# Patient Record
Sex: Female | Born: 1985 | Race: Black or African American | Hispanic: No | Marital: Single | Smoking: Former smoker
Health system: Southern US, Community
[De-identification: ages and names within clinical notes are randomized; demographics above are authoritative.]

## PROBLEM LIST (undated history)

## (undated) ENCOUNTER — Inpatient Hospital Stay (HOSPITAL_COMMUNITY): Payer: Self-pay

## (undated) DIAGNOSIS — F419 Anxiety disorder, unspecified: Secondary | ICD-10-CM

## (undated) DIAGNOSIS — A749 Chlamydial infection, unspecified: Secondary | ICD-10-CM

## (undated) DIAGNOSIS — A549 Gonococcal infection, unspecified: Secondary | ICD-10-CM

## (undated) DIAGNOSIS — F319 Bipolar disorder, unspecified: Secondary | ICD-10-CM

## (undated) DIAGNOSIS — F209 Schizophrenia, unspecified: Secondary | ICD-10-CM

## (undated) DIAGNOSIS — R87629 Unspecified abnormal cytological findings in specimens from vagina: Secondary | ICD-10-CM

## (undated) HISTORY — PX: DILATION AND CURETTAGE OF UTERUS: SHX78

## (undated) HISTORY — PX: COLPOSCOPY: SHX161

---
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
## 2007-05-31 ENCOUNTER — Inpatient Hospital Stay (HOSPITAL_COMMUNITY): Admission: AD | Admit: 2007-05-31 | Discharge: 2007-05-31 | Payer: Self-pay | Admitting: Obstetrics & Gynecology

## 2009-06-02 ENCOUNTER — Emergency Department (HOSPITAL_COMMUNITY): Admission: EM | Admit: 2009-06-02 | Discharge: 2009-06-02 | Payer: Self-pay | Admitting: Family Medicine

## 2009-11-24 ENCOUNTER — Emergency Department (HOSPITAL_COMMUNITY): Admission: EM | Admit: 2009-11-24 | Discharge: 2009-11-24 | Payer: Self-pay | Admitting: Family Medicine

## 2010-04-09 ENCOUNTER — Other Ambulatory Visit: Admission: RE | Admit: 2010-04-09 | Discharge: 2010-04-09 | Payer: Self-pay | Admitting: Family Medicine
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
## 2011-04-09 NOTE — Op Note (Signed)
NAMEANALIESE, KRUPKA            ACCOUNT NO.:  [id number]   MEDICAL RECORD NO.:  [id number]          PATIENT TYPE:  AMB   LOCATION:  SDC                           FACILITY:  [facility]   PHYSICIAN:  Genia Del, M.D.DATE OF BIRTH:  1986/09/10   DATE OF PROCEDURE:  05/19/2005  DATE OF DISCHARGE:                                 OPERATIVE REPORT   PREOPERATIVE DIAGNOSIS:  9+ weeks gestation, not desired.   POSTOPERATIVE DIAGNOSIS:  9+ weeks gestation, not desired.   INTERVENTION:  Dilatation and evacuation.   SURGEON:  Genia Del, M.D.   ANESTHESIA:  Belva Agee, M.D.   DESCRIPTION OF PROCEDURE:  Under MAC analgesia, the patient is in the  lithotomy position. She is prepped with Betadine on the suprapubic, vulvar,  and vaginal areas, and draped as usual.  The vaginal examination reveals an  anteverted uterus about 9 cm, mobile, no adnexal mass.  The cervix is long  and closed with no bleeding.  The speculum is inserted into the vagina.  The  anterior lip of the cervix is grasped with a tenaculum.  The paracervical  block is done with lidocaine 1% 20 mL total at 4 and 8 o'clock.  We then  dilated the cervix with Hegar dilators up to #31 without difficulty.  A #9  curved curet is used for suction.  Products of conception corresponding to  about 9 weeks are suctioned and sent to pathology.  We then used a sharp  curet to systematically curettage the intrauterine cavity and follow with  the suction once more to remove any products of conception or blood clots.  The uterus contracts well.  Hemostasis is adequate.  All instruments are  removed.  The estimated blood loss was 50 mL.  No complications occurred.  The patient was transferred in stable status to the recovery room.       ML/MEDQ  D:  05/19/2005  T:  05/19/2005  Job:  [id number]
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
## 2011-07-05 ENCOUNTER — Inpatient Hospital Stay (HOSPITAL_COMMUNITY): Payer: 59

## 2011-07-05 ENCOUNTER — Encounter (HOSPITAL_COMMUNITY): Payer: Self-pay | Admitting: *Deleted

## 2011-07-05 ENCOUNTER — Inpatient Hospital Stay (HOSPITAL_COMMUNITY)
Admission: AD | Admit: 2011-07-05 | Discharge: 2011-07-05 | Disposition: A | Discharge status: Home / Self Care | Payer: 59 | Source: Ambulatory Visit | Attending: Obstetrics & Gynecology | Admitting: Obstetrics & Gynecology

## 2011-07-05 DIAGNOSIS — Z3490 Encounter for supervision of normal pregnancy, unspecified, unspecified trimester: Secondary | ICD-10-CM

## 2011-07-05 DIAGNOSIS — Z1389 Encounter for screening for other disorder: Secondary | ICD-10-CM

## 2011-07-05 DIAGNOSIS — O99891 Other specified diseases and conditions complicating pregnancy: Secondary | ICD-10-CM | POA: Insufficient documentation

## 2011-07-05 LAB — URINALYSIS, ROUTINE W REFLEX MICROSCOPIC
Bilirubin Urine: NEGATIVE
Hgb urine dipstick: NEGATIVE
Ketones, ur: NEGATIVE mg/dL
Nitrite: NEGATIVE
Urobilinogen, UA: 1 mg/dL (ref 0.0–1.0)

## 2011-07-05 LAB — WET PREP, GENITAL
Clue Cells Wet Prep HPF POC: NONE SEEN
Trich, Wet Prep: NONE SEEN

## 2011-07-05 LAB — CBC
HCT: 36.5 % (ref 36.0–46.0)
MCH: 30.4 pg (ref 26.0–34.0)
MCV: 87.3 fL (ref 78.0–100.0)
Platelets: 135 10*3/uL — ABNORMAL LOW (ref 150–400)
RBC: 4.18 MIL/uL (ref 3.87–5.11)
WBC: 5.4 10*3/uL (ref 4.0–10.5)

## 2011-07-05 NOTE — ED Provider Notes (Signed)
Results for orders placed during the hospital encounter of 07/05/11 (from the past 24 hour(s))  URINALYSIS, ROUTINE W REFLEX MICROSCOPIC     Status: Normal   Collection Time   07/05/11 11:30 AM      Component Value Range   Color, Urine YELLOW  YELLOW    Appearance CLEAR  CLEAR    Specific Gravity, Urine 1.020  1.005 - 1.030    pH 7.5  5.0 - 8.0    Glucose, UA NEGATIVE  NEGATIVE (mg/dL)   Hgb urine dipstick NEGATIVE  NEGATIVE    Bilirubin Urine NEGATIVE  NEGATIVE    Ketones, ur NEGATIVE  NEGATIVE (mg/dL)   Protein, ur NEGATIVE  NEGATIVE (mg/dL)   Urobilinogen, UA 1.0  0.0 - 1.0 (mg/dL)   Nitrite NEGATIVE  NEGATIVE    Leukocytes, UA NEGATIVE  NEGATIVE   POCT PREGNANCY, URINE     Status: Normal   Collection Time   07/05/11 11:34 AM      Component Value Range   Preg Test, Ur POSITIVE    CBC     Status: Abnormal   Collection Time   07/05/11 11:45 AM      Component Value Range   WBC 5.4  4.0 - 10.5 (K/uL)   RBC 4.18  3.87 - 5.11 (MIL/uL)   Hemoglobin 12.7  12.0 - 15.0 (g/dL)   HCT 95.6  21.3 - 08.6 (%)   MCV 87.3  78.0 - 100.0 (fL)   MCH 30.4  26.0 - 34.0 (pg)   MCHC 34.8  30.0 - 36.0 (g/dL)   RDW 57.8  46.9 - 62.9 (%)   Platelets 135 (*) 150 - 400 (K/uL)  ABO/RH     Status: Normal   Collection Time   07/05/11 11:45 AM      Component Value Range   ABO/RH(D) O POS    HCG, QUANTITATIVE, PREGNANCY     Status: Abnormal   Collection Time   07/05/11 11:45 AM      Component Value Range   hCG, Beta Chain, Quant, S 52841 (*) <5 (mIU/mL)  WET PREP, GENITAL     Status: Abnormal   Collection Time   07/05/11  1:00 PM      Component Value Range   Yeast, Wet Prep NONE SEEN  NONE SEEN    Trich, Wet Prep NONE SEEN  NONE SEEN    Clue Cells, Wet Prep NONE SEEN  NONE SEEN    WBC, Wet Prep HPF POC TOO NUMEROUS TO COUNT (*) NONE SEEN     Pregnancy verification letter given. Discussed pregnancy options. Gave phone number for [facility] and [facility].   I supervised this patient's care and  agree with student's assessment and plan

## 2011-07-05 NOTE — Progress Notes (Signed)
Pt states she used the Plan B on 7-15. Has had abdominal pain, spotting on and off and nausea and vomiting for about one week. Did 4 HPT's yesterday that were POS.

## 2011-07-05 NOTE — ED Provider Notes (Signed)
History   Makayla Armstrong04-07-1986 07/05/11 12:07  Chief Complaint  Patient presents with   Abdominal Pain   HPI Comments: Ms. Makayla Medina is a 25 yo G3P1011 with a cc of abdominal pain.  The pain is located in her lower abdomen.  She states it started a week ago and describes it as sharp, immediate, and constant.  She rates it as 8-9/10 when it happens.  She has not done anything or taking any medications to alleviate it.  She is currently not in pain.  Her LMP was 05/26/11.  She also states she had light vaginal bleeding only for one day over a week ago.  Denies vaginal dc.  She states she has had HA and NV this week. She denies light headedness, dizziness, or RUQ.    Abdominal Pain Associated symptoms include headaches.     Past Medical History  Diagnosis Date   No pertinent past medical history     Past Surgical History  Procedure Date   Dilation and curettage of uterus     No family history on file.  History  Substance Use Topics   Smoking status: Never Smoker    Smokeless tobacco: Not on file   Alcohol Use: 0.6 oz/week    1 Glasses of wine per week    Allergies: Allergies not on file  No prescriptions prior to admission    Review of Systems  Eyes: Negative for blurred vision.  Gastrointestinal: Positive for abdominal pain.  Neurological: Positive for headaches. Negative for dizziness.  All other systems reviewed and are negative.   Physical Exam   Blood pressure 104/67, pulse 77, temperature 98.3 F (36.8 C), temperature source Oral, resp. rate 16, height 5\' 6"  (1.676 m), weight 65.681 kg (144 lb 12.8 oz), last menstrual period 05/26/2011, SpO2 100.00%.  Physical Exam  Constitutional: She is oriented to person, place, and time. She appears well-developed and well-nourished.  HENT:  Head: Normocephalic.  Neck: No thyromegaly present.  Cardiovascular: Normal rate, regular rhythm and normal heart sounds.   Respiratory: Effort normal and breath  sounds normal.  GI: Soft. She exhibits no distension. There is no rebound.  Genitourinary: Vaginal discharge found.       Large amount of white, mucous vaginal discharge  Musculoskeletal: She exhibits no edema and no tenderness.  Neurological: She is alert and oriented to person, place, and time.  Skin: Skin is warm and dry.    MAU Course  Procedures    Assessment and Plan  Positive pregnancy - U/S imaged IUP with yolk sac, FHR of 125, small subchorionic bleed.  EGA by U/S [redacted]w[redacted]d.  Will discharge with information on early pregnancy precautions and pregnancy verification letter. Pt also requests information on abortion options.    Payton Doughty PA-S 07/05/2011, 12:07 PM

## 2011-07-05 NOTE — ED Notes (Deleted)
Pt  Tearful, loss acknowledged.  CNM calling MD for plan.  FOB silent.

## 2011-07-06 LAB — GC/CHLAMYDIA PROBE AMP, GENITAL
Chlamydia, DNA Probe: NEGATIVE
GC Probe Amp, Genital: NEGATIVE

## 2011-09-07 LAB — URINALYSIS, ROUTINE W REFLEX MICROSCOPIC
Bilirubin Urine: NEGATIVE
Hgb urine dipstick: NEGATIVE
Ketones, ur: NEGATIVE
Nitrite: NEGATIVE
Protein, ur: NEGATIVE
Urobilinogen, UA: 2 — ABNORMAL HIGH

## 2011-10-01 ENCOUNTER — Encounter (HOSPITAL_COMMUNITY): Payer: Self-pay

## 2011-10-01 ENCOUNTER — Emergency Department (HOSPITAL_COMMUNITY)
Admission: EM | Admit: 2011-10-01 | Discharge: 2011-10-02 | Disposition: A | Discharge status: Other Acute Inpatient Hospital | Payer: 59 | Source: Home / Self Care | Attending: Emergency Medicine | Admitting: Emergency Medicine

## 2011-10-01 DIAGNOSIS — F29 Unspecified psychosis not due to a substance or known physiological condition: Secondary | ICD-10-CM

## 2011-10-01 LAB — URINALYSIS, ROUTINE W REFLEX MICROSCOPIC
Hgb urine dipstick: NEGATIVE
Nitrite: NEGATIVE
Specific Gravity, Urine: 1.021 (ref 1.005–1.030)
Urobilinogen, UA: 1 mg/dL (ref 0.0–1.0)
pH: 6.5 (ref 5.0–8.0)

## 2011-10-01 LAB — CBC
MCH: 28.4 pg (ref 26.0–34.0)
MCHC: 33.3 g/dL (ref 30.0–36.0)
MCV: 85.3 fL (ref 78.0–100.0)
Platelets: 208 10*3/uL (ref 150–400)
RDW: 13.2 % (ref 11.5–15.5)

## 2011-10-01 LAB — URINE MICROSCOPIC-ADD ON

## 2011-10-01 LAB — COMPREHENSIVE METABOLIC PANEL
ALT: 10 U/L (ref 0–35)
Albumin: 4.4 g/dL (ref 3.5–5.2)
Calcium: 10 mg/dL (ref 8.4–10.5)
GFR calc Af Amer: >90 mL/min (ref >90)
Glucose, Bld: 99 mg/dL (ref 70–99)
Potassium: 3.2 mEq/L — ABNORMAL LOW (ref 3.5–5.1)
Sodium: 138 mEq/L (ref 135–145)
Total Protein: 8.1 g/dL (ref 6.0–8.3)

## 2011-10-01 LAB — RAPID URINE DRUG SCREEN, HOSP PERFORMED
Cocaine: NOT DETECTED
Opiates: NOT DETECTED

## 2011-10-01 LAB — DIFFERENTIAL
Basophils Relative: 0 % (ref 0–1)
Eosinophils Absolute: 0 10*3/uL (ref 0.0–0.7)
Eosinophils Relative: 0 % (ref 0–5)
Lymphs Abs: 1 10*3/uL (ref 0.7–4.0)
Neutrophils Relative %: 78 % — ABNORMAL HIGH (ref 43–77)

## 2011-10-01 LAB — PREGNANCY, URINE: Preg Test, Ur: NEGATIVE

## 2011-10-01 LAB — ETHANOL: Alcohol, Ethyl (B): <11 mg/dL (ref 0–11)

## 2011-10-01 MED ORDER — LORAZEPAM 1 MG PO TABS
1.0000 mg | ORAL_TABLET | Freq: Three times a day (TID) | ORAL | Status: DC | PRN
  Administered 2011-10-02: 1 mg via ORAL
  Filled 2011-10-01 (×2): qty 1

## 2011-10-01 MED ORDER — ZOLPIDEM TARTRATE 5 MG PO TABS: 10.0000 mg | ORAL_TABLET | Freq: Every evening | ORAL | Status: DC | PRN

## 2011-10-01 MED ORDER — ACETAMINOPHEN 325 MG PO TABS: 650.0000 mg | ORAL_TABLET | ORAL | Status: DC | PRN

## 2011-10-01 MED ORDER — NICOTINE 21 MG/24HR TD PT24: 21.0000 mg | MEDICATED_PATCH | Freq: Every day | TRANSDERMAL | Status: DC

## 2011-10-01 MED ORDER — ONDANSETRON HCL 4 MG PO TABS: 4.0000 mg | ORAL_TABLET | Freq: Three times a day (TID) | ORAL | Status: DC | PRN

## 2011-10-01 MED ORDER — IBUPROFEN 200 MG PO TABS: 600.0000 mg | ORAL_TABLET | Freq: Three times a day (TID) | ORAL | Status: DC | PRN

## 2011-10-01 MED ORDER — ALUM & MAG HYDROXIDE-SIMETH 200-200-20 MG/5ML PO SUSP: 30.0000 mL | ORAL | Status: DC | PRN

## 2011-10-01 NOTE — ED Notes (Signed)
Sissy Hoff (Mother) [phone]
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
## 2011-10-01 NOTE — ED Notes (Signed)
Pt requesting to smoke, offered nicotine patch, pt declined. Awaiting BH assessment.

## 2011-10-01 NOTE — ED Notes (Signed)
ACT at bedside, pt continues to wander

## 2011-10-01 NOTE — ED Notes (Signed)
ems wwas called by [employer], pt was climbing on cars and screaming things out
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
## 2011-10-01 NOTE — ED Notes (Signed)
Family at bedside. 

## 2011-10-01 NOTE — ED Notes (Signed)
Pt. Escorted to restroom. Unable to void. Gave oral fluids. Will revisit in 10 mins.

## 2011-10-01 NOTE — ED Notes (Signed)
Pt. Currently on phone with mother. RN Autumn notified.

## 2011-10-01 NOTE — ED Notes (Signed)
Pt admits to taking three ecstasy pills and drinking heavily today, family states that she started hearing voices about three days ago. EMS said that she was talking about cutting her throat and sacrificing her daughter, ems gave her versed, pt is now calm

## 2011-10-01 NOTE — ED Notes (Signed)
Patient is resting comfortably. 

## 2011-10-01 NOTE — ED Notes (Signed)
Pt now awake, tearful, talking to NT, pt up to BR with steady gait.

## 2011-10-01 NOTE — ED Notes (Signed)
Pt requesting food, pt given sandwich and drink, female at bedside.

## 2011-10-01 NOTE — ED Notes (Signed)
UJW:[id number]<WG> Expected date:10/01/11<BR> Expected time: 3:58 PM<BR> Means of arrival:Ambulance<BR> Comments:<BR> 25 Psychosis/admits to ecstasy use

## 2011-10-01 NOTE — ED Notes (Signed)
Pt on telephone with mother at this time

## 2011-10-01 NOTE — ED Notes (Signed)
Pt again questioning wait, explained need for evaluation by ACT.

## 2011-10-02 ENCOUNTER — Inpatient Hospital Stay (HOSPITAL_COMMUNITY)
Admission: EM | Admit: 2011-10-02 | Discharge: 2011-10-08 | DRG: 885 | Disposition: A | Discharge status: Home / Self Care | Payer: 59 | Source: Ambulatory Visit | Attending: Psychiatry | Admitting: Psychiatry

## 2011-10-02 DIAGNOSIS — F1994 Other psychoactive substance use, unspecified with psychoactive substance-induced mood disorder: Secondary | ICD-10-CM

## 2011-10-02 DIAGNOSIS — F1210 Cannabis abuse, uncomplicated: Secondary | ICD-10-CM

## 2011-10-02 DIAGNOSIS — F29 Unspecified psychosis not due to a substance or known physiological condition: Principal | ICD-10-CM | POA: Diagnosis present

## 2011-10-02 MED ORDER — RISPERIDONE 1 MG PO TBDP
1.0000 mg | ORAL_TABLET | Freq: Two times a day (BID) | ORAL | Status: DC | PRN
  Administered 2011-10-02 – 2011-10-06 (×7): 1 mg via ORAL
  Filled 2011-10-02 (×8): qty 1

## 2011-10-02 NOTE — ED Notes (Signed)
Report given to Saundra, RN.

## 2011-10-02 NOTE — ED Notes (Signed)
Pt changed in to scrubs, pt and clothing wanded, belongings at nurses station, pt continues to wander around room, mumbling about God.

## 2011-10-02 NOTE — Progress Notes (Signed)
[facility] Group Notes:  (Counselor/Nursing/MHT/Case Management/Adjunct)  10/02/2011 11 AM  Type of Therapy:  Counseling Group / Dance/Movement Therapy  Participation Level:  Did Not Attend  Kym Groom 10/02/2011, 11:58 AM

## 2011-10-02 NOTE — ED Notes (Signed)
Per admission in [facility], Mr. Makayla Medina said that they will contact us back once [name] approves transport

## 2011-10-02 NOTE — Progress Notes (Signed)
Pt.  attended and participated in aftercare planning group. Suicide prevention information, warning signs to look for with suicide and crisis line numbers to use.  Pt. was crying and very emotional during the group. Unable to understand some of her conversations.
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
## 2011-10-02 NOTE — ED Notes (Signed)
Pt continues to occasionally come to desk. Awaiting BH admission

## 2011-10-02 NOTE — ED Notes (Signed)
Changed status from discharge to transfer d/t pt was transferred to [facility].

## 2011-10-02 NOTE — ED Notes (Signed)
BH contacted, report given to the RN, pt getting ready for the transport

## 2011-10-02 NOTE — Progress Notes (Signed)
Suicide Risk Assessment  Admission Assessment     Demographic factors:  Assessment Details Time of Assessment: Admission Information Obtained From: Patient Current Mental Status:    Loss Factors:  Loss Factors: Loss of significant relationship Historical Factors:    Risk Reduction Factors:  Risk Reduction Factors: Sense of responsibility to family;Employed;Positive social support  CLINICAL FACTORS:   Depression:   Comorbid alcohol abuse/dependence Alcohol/Substance Abuse/Dependencies Previous Psychiatric Diagnoses and Treatments  COGNITIVE FEATURES THAT CONTRIBUTE TO RISK:  Polarized thinking    SUICIDE RISK:   Minimal: No identifiable suicidal ideation.  Patients presenting with no risk factors but with morbid ruminations; may be classified as minimal risk based on the severity of the depressive symptoms  PLAN OF CARE: Patient admitted to [facility] with the urine drug screen positive for the benzodiazepines and marijuana and also reportedly using ecstasy. Patient was unable to provide a linear history during this evaluation. Patient was treated in the past. Patient will be receiving the antipsychotic medication Risperdal 1 mg 2 times a day as needed for agitation and aggressive behavior. Patient will be closely monitored for the withdrawal  symptoms of benzodiazepines.    Makayla Medina,JANARDHAHA R. 10/02/2011, 2:42 PM

## 2011-10-02 NOTE — Progress Notes (Addendum)
10/02/2011 @2145 : Pt resting at start of shift. She is now up and moving about the milieu with a suspicious, worried affect. She will write something in her journal, move quickly across the dayroom and then will write again. She denies any hallucinations but appears to respond to internal stimuli. She is childlike in her interactions which are minimal though pt is cooperative on approach. Supported, given reassurance of safety on unit. Pt does not report any needs at this time. Will continue to monitor closely. Edsel Petrin RN 10/03/2011 @0600 : Pt awoke at 0100 coughing and remains confused. Observed in hall at times studying the map on wall, wandering. Pt's cough is dry in quality and she denies any associated pain. Pt given fluids and encouraged to push. She asks for and is given Risperdal 1mg  M-tab to help her settle and rest. She was able to return to sleep within the hour however has had a few coughing spells since. Will ask MD to assess. Edsel Petrin RN

## 2011-10-02 NOTE — ED Notes (Signed)
Pt transported from acute side to the TCU, pt assessed, pt crying at this time, pt state she is hearing voices, when asked to explain, pt further states " They telling me to hurt my self and others" Pt asking for the phone, states needs to know about her nephew. At this time pt appears to be emotionally upset. NT in the room obtaining VS.

## 2011-10-02 NOTE — BH Assessment (Signed)
Assessment Note   Makayla Medina is an 25 y.o. female.   Axis I: Psychotic Disorder NOS  Past Medical History:  Past Medical History  Diagnosis Date  . No pertinent past medical history     Past Surgical History  Procedure Date  . Dilation and curettage of uterus     Family History: History reviewed. No pertinent family history.  Social History:  reports that she has never smoked. She does not have any smokeless tobacco history on file. She reports that she drinks about .6 ounces of alcohol per week. She reports that she uses illicit drugs (Marijuana).  Allergies: No Known Allergies  Home Medications:  Medications Prior to Admission  Medication Dose Route Frequency Provider Last Rate Last Dose  . acetaminophen (TYLENOL) tablet 650 mg  650 mg Oral Q4H PRN Makayla Givens, MD      . alum & mag hydroxide-simeth (MAALOX/MYLANTA) 200-200-20 MG/5ML suspension 30 mL  30 mL Oral PRN Makayla Givens, MD      . ibuprofen (ADVIL,MOTRIN) tablet 600 mg  600 mg Oral Q8H PRN Makayla Givens, MD      . LORazepam (ATIVAN) tablet 1 mg  1 mg Oral Q8H PRN Makayla Givens, MD   1 mg at 10/02/11 0158  . nicotine (NICODERM CQ - dosed in mg/24 hours) patch 21 mg  21 mg Transdermal Daily Makayla Givens, MD      . ondansetron (ZOFRAN) tablet 4 mg  4 mg Oral Q8H PRN Makayla Givens, MD      . zolpidem (AMBIEN) tablet 10 mg  10 mg Oral QHS PRN Makayla Givens, MD       No current outpatient prescriptions on file as of 10/01/2011.    OB/GYN Status:  Patient's last menstrual period was 05/26/2011.  General Assessment Data Living Arrangements: Alone;Children Can pt return to current living arrangement?: Yes Admission Status: Voluntary Is patient capable of signing voluntary admission?: Yes Transfer from: Acute Hospital Referral Source: Self/Family/Friend  Risk to self Suicidal Ideation: Yes-Currently Present Suicidal Intent: Yes-Currently Present Is patient at risk for suicide?: Yes Suicidal Plan?: Yes-Currently  Present Specify Current Suicidal Plan:  (Pt. stated she "was going to take 13 pills") Access to Means: Yes Specify Access to Suicidal Means:  (Pt. can obtain medications from the community.) What has been your use of drugs/alcohol within the last 12 months?: Alcohol and THC Other Self Harm Risks:  (None reported) Triggers for Past Attempts: Unknown Intentional Self Injurious Behavior: None Factors that decrease suicide risk: Positive social support (Sense of responsibiltiy to faimly) Family Suicide History: Unknown Recent stressful life event(s): Other (Comment) (Current drug  use "Ecstasy" ) Persecutory voices/beliefs?: Yes (Hurt herself) Depression: Yes Depression Symptoms: Tearfulness;Feeling angry/irritable;Loss of interest in usual pleasures Suicide prevention information given to non-admitted patients: Yes  Risk to Others Homicidal Ideation: No Thoughts of Harm to Others: No Current Homicidal Intent: No Current Homicidal Plan: No Access to Homicidal Means: No Identified Victim:  (NA) History of harm to others?: No Assessment of Violence: On admission (Pt. was brought to the ED in restraints by [employer]) Does patient have access to weapons?: No Criminal Charges Pending?: No Does patient have a court date: No  Mental Status Report Eye Contact: Poor Motor Activity: Restlessness Speech: Rapid;Slurred;Incoherent;Aphasic Level of Consciousness: Irritable;Alert Mood: Suspicious;Anxious;Preoccupied;Depressed Affect: Apathetic;Depressed;Fearful;Inconsistent with thought content;Preoccupied Anxiety Level: Moderate Thought Processes: Irrelevant;Flight of Ideas Judgement: Impaired Orientation: Person;Place;Time;Situation Obsessive Compulsive Thoughts/Behaviors: Moderate  Cognitive Functioning Concentration: Decreased Memory: Recent Intact;Remote Intact  IQ: Average Insight: Poor Impulse Control: Fair Appetite: Good Weight Loss:  (na) Weight Gain:  (na) Sleep: Decreased Total  Hours of Sleep:  (Pt. didn't answer) Vegetative Symptoms: None  Prior Inpatient/Outpatient Therapy Prior Therapy: Inpatient Prior Therapy Dates: 2006 Prior Therapy Facilty/Provider(s): [facility] Reason for Treatment: Suicide attempt          Abuse/Neglect Assessment (Assessment to be complete while patient is alone) Physical Abuse: Denies Verbal Abuse: Denies Sexual Abuse: Denies Exploitation of patient/patient's resources: Denies Self-Neglect: Denies Values / Beliefs Cultural Requests During Hospitalization: None Spiritual Requests During Hospitalization: Religious literature (Pt. requested a Bible earlier in the day) Consults Spiritual Care Consult Needed: No Social Work Consult Needed: No      Additional Information 1:1 In Past 12 Months?: No CIRT Risk: No Elopement Risk: No Does patient have medical clearance?: Yes  Child/Adolescent Assessment Running Away Risk: Denies Bed-Wetting: Denies Destruction of Property: Denies Cruelty to Animals: Denies Stealing: Denies Rebellious/Defies Authority: Denies Satanic Involvement: Denies Archivist: Denies Problems at [employer]: Denies Gang Involvement: Denies  Disposition: Pt. Was accepted to [facility] by Makayla Medina to Makayla Medina and her bed number is 400-2 Disposition Disposition of Patient: Inpatient treatment program Type of inpatient treatment program: Adult  On Site Evaluation by:   Reviewed with Physician:     Makayla Medina 10/02/2011 3:44 AM
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
## 2011-10-02 NOTE — H&P (Signed)
Makayla Medina is an 25 y.o. female.   Chief Complaint: altered mental status  HPI: Came to the ED and can't answer questions at this time. Was reported to have taken ecstasy and appears to be having a "bad trip".  Past Medical History  Diagnosis Date  . No pertinent past medical history     Past Surgical History  Procedure Date  . Dilation and curettage of uterus     No family history on file. Social History:  reports that she has never smoked. She does not have any smokeless tobacco history on file. She reports that she drinks about .6 ounces of alcohol per week. She reports that she uses illicit drugs (Marijuana).  Allergies: No Known Allergies  Medications Prior to Admission  Medication Dose Route Frequency Provider Last Rate Last Dose  . DISCONTD: acetaminophen (TYLENOL) tablet 650 mg  650 mg Oral Q4H PRN Ward Givens, MD      . DISCONTD: alum & mag hydroxide-simeth (MAALOX/MYLANTA) 200-200-20 MG/5ML suspension 30 mL  30 mL Oral PRN Ward Givens, MD      . DISCONTD: ibuprofen (ADVIL,MOTRIN) tablet 600 mg  600 mg Oral Q8H PRN Ward Givens, MD      . DISCONTD: LORazepam (ATIVAN) tablet 1 mg  1 mg Oral Q8H PRN Ward Givens, MD   1 mg at 10/02/11 0158  . DISCONTD: nicotine (NICODERM CQ - dosed in mg/24 hours) patch 21 mg  21 mg Transdermal Daily Ward Givens, MD      . DISCONTD: ondansetron (ZOFRAN) tablet 4 mg  4 mg Oral Q8H PRN Ward Givens, MD      . DISCONTD: zolpidem (AMBIEN) tablet 10 mg  10 mg Oral QHS PRN Ward Givens, MD       Medications Prior to Admission  Medication Sig Dispense Refill  . midazolam (VERSED) 2 MG/ML syrup Take 5 mg by mouth once.         Results for orders placed during the hospital encounter of 10/01/11 (from the past 48 hour(s))  CBC     Status: Normal   Collection Time   10/01/11  5:35 PM      Component Value Range Comment   WBC 8.5  4.0 - 10.5 (K/uL)    RBC 4.50  3.87 - 5.11 (MIL/uL)    Hemoglobin 12.8  12.0 - 15.0 (g/dL)    HCT 16.1  09.6 - 04.5  (%)    MCV 85.3  78.0 - 100.0 (fL)    MCH 28.4  26.0 - 34.0 (pg)    MCHC 33.3  30.0 - 36.0 (g/dL)    RDW 40.9  81.1 - 91.4 (%)    Platelets 208  150 - 400 (K/uL)   DIFFERENTIAL     Status: Abnormal   Collection Time   10/01/11  5:35 PM      Component Value Range Comment   Neutrophils Relative 78 (*) 43 - 77 (%)    Neutro Abs 6.6  1.7 - 7.7 (K/uL)    Lymphocytes Relative 12  12 - 46 (%)    Lymphs Abs 1.0  0.7 - 4.0 (K/uL)    Monocytes Relative 10  3 - 12 (%)    Monocytes Absolute 0.8  0.1 - 1.0 (K/uL)    Eosinophils Relative 0  0 - 5 (%)    Eosinophils Absolute 0.0  0.0 - 0.7 (K/uL)    Basophils Relative 0  0 - 1 (%)    Basophils  Absolute 0.0  0.0 - 0.1 (K/uL)   COMPREHENSIVE METABOLIC PANEL     Status: Abnormal   Collection Time   10/01/11  5:35 PM      Component Value Range Comment   Sodium 138  135 - 145 (mEq/L)    Potassium 3.2 (*) 3.5 - 5.1 (mEq/L)    Chloride 102  96 - 112 (mEq/L)    CO2 25  19 - 32 (mEq/L)    Glucose, Bld 99  70 - 99 (mg/dL)    BUN 10  6 - 23 (mg/dL)    Creatinine, Ser 7.82  0.50 - 1.10 (mg/dL)    Calcium 95.6  8.4 - 10.5 (mg/dL)    Total Protein 8.1  6.0 - 8.3 (g/dL)    Albumin 4.4  3.5 - 5.2 (g/dL)    AST 19  0 - 37 (U/L)    ALT 10  0 - 35 (U/L)    Alkaline Phosphatase 67  39 - 117 (U/L)    Total Bilirubin 0.5  0.3 - 1.2 (mg/dL)    GFR calc non Af Amer 86 (*) >90 (mL/min)    GFR calc Af Amer >90  >90 (mL/min)   ETHANOL     Status: Normal   Collection Time   10/01/11  5:35 PM      Component Value Range Comment   Alcohol, Ethyl (B) <11  0 - 11 (mg/dL)   URINALYSIS, ROUTINE W REFLEX MICROSCOPIC     Status: Abnormal   Collection Time   10/01/11  6:14 PM      Component Value Range Comment   Color, Urine YELLOW  YELLOW     Appearance CLOUDY (*) CLEAR     Specific Gravity, Urine 1.021  1.005 - 1.030     pH 6.5  5.0 - 8.0     Glucose, UA NEGATIVE  NEGATIVE (mg/dL)    Hgb urine dipstick NEGATIVE  NEGATIVE     Bilirubin Urine NEGATIVE  NEGATIVE      Ketones, ur 40 (*) NEGATIVE (mg/dL)    Protein, ur 213 (*) NEGATIVE (mg/dL)    Urobilinogen, UA 1.0  0.0 - 1.0 (mg/dL)    Nitrite NEGATIVE  NEGATIVE     Leukocytes, UA NEGATIVE  NEGATIVE    PREGNANCY, URINE     Status: Normal   Collection Time   10/01/11  6:14 PM      Component Value Range Comment   Preg Test, Ur NEGATIVE     URINE MICROSCOPIC-ADD ON     Status: Abnormal   Collection Time   10/01/11  6:14 PM      Component Value Range Comment   Squamous Epithelial / LPF RARE  RARE     WBC, UA 0-2  <3 (WBC/hpf)    Bacteria, UA FEW (*) RARE     Urine-Other MUCOUS PRESENT     URINE RAPID DRUG SCREEN (HOSP PERFORMED)     Status: Abnormal   Collection Time   10/01/11  6:32 PM      Component Value Range Comment   Opiates NONE DETECTED  NONE DETECTED     Cocaine NONE DETECTED  NONE DETECTED     Benzodiazepines POSITIVE (*) NONE DETECTED     Amphetamines NONE DETECTED  NONE DETECTED     Tetrahydrocannabinol POSITIVE (*) NONE DETECTED     Barbiturates NONE DETECTED  NONE DETECTED     No results found.  Review of Systems  Constitutional: Negative.   HENT: Negative.  Eyes: Negative.   Respiratory: Negative.   Cardiovascular: Negative.   Gastrointestinal: Negative.   Genitourinary: Negative.   Musculoskeletal: Negative.   Skin: Negative.   Psychiatric/Behavioral: Positive for hallucinations.    Last menstrual period 05/26/2011. Physical Exam  Reviewed exam from ED Labs showed neg pregnancy and pops THC and benzoes also said she had used Ecstasy Is responding to internal stimuli as well as the leaves falling. Does not answer questions just spouts a lot of end of the world type things.   Assessent/Plan Observe and provide supportive care 10/02/2011, 1:35 PM

## 2011-10-02 NOTE — Progress Notes (Signed)
Pt is 25 year old female admitted with psychotic symptoms and depression   She is very anxious and tearful   Walks around the room crying out   She asks the same question over and over again   She is tearful and religiously focused   She needs frequent redirection and reassurance   She is having much difficulty answering questions during assessment  And is having flight of ideas   Verbal support given  Oriented to the unit   Will administer medications as ordered and educate on same and evaluate for theraputic effect    Provide education on diet, activities, hyegine ,and ADL's   Q 15 min checks   Pt safe at present

## 2011-10-03 MED ORDER — RISPERIDONE 1 MG PO TBDP
1.0000 mg | ORAL_TABLET | Freq: Every day | ORAL | Status: DC
  Administered 2011-10-03 – 2011-10-04 (×2): 1 mg via ORAL
  Filled 2011-10-03 (×3): qty 1

## 2011-10-03 NOTE — Progress Notes (Signed)
10/03/11 @2300 : Pt continues to remain restless, intrusive at times and bizarre. She is asking repeatedly about discharge. Her interactions are superficial and childlike at times. She continues to deny any problems - AVH, SI/HI, pain - stating "everything is fine ma'am." She is observed however responding to internal stimuli and remains disorganized. Risperdal M-tab 1mg  ordered today along with her prn doses. Given at 2200. Level III obs continued and redirection and support given as needed. Pt is safe. Edsel Petrin RN  10/04/11 @0630 : Pt in hallway and pushed CIRT button at about 2420. Observed at end of hallway looking through exit door.  She then went into her bathroom and pushed her nurse call. She denies needing anything but is clearly anxious and restless. Risperdal M-tab 1mg  given for agitation @2430 . On R/A pt was calmer, resting in bed. However, pt was up frequently in room coughing. Fluids given and encouraged. Pt slept about 4.5 hours and is presently showering.  Edsel Petrin RN

## 2011-10-03 NOTE — Progress Notes (Signed)
[facility] Group Notes:  (Counselor/Nursing/MHT/Case Management/Adjunct)  10/03/2011 11:00AM  Type of Therapy:  Counseling Group / Dance/Movement Therapy  Participation Level:  Minimal  Participation Quality:  Redirectable and Sharing  Affect:  Excited and Tearful  Cognitive:  Oriented  Insight:  Limited  Engagement in Group:  Limited  Engagement in Therapy:  Limited  Modes of Intervention:  Activity, Clarification, Education, Socialization and Support  Summary of Progress/Problems: Group discussed different types of supports and how to rely on those sources when feeling stuck. Pt. Identified her twin sister as an external support because she is with pt. Through good and bad times. Pt. left group for a short time, but she came back. Group also practiced how to support themselves at any moment by bringing awareness of what their body was doing and/or how their body was feeling. Pt. Appeared "spacey" because her speech and the quality of her movement was soft and indirect suggesting disorganization in thought and presence.    Byrant Valent 10/03/2011, 12:11 PM

## 2011-10-03 NOTE — Progress Notes (Signed)
Pt has been flighty and impulsive today    She packed her belongings up and demanded to be let off the unit and said that God told her she was to be discharged from the hospital   She is seen in the hallways and her room responding to internal stimuli  Her anxiety level has decreased form yesterday and since she has received medication   Pt has a short attention span and has difficulty sitting through groups   Verbal support given  Medications administered and effectiveness monitored  Educate on medications, discharge planning ,diet and treatment procedures  Provide pt with diversional activities   Q 15 min checks  Pt safe at present

## 2011-10-03 NOTE — Progress Notes (Signed)
  Makayla Medina is in bed. Tells me she had taken some ecstasy prior to this admission. She states she is feeling better but unable to provide more details. She denies any suicidal or homicidal thinking or psychotic symptoms. She has her eyes closed and soft spoken. She had received Risperdal last evening for psychotic symptoms.  Plan: Risperdal 1mg  QHS. TSH. Reality testing . Monitor behavior

## 2011-10-04 DIAGNOSIS — F29 Unspecified psychosis not due to a substance or known physiological condition: Principal | ICD-10-CM

## 2011-10-04 NOTE — Progress Notes (Signed)
[facility] Group Notes:  (Counselor/Nursing/MHT/Case Management/Adjunct)  10/04/2011 2:43 PM  Type of Therapy:  Group Therapy  Participation Level:  Active  Participation Quality:  Attentive and Sharing  Affect:  Depressed  Cognitive:  Confused  Insight:  Limited  Engagement in Group:  Good  Engagement in Therapy:  Good  Modes of Intervention:  Clarification, Education, Support and Exploration  Summary of Progress/Problems: Patient was clear through most of session. She stated that she had been hearing voices and this is what caused her to go running through the complex. She stated that she thought she had to get votes to be the 2012 model of the year. She remembers parts of what she was doing but knows that it was not logical. Describes it as a break down. She has only recently started hearing voices. Stated they have decreased since being in the hospital. Concerned that she was doing all this with her 40 year old daughter and that she might have scared her. She is not worried about daughter currently because she knows she is being taken care of by grandparents. Wants to work on herself. The previous information was oriented but she again started talking about the 2013 model situation as if it were real. Agreeable to taking medications.    HartisAram Beecham 10/04/2011, 2:43 PM

## 2011-10-04 NOTE — Progress Notes (Signed)
  Pt was pleasant and cooperative, during the assessment process.  Pt was however pacing the room while holding the bible. Informed the writer that she was reading Revelations and which chapter she was on.  Later pt came into the dayroom walking fast. Asked the writer if Jews, Hebrews and Gentiles are the same. Writer informed the pt that Clinical research associate would not be able to discuss religion, because interpretations are different. Pt accepted this explanation without incidence.

## 2011-10-04 NOTE — Progress Notes (Signed)
Patient is still guarded, responding to inner stimuli, depressed,unable to discuss her issues but reported decrease in voices and meds helping her, her affect is constricted. No side effects reported.
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
## 2011-10-04 NOTE — Discharge Planning (Signed)
Met with patient in Aftercare Planning Group, where she was quiet, cooperative, but did keep moving from one chair to another throughout group.  She reports that she lives with her mother, brother, and sister.  She has never had a psychiatrist, and would be very interested in counseling.  Referral to be explored.  Supposed to start a job this coming Wednesday at [employer] so may need help contacting them if still in hospital.  Supposed to have attorney appointment Tuesday at [employer].  Case Manager supplied her with the telephone number so she can contact them.  There are no specific case management needs today.  Ambrose Mantle, LCSW 10/04/2011, 12:08 PM

## 2011-10-05 LAB — TSH: TSH: 0.337 u[IU]/mL — ABNORMAL LOW (ref 0.350–4.500)

## 2011-10-05 MED ORDER — GUAIFENESIN 100 MG/5ML PO SOLN
10.0000 mL | Freq: Four times a day (QID) | ORAL | Status: DC | PRN
  Administered 2011-10-05 – 2011-10-08 (×7): 200 mg via ORAL

## 2011-10-05 MED ORDER — MAGNESIUM HYDROXIDE 400 MG/5ML PO SUSP: 30.0000 mL | Freq: Every day | ORAL | Status: DC | PRN

## 2011-10-05 MED ORDER — ACETAMINOPHEN 325 MG PO TABS
650.0000 mg | ORAL_TABLET | Freq: Four times a day (QID) | ORAL | Status: DC | PRN
Start: 2011-10-05 — End: 2011-10-08

## 2011-10-05 MED ORDER — RISPERIDONE 2 MG PO TBDP
3.0000 mg | ORAL_TABLET | Freq: Every day | ORAL | Status: DC
  Administered 2011-10-05 – 2011-10-07 (×3): 3 mg via ORAL
  Filled 2011-10-05 (×4): qty 1

## 2011-10-05 MED ORDER — ALUM & MAG HYDROXIDE-SIMETH 200-200-20 MG/5ML PO SUSP: 30.0000 mL | ORAL | Status: DC | PRN

## 2011-10-05 NOTE — Progress Notes (Signed)
ON 10/05/2011  As per RN Pt was  pacing the room while holding the bible. Informed the Clinical research associate that she was reading Revelations and which chapter she was on  And asked the RN if Jews, Hebrews and Gentiles are the same. Increased her Risperdal to 3mg  po qhs Patient affect is better today she can make conversation,denies AVH but still grandiose,less disorganized Denies S/H ideations. Slept fairly at night.  C/O cough will give her Robitussin

## 2011-10-05 NOTE — Progress Notes (Signed)
  Pt was pleasant and cooperative but slightly guarded during the assessment.  However, during group the pt opened up more. Explained that today was a good day because two things happened. 1) she "dropped her boyfriend". 2) her mom and sister visited and together they made plans for pt after discharge.  Pt stated that she plans to go back to school, and study cultural anthropology. Stated she attended college for 3 yrs in [place], but plans to transfer to [employer]. Pt does have a 44yr daughter who she plans to visit in Dec
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
## 2011-10-05 NOTE — Discharge Planning (Signed)
Met with Patient in Aftercare Planning Group.  She got up at one point to spit up into a trashcan.  She complained of not sleeping well due to coughing.  Was softspoken, cooperative.  Had not yet called [employer] re missing appt today, but states will call today.  Signed consent for Case Manager to contact new employer re missing orientation which starts tomorrow 11/14.  Case Manager did this, and this will not affect her employment there, as the orientation can be rescheduled.  Patient states we can be in contact with her mother Sissy Hoff [phone].  Ambrose Mantle, LCSW 10/05/2011, 9:29 AM

## 2011-10-05 NOTE — Progress Notes (Signed)
Pt was in her room upon first assessment.  She was given her self-inventory to fill out which she rated both depression and hopelessness a 1 today.  She denied any voices this morning but is still religiously preoccupied at times and appears anxious.  She did write that she "planned to pray every time I'm feeling like I can't do this"  In response to what do you plan to change/or do different when you go home to take better care of yourself.  She has been up for her groups today and has interacted fairly well.  She is still restless in groups and does move from place to place or will either get coffee or water to drink.  She does this even after she has been told the group rule about distractions and so on.  She came back from lunch and asked for medication to "go to sleep"  reminded her it was 1250 in the afternoon  and group would be starting in about 30 minutes.  She seem anxious but was not ready for a prn dose of risperdal she has only one left for the day.  Just got off the phone with Veto Kemps the counselor and had been speaking with pt right before the 1315 group.  Aram Beecham said that pt does not want her boyfriend to visit anymore as she is not sure if she wants to continue their relationship.  Informed Aram Beecham that this Clinical research associate believes that anyone with her code would be allowed to visit and her code may need to be changed.  Call placed to the [facility] the CM and she is going to find out how to handle the situation.  Pt does have new orders to increase her risperdal to 3 mg at hs and robitussin for cough prn which was given around 1155.

## 2011-10-05 NOTE — Progress Notes (Signed)
[facility] Group Notes:  (Counselor/Nursing/MHT/Case Management/Adjunct)  10/05/2011 1:54 PM  Type of Therapy:  Group Therapy  Participation Level:  Active  Participation Quality:  Attentive and Sharing  Affect:  Depressed  Cognitive:  Confused  Insight:  Limited  Engagement in Group:  Good  Engagement in Therapy:  Good  Modes of Intervention:  Clarification, Education, Orientation, Support and Exploration  Summary of Progress/Problems: Patient stated that she was still hearing voices but they were decreased and she felt she had more control. Stated that she feels better with more purpose. She is less stressed over financial concerns and feels that she is more directed. She talked about just walking around or driving around with no purpose. Now she can do things with more intent. Reviewed with her need to take her medications for clearer thinking and more reality based.   Stepahnie Campo, Aram Beecham 10/05/2011, 1:54 PM

## 2011-10-06 LAB — TSH: TSH: 0.401 u[IU]/mL (ref 0.350–4.500)

## 2011-10-06 NOTE — Progress Notes (Signed)
  Makayla Medina is a 25 y.o. female [id number] [birth year]  Subjective/Objective: Patient is doing better.  She denies audiovisual hallucinations.  She denies suicidal/homicidal ideation.  She is less disorganized but she still has grandiose delusions and is religiously preoccupied.  She is still thinking that she is here for the Rapture.  No agitation reported by the staff.  She is participating in the groups.  She got a PRN for sleep last night.    History   Social History  . Marital Status: Single    Spouse Name: N/A    Number of Children: N/A  . Years of Education: N/A   Social History Main Topics  . Smoking status: Never Smoker   . Smokeless tobacco: Not on file  . Alcohol Use: 0.6 oz/week    1 Glasses of wine per week  . Drug Use: Yes    Special: Marijuana  . Sexually Active: Yes   Other Topics Concern  . Not on file   Social History Narrative  . No narrative on file     Lab Results:   BMET    Component Value Date/Time   NA 138 10/01/2011 1735   K 3.2* 10/01/2011 1735   CL 102 10/01/2011 1735   CO2 25 10/01/2011 1735   GLUCOSE 99 10/01/2011 1735   BUN 10 10/01/2011 1735   CREATININE 0.92 10/01/2011 1735   CALCIUM 10.0 10/01/2011 1735   GFRNONAA 86* 10/01/2011 1735   GFRAA >90 10/01/2011 1735    Medications:  Scheduled:     . risperiDONE  3 mg Oral QHS     PRN Meds acetaminophen, alum & mag hydroxide-simeth, guaiFENesin, magnesium hydroxide, risperiDONE   Plan: No side effects reported from Risperdal.  We will repeat her potassium levels in the AM.  ELOS 2-3 days.     AHLUWALIA,SHAMSHER S 10/06/2011

## 2011-10-06 NOTE — Progress Notes (Signed)
Pt was in dayroom upon first assessment this morning lying on the sofa with her pillow and cover.  She had just came back from breakfast.  She was given her self-inventory to fill out.  She rated both her depression and hopelessness a 1 today.  She denies any A/V hallucinations or S/H ideation.   She did writ on there that she planned "stay on out-patient treatment plan to help make sure I don't have another breakdown"  She reports that she feels her thinking is getting better and that is able to distinguish between "what is real or not"  She is asking to go home today or by tomorrow that she has the support of her family and she feels she will be okay now.  She plans to talk with Dr. Kathrene Bongo.  She stated, "he told me yesterday one or two days more and this is one day" She smiled as she was speaking.  She has been appropriate in the milieu thus far but still seems a bit anxious and restless.  We discussed why she took the prn dose of risperdal last night around 1230 am.  She stated, "I couldn't sleep my mind was ready but my body wasn't"  She was very drowsy this morning but that has seemed to get better as day has passed.  She did request robitussin around 0909 for cough so given with relief reported.  She still plans to live with family at discharge.
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
## 2011-10-06 NOTE — Discharge Planning (Signed)
Met with patient in Aftercare Planning Group.  She expressed readiness for discharge but was very drowsy, hard to stay awake.  She agreed to a referral to [employer], which Case Manager did.  Awaiting response.  Ambrose Mantle, LCSW 10/06/2011, 2:26 PM

## 2011-10-07 LAB — COMPREHENSIVE METABOLIC PANEL
ALT: 13 U/L (ref 0–35)
AST: 19 U/L (ref 0–37)
Albumin: 4.1 g/dL (ref 3.5–5.2)
Alkaline Phosphatase: 55 U/L (ref 39–117)
CO2: 25 mEq/L (ref 19–32)
Chloride: 102 mEq/L (ref 96–112)
Potassium: 3.7 mEq/L (ref 3.5–5.1)
Sodium: 136 mEq/L (ref 135–145)
Total Bilirubin: 0.2 mg/dL — ABNORMAL LOW (ref 0.3–1.2)

## 2011-10-07 NOTE — Progress Notes (Signed)
  Pt was pleasant and cooperative. Appeared to be clearer today than previous days. Pt remembered the writers bday and had appropriate conversation.  Discussed pt's drowsiness this a.m. that  was reported to the RN during report. Support and encouragement were offered.

## 2011-10-07 NOTE — Progress Notes (Signed)
Pt is appropriate and pleasant   She is hopeful and making positive statements about her pending discharge in the morning  Pt attended groups and participates appropriately    Her interactions with others are appropriate   Verbal su-pport givemn  Medications administered and effectiveness monitored   Q 15 min checks   Pt safe

## 2011-10-07 NOTE — Discharge Planning (Signed)
Patient attended Aftercare Planning Group, is less drowsy today, goal-oriented and logical.  Follow-up appointment set with [facility], Breck Coons for intake on 10/13/11 at 1pm.  They will then set medication management appointment with doctor for the following week.  Likely D/C on 10/08/11.  Ambrose Mantle, LCSW 10/07/2011, 2:38 PM

## 2011-10-07 NOTE — Progress Notes (Signed)
[facility] Group Notes:  (Counselor/Nursing/MHT/Case Management/Adjunct)  10/07/2011 2:18 PM  Type of Therapy:  1:15PM Music Therapy  Participation Level:  Active  Participation Quality:  Appropriate, Attentive and Sharing  Affect:  Appropriate  Cognitive:  Alert and Appropriate  Insight:  Good  Engagement in Group:  Good  Engagement in Therapy:  Good  Modes of Intervention:  Education, Support and Exploration and Music  Summary of Progress/Problems: Patient was actively engaged in the music relaxation techniques. Patient used the music as a method to guided imagery to explain the times she [place]. She said she enjoyed using the music as a way to "escape" and she said it could be a techniques used once discharged from the hospital. The music also helped Joby go to a time in her life when she and her daughter spent at the beach and did water sports. She described as "the best mother-daughter I could have ever experienced with my daughter".      Wilmon Arms, Intern 10/07/2011, 2:18 PM Cosigned by Veto Kemps

## 2011-10-07 NOTE — Progress Notes (Signed)
Pt is pleasant and cooperative. Pt complains of a cough and was given medication. Pt is feeling better and states "I am ready to go home". Pt will be D/C tomorrow. Pt attends groups and interacts well with peers and stay. Pt Pt offered support and encouragement. Pt receptive to treatment and safety maintained on unit.

## 2011-10-07 NOTE — Progress Notes (Signed)
[facility] Group Notes:  (Counselor/Nursing/MHT/Case Management/Adjunct)  10/07/2011 11:02 AM  Type of Therapy:  Group Therapy  Participation Level:  Active  Participation Quality:  Appropriate, Attentive and Sharing  Affect:  Appropriate  Cognitive:  Alert and Appropriate  Insight:  Good  Engagement in Group:  Good  Engagement in Therapy:  Good  Modes of Intervention:  Clarification, Education and Exploration  Summary of Progress/Problems: Patient discussed during group being able to accept reality. She said she is looking forward to following up at [facility]. She also mentioned and directed to one of her peers that they should allow people to help you who may not necessarily be your family, but who may still care to give you the help you need. She mentioned she plans to focus on doing "small" things for herself. She realized she has never gotten a massage before and looks forward to getting one once she is discharged. She also explained that she use to want to be a contestant for [employer], but has realized it is more important to focus on her well-being than fame and materialistic things.    Wilmon Arms, Intern 10/07/2011, 11:02 AM Cosigned by Veto Kemps

## 2011-10-08 DIAGNOSIS — F29 Unspecified psychosis not due to a substance or known physiological condition: Secondary | ICD-10-CM | POA: Diagnosis present

## 2011-10-08 MED ORDER — RISPERIDONE 3 MG PO TBDP: 3.0000 mg | ORAL_TABLET | Freq: Every day | ORAL | Status: DC

## 2011-10-08 MED ORDER — RISPERIDONE 3 MG PO TABS
3.0000 mg | ORAL_TABLET | Freq: Every day | ORAL | Status: DC
  Filled 2011-10-08 (×2): qty 14

## 2011-10-08 MED ORDER — RISPERIDONE 3 MG PO TABS: 3.0000 mg | ORAL_TABLET | Freq: Every day | ORAL | Status: AC

## 2011-10-08 NOTE — Progress Notes (Signed)
Patient excited about discharge today. Denied any SI/HI or a/v hallucinations. Baseline of functioning at present. Treatment team met and felt patient ready for discharge. Obtained all belongings, prescriptions, medications, and follow up appointment.

## 2011-10-08 NOTE — Discharge Summary (Signed)
Physician Discharge Summary  Patient ID: Makayla Medina MRN: [id number] DOB/AGE: 1986/08/06 25 y.o.  Admit date: 10/02/2011 Discharge date: 10/08/2011   Hospital Course: 25 year old female who was admitted on [address] for delusional behavior and disorganized thought process. Patient was reporting that she came here to rapture the patient's as the word is going to end. Patient also reported hearing voices at the time of admission. Patient was started on Risperdal which was slowly titrated to 3 mg at bedtime patient responded to medication well without any side effects. Patient participated in all the groups no agitation reported by the staff. Patient mother was contacted and at the time of discharge she was not having any safety concerns by the patient. Psychoeducation given to the patient regarding compliance with the treatment in the outpatient setting. Side effects discussed benefits of the medication discussed with the patient. At the time of discharge patient is ready logical and goal-directed denies hearing any voices reported her anxiety level I/X. Patient denies suicidal or homicidal ideations. Her insight and judgment is better. Her delusional thinking about raptureing the patient is reduced to the extent that now she's saying that god is going to take care of off it it's not in in her hand.  Discharge Diagnoses:  Principal Problem:  *Psychosis NOS.   Discharged Condition: Stable   Discharge Orders    Future Orders Please Complete By Expires   Diet - low sodium heart healthy      Diet - low sodium heart healthy        Current Discharge Medication List    START taking these medications   Details  risperiDONE (RISPERDAL) 3 MG tablet Take 1 tablet (3 mg total) by mouth at bedtime. Qty: 30 tablet, Refills: 0      STOP taking these medications     midazolam (VERSED) 2 MG/ML syrup        Follow-up Information    Follow up with Breck Coons at [facility] on 10/13/2011. (Appointment scheduled at 1:00PM, intake worker will schedule appointment for the following week with doctor)    Contact information:   [address], [place], Telephone:  [phone]         Signed: Katheren Puller 10/08/2011, 10:57 AM

## 2011-10-08 NOTE — Discharge Planning (Signed)
Met with patient in Aftercare Planning Group.  Patient expressed readiness for discharge.  Denied suicidal ideation, homicidal ideation and auditory/visual hallucinations.  Follow-up arranged as follows:  [facility] intake on 10/13/11 at 1pm with Breck Coons.  Patient will be going to live with her mother, sister and brother.  Transportation will be provided by family members.  She states that she will have no problem getting her medication with her insurance.  There are no remaining barriers to discharge.  Ambrose Mantle, LCSW  10/08/2011 9:44 AM

## 2011-10-08 NOTE — Discharge Summary (Signed)
Discharge Note  Patient:  Makayla Medina is an 25 y.o., female DOB:  02-06-1986  Date of Admission:  10/02/2011  Date of Discharge:  10/08/2011  Axis Diagnosis:  Axis I: Psychotic Disorder NOS  Level of Care:  OP  Discharge destination:  Home  Is patient on multiple antipsychotic therapies at discharge:  No    Has Patient had three or more failed trials of antipsychotic monotherapy by history:  No  Patient phone:  [phone] (home)  Patient address:   [address] [place] [address],   Makayla Medina 10/08/2011, 9:28 AM

## 2011-10-08 NOTE — Tx Team (Signed)
Interdisciplinary Treatment Plan Update (Adult)  Date:  10/08/2011  Time Reviewed:  9:45 AM   Progress in Treatment: Attending groups: Yes. and As evidenced by:  documented in chart Participating in groups:  Yes. and As evidenced by:  full participation Taking medication as prescribed:  Yes. and As evidenced by:  no refusals noted Tolerating medication:  Yes. and As evidenced by:  no reports of side effects, none noted by staff Family/Significant othe contact made:  Yes, individual(s) contacted:  sister, mother Patient understands diagnosis:  Yes. and As evidenced by:  asked about diagnosis, understood that it will be more defined in outpatient setting Discussing patient identified problems/goals with staff:  Yes. and As evidenced by:  full disclosure, all issues addressed Medical problems stabilized or resolved:  Yes.  No medical issues Denies suicidal/homicidal ideation: Yes. Issues/concerns per patient self-inventory:  No. Other:  New problem(s) identified: No, Describe:  stable for discharge  Reason for Continuation of Hospitalization: Other; describe no criteria for hospitalization, ready for discharge  Interventions implemented related to continuation of hospitalization:  None  Additional comments:  Not applicable  Estimated length of stay:  Discharge today  Discharge Plan:  Return to stay with family, follow up with [facility]  New goal(s):  Not applicable  Review of initial/current patient goals per problem list:   1.  Goal(s):  Stabilize mood  Met:  Yes  Target date:  By Discharge   As evidenced by:  "I'm not as anxious anymore"  2.  Goal (s):  Return psychotic symptoms to baseline  Met:  Yes  Target date:  By Discharge   As evidenced by:  "I'm not hearing voices anymore, thanks to the medication."  3.  Goal(s):  Decide on aftercare with input from patient and family  Met:  Yes  Target date:  By Discharge   As evidenced by::  All in support of  plan  Attendees: Patient:  Makayla Medina    Family:   10/08/2011  9:45 AM   Physician:  Dr. Gwenyth Bouillon Ahluwalia 10/08/2011  9:45 AM   Nursing:   Shann Medal, RN 10/08/2011  9:45 AM   Case Manager:  Ambrose Mantle, LCSW 10/08/2011  9:45 AM   Counselor:  Veto Kemps, MT-BC 10/08/2011  9:45 AM   Other:  Wilmon Arms, counseling intern 10/08/2011  9:45 AM   Other:  Lilla Shook, [facility] 10/08/2011  10:30 AM  Other:     Other:      Scribe for Treatment Team:   Sarina Ser, 10/08/2011, 9:45 AM

## 2011-10-08 NOTE — Progress Notes (Signed)
Suicide Risk Assessment  Discharge Assessment     Demographic factors:  Assessment Details Time of Assessment: Admission Information Obtained From: Patient Current Mental Status:    Risk Reduction Factors:  Risk Reduction Factors: Sense of responsibility to family;Employed;Positive social support  CLINICAL FACTORS:   Psychosis NOS  COGNITIVE FEATURES THAT CONTRIBUTE TO RISK:  Polarized thinking    SUICIDE RISK:   Minimal: No identifiable suicidal ideation.  Patients presenting with no risk factors but with morbid ruminations; may be classified as minimal risk based on the severity of the depressive symptoms  PLAN OF CARE: SEE DISCHARGE SUMMARY  Makayla Medina 10/08/2011, 9:26 AM

## 2011-10-08 NOTE — Progress Notes (Signed)
[facility] Family/Significant Other Suicide Prevention Education  Suicide Prevention Education:  Education Completed;  Makayla Medina has been identified by the patient as the family member/significant other with whom the patient will be residing, and identified as the person(s) who will aid the patient in the event of a mental health crisis (suicidal ideations/suicide attempt).  With written consent from the patient, the family member/significant other has been provided the following suicide prevention education, prior to the and/or following the discharge of the patient.  The suicide prevention education provided includes the following:  Suicide risk factors  Suicide prevention and interventions  National Suicide Hotline telephone number  [facility] assessment telephone number  [facility] Emergency Assistance 911  [facility] and/or Residential Mobile Crisis Unit telephone number  Request made of family/significant other to:  Remove weapons (e.g., guns, rifles, knives), all items previously/currently identified as safety concern.    Remove drugs/medications (over-the-counter, prescriptions, illicit drugs), all items previously/currently identified as a safety concern.  The family member/significant other verbalizes understanding of the suicide prevention education information provided.  The family member/significant other agrees to remove the items of safety concern listed above.  Makayla Medina, Makayla Medina 10/08/2011, 8:50 AM

## 2011-10-08 NOTE — Progress Notes (Signed)
Adult Services Patient-Family Contact/Session  Attendees:  Patient's mother, Al Decant):  Notify of discharge  Safety Concerns:  None  Narrative:  Mother had several questions about follow up and medications. Informed her written information would be given with information. Also notified her of the appointments already set up. She stated patient's sister would be picking her up. They thought she looked better and is doing better. Encouraged supervision when she first gets home. She will be living with mother and sister. Mother requested her FMLA papers be completed and given to patient upon discharge.  Barrier(s):  None  Interventions:  Information, safety and discharge planning  Recommendation(s):  Outpatient follow up.  Follow-up Required:  No  Explanation:    Veto Kemps 10/08/2011, 12:00 PM

## 2011-10-08 NOTE — Progress Notes (Signed)
  Makayla Medina is a 25 y.o. female [id number] 26-Sep-1986  Subjective/Objective: Patient is doing much better.  She is less disorganized, denies auditory hallucinations, denies SI/HI.  Her grandiose delusions are towards her baseline, but she still thinks about rapturing the people.  But when I asked her if she wanted to rapture people on the unit, she started laughing in a way that she understood what she was saying is a delusion.  We     History   Social History  . Marital Status: Single    Spouse Name: N/A    Number of Children: N/A  . Years of Education: N/A   Social History Main Topics  . Smoking status: Never Smoker   . Smokeless tobacco: Not on file  . Alcohol Use: 0.6 oz/week    1 Glasses of wine per week  . Drug Use: Yes    Special: Marijuana  . Sexually Active: Yes   Other Topics Concern  . Not on file   Social History Narrative  . No narrative on file     Lab Results:   BMET    Component Value Date/Time   NA 136 10/07/2011 0640   K 3.7 10/07/2011 0640   CL 102 10/07/2011 0640   CO2 25 10/07/2011 0640   GLUCOSE 90 10/07/2011 0640   BUN 11 10/07/2011 0640   CREATININE 0.72 10/07/2011 0640   CALCIUM 9.7 10/07/2011 0640   GFRNONAA >90 10/07/2011 0640   GFRAA >90 10/07/2011 0640    Medications:  Scheduled:     . risperiDONE  3 mg Oral QHS     PRN Meds acetaminophen, alum & mag hydroxide-simeth, guaiFENesin, magnesium hydroxide, risperiDONE   Plan: No side effects reported from the medications, no EPS present.  ELOS 1-2 days.     Makayla Medina S 10/08/2011

## 2011-10-11 NOTE — Progress Notes (Signed)
Patient Discharge Instructions:  Dictated admission note faxed, Date faxed:  11/19 D/C instructions faxed, Date faxed:  11/19 D/C Summary faxed, Date faxed:  11/19 Med. Rec. Form faxed, Date faxed:  11/19  Makayla Medina, 10/11/2011, 3:30 PM

## 2012-01-09 ENCOUNTER — Encounter (HOSPITAL_COMMUNITY): Payer: Self-pay

## 2012-01-09 ENCOUNTER — Emergency Department (INDEPENDENT_AMBULATORY_CARE_PROVIDER_SITE_OTHER)
Admission: EM | Admit: 2012-01-09 | Discharge: 2012-01-09 | Disposition: A | Discharge status: Home / Self Care | Payer: 59 | Source: Home / Self Care | Attending: Emergency Medicine | Admitting: Emergency Medicine

## 2012-01-09 DIAGNOSIS — L089 Local infection of the skin and subcutaneous tissue, unspecified: Secondary | ICD-10-CM

## 2012-01-09 DIAGNOSIS — B354 Tinea corporis: Secondary | ICD-10-CM

## 2012-01-09 MED ORDER — KETOCONAZOLE 2 % EX CREA: TOPICAL_CREAM | Freq: Two times a day (BID) | CUTANEOUS | Status: DC

## 2012-01-09 MED ORDER — MUPIROCIN 2 % EX OINT: TOPICAL_OINTMENT | Freq: Three times a day (TID) | CUTANEOUS | Status: AC

## 2012-01-09 NOTE — ED Notes (Signed)
1 week hx of right forehead rash.  Noted small raised bump on right temporal forehead.  Pt. states area does itch, however she has not scratched the area. Has not used any over the counter medications.

## 2012-01-09 NOTE — ED Provider Notes (Signed)
History     CSN: [id number]  Arrival date & time 01/09/12  1328   First MD Initiated Contact with Patient 01/09/12 1501      Chief Complaint  Patient presents with  . Rash    1 week hx of right forehead rash.  Noted small raised bump on right temporal forehead.  Pt. states area does itch, however she has not scratched the area.     (Consider location/radiation/quality/duration/timing/severity/associated sxs/prior treatment) HPI Comments: Presents urgent care complaining of her right sid daughter with small face rashes as well been treated for tinea corporis with pediatrician does describe that has been itching and 2 days have been applying hot water to it and this has become more swollen and tender have not used any other medicines or over-the-counter medicines or creams.  Patient is a 26 y.o. female presenting with rash. The history is provided by the patient.  Rash  This is a new problem. The current episode started more than 1 week ago. The problem has not changed since onset.There has been no fever.    Past Medical History  Diagnosis Date  . No pertinent past medical history     Past Surgical History  Procedure Date  . Dilation and curettage of uterus     History reviewed. No pertinent family history.  History  Substance Use Topics  . Smoking status: Never Smoker   . Smokeless tobacco: Not on file  . Alcohol Use: 0.6 oz/week    1 Glasses of wine per week    OB History    Grav Para Term Preterm Abortions TAB SAB Ect Mult Living   3 1 1  1 1    1       Review of Systems  Constitutional: Negative for fever and chills.  Respiratory: Negative for shortness of breath.   Skin: Positive for rash.    Allergies  Review of patient's allergies indicates no known allergies.  Home Medications   Current Outpatient Rx  Name Route Sig Dispense Refill  . KETOCONAZOLE 2 % EX CREA Topical Apply topically 2 (two) times daily. Apply bid x 3 weeks 15 g 0  . MUPIROCIN 2 % EX  OINT Topical Apply topically 3 (three) times daily. Apply to affected area for 5-7 days 22 g 0    BP 97/60  Pulse 53  Temp(Src) 97.6 F (36.4 C) (Oral)  Resp 20  SpO2 100%  LMP 12/06/2011  Physical Exam  Constitutional: She appears well-developed and well-nourished.  Eyes: Conjunctivae are normal.  Skin: Rash noted.       ED Course  Procedures (including critical care time)  Labs Reviewed - No data to display No results found.   1. Tinea corporis   2. Skin infection       MDM  R right temporal rash that seems localized tinea corporis with secondary infection.        Jimmie Molly, MD 01/09/12 [phone]

## 2012-02-29 ENCOUNTER — Encounter (HOSPITAL_COMMUNITY): Payer: Self-pay | Admitting: Emergency Medicine

## 2012-02-29 ENCOUNTER — Emergency Department (HOSPITAL_COMMUNITY)
Admission: EM | Admit: 2012-02-29 | Discharge: 2012-03-01 | Disposition: A | Discharge status: Other Acute Inpatient Hospital | Payer: 59 | Attending: Emergency Medicine | Admitting: Emergency Medicine

## 2012-02-29 DIAGNOSIS — F29 Unspecified psychosis not due to a substance or known physiological condition: Secondary | ICD-10-CM | POA: Insufficient documentation

## 2012-02-29 LAB — CBC
Hemoglobin: 12.1 g/dL (ref 12.0–15.0)
MCHC: 33.8 g/dL (ref 30.0–36.0)
WBC: 6.4 10*3/uL (ref 4.0–10.5)

## 2012-02-29 LAB — URINALYSIS, ROUTINE W REFLEX MICROSCOPIC
Glucose, UA: NEGATIVE mg/dL
Specific Gravity, Urine: 1.024 (ref 1.005–1.030)

## 2012-02-29 LAB — BASIC METABOLIC PANEL
Chloride: 102 mEq/L (ref 96–112)
GFR calc Af Amer: >90 mL/min (ref >90)
GFR calc non Af Amer: >90 mL/min (ref >90)
Glucose, Bld: 84 mg/dL (ref 70–99)
Potassium: 3.2 mEq/L — ABNORMAL LOW (ref 3.5–5.1)
Sodium: 137 mEq/L (ref 135–145)

## 2012-02-29 LAB — URINE MICROSCOPIC-ADD ON

## 2012-02-29 LAB — RAPID URINE DRUG SCREEN, HOSP PERFORMED
Barbiturates: NOT DETECTED
Benzodiazepines: NOT DETECTED
Cocaine: NOT DETECTED
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
## 2012-02-29 NOTE — ED Notes (Signed)
Pt denies si/hi

## 2012-02-29 NOTE — ED Notes (Signed)
Makayla Medina was going to take this patient back after being medically cleared, however Makayla Medina from [facility] Med[facility] called and said that since she was IVC'd by her sister and the papers were brought to [place] that we had to keep her, Charge RN explained to her that wasn't true and they could accept IVC'd patients. Makayla Medina argued with me and said that she was in our custody and that it would be a transfer in custody if they took her. Charge RN told Makayla Medina that this would be looked into because this is not the case.

## 2012-02-29 NOTE — ED Notes (Signed)
Contact information  Makayla Medina [phone]
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
## 2012-03-01 ENCOUNTER — Emergency Department (HOSPITAL_COMMUNITY): Payer: 59

## 2012-03-01 NOTE — Discharge Planning (Signed)
CSW faxed information to [facility] for possible disposition.  Manson Passey Hopelynn Gartland ANN S , MSW, LCSWA 03/01/2012 8:23 AM [phone]

## 2012-03-01 NOTE — ED Notes (Signed)
Telepsych in progress. Nursing tech in room, at bedside. Patient cooperative.

## 2012-03-01 NOTE — ED Notes (Addendum)
Pt asleep. [place] to speak with ACT counselor. Plan for telepsych. Will call report to TCU.

## 2012-03-01 NOTE — Discharge Planning (Signed)
Patient has been accepted to [facility], Dr. Wendall Stade. Nurse to call report to [phone].  Patient is IVC and will be transported by [facility] to [facility]. Patient's nurse and EDP notified.  Manson Passey Brevin Mcfadden ANN S , MSW, LCSWA 03/01/2012 12:00 PM [phone]
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
## 2012-03-01 NOTE — ED Notes (Signed)
Dr. Miller @ bedside.

## 2012-03-01 NOTE — ED Notes (Signed)
Pt returns from CT.
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
## 2012-03-01 NOTE — ED Notes (Signed)
Pt remains on unit, sleeping soundly, cont to monitor, no needs noted @ this time

## 2012-03-01 NOTE — Discharge Planning (Signed)
Telepsych requested on patient.  Manson Passey Muaz Shorey ANN S , MSW, LCSWA 03/01/2012 9:30 AM [phone]

## 2012-03-14 ENCOUNTER — Emergency Department (HOSPITAL_COMMUNITY)
Admission: EM | Admit: 2012-03-14 | Discharge: 2012-03-14 | Disposition: A | Discharge status: Home / Self Care | Payer: 59

## 2012-03-15 ENCOUNTER — Inpatient Hospital Stay (EMERGENCY_DEPARTMENT_HOSPITAL)
Admission: AD | Admit: 2012-03-15 | Discharge: 2012-03-15 | Payer: 59 | Source: Ambulatory Visit | Attending: Obstetrics & Gynecology | Admitting: Obstetrics & Gynecology

## 2012-03-15 ENCOUNTER — Emergency Department (HOSPITAL_COMMUNITY)
Admission: EM | Admit: 2012-03-15 | Discharge: 2012-03-16 | Disposition: A | Discharge status: Other Acute Inpatient Hospital | Payer: 59 | Attending: Emergency Medicine | Admitting: Emergency Medicine

## 2012-03-15 ENCOUNTER — Encounter (HOSPITAL_COMMUNITY): Payer: Self-pay | Admitting: *Deleted

## 2012-03-15 ENCOUNTER — Encounter (HOSPITAL_COMMUNITY): Payer: Self-pay

## 2012-03-15 DIAGNOSIS — F29 Unspecified psychosis not due to a substance or known physiological condition: Secondary | ICD-10-CM

## 2012-03-15 DIAGNOSIS — R109 Unspecified abdominal pain: Secondary | ICD-10-CM | POA: Insufficient documentation

## 2012-03-15 DIAGNOSIS — F209 Schizophrenia, unspecified: Secondary | ICD-10-CM | POA: Insufficient documentation

## 2012-03-15 DIAGNOSIS — F319 Bipolar disorder, unspecified: Secondary | ICD-10-CM | POA: Insufficient documentation

## 2012-03-15 DIAGNOSIS — IMO0002 Reserved for concepts with insufficient information to code with codable children: Secondary | ICD-10-CM | POA: Insufficient documentation

## 2012-03-15 DIAGNOSIS — F911 Conduct disorder, childhood-onset type: Secondary | ICD-10-CM | POA: Insufficient documentation

## 2012-03-15 DIAGNOSIS — F411 Generalized anxiety disorder: Secondary | ICD-10-CM | POA: Insufficient documentation

## 2012-03-15 HISTORY — DX: Bipolar disorder, unspecified: F31.9

## 2012-03-15 HISTORY — DX: Anxiety disorder, unspecified: F41.9

## 2012-03-15 LAB — COMPREHENSIVE METABOLIC PANEL
ALT: 13 U/L (ref 0–35)
CO2: 25 mEq/L (ref 19–32)
Calcium: 9.5 mg/dL (ref 8.4–10.5)
GFR calc Af Amer: >90 mL/min (ref >90)
GFR calc non Af Amer: >90 mL/min (ref >90)
Glucose, Bld: 109 mg/dL — ABNORMAL HIGH (ref 70–99)
Sodium: 136 mEq/L (ref 135–145)

## 2012-03-15 LAB — DIFFERENTIAL
Eosinophils Relative: 2 % (ref 0–5)
Lymphocytes Relative: 36 % (ref 12–46)
Lymphs Abs: 1.9 10*3/uL (ref 0.7–4.0)
Monocytes Absolute: 0.6 10*3/uL (ref 0.1–1.0)

## 2012-03-15 LAB — CBC
HCT: 37.9 % (ref 36.0–46.0)
Hemoglobin: 12.9 g/dL (ref 12.0–15.0)
MCV: 83.3 fL (ref 78.0–100.0)
Platelets: 158 10*3/uL (ref 150–400)
RBC: 4.55 MIL/uL (ref 3.87–5.11)
WBC: 5.3 10*3/uL (ref 4.0–10.5)

## 2012-03-15 LAB — ETHANOL: Alcohol, Ethyl (B): <11 mg/dL (ref 0–11)

## 2012-03-15 MED ORDER — IBUPROFEN 600 MG PO TABS: 600.0000 mg | ORAL_TABLET | Freq: Three times a day (TID) | ORAL | Status: DC | PRN

## 2012-03-15 MED ORDER — LORAZEPAM 1 MG PO TABS
1.0000 mg | ORAL_TABLET | Freq: Three times a day (TID) | ORAL | Status: DC | PRN
  Administered 2012-03-16: 1 mg via ORAL
  Filled 2012-03-15: qty 1

## 2012-03-15 MED ORDER — RISPERIDONE MICROSPHERES 25 MG IM SUSR
25.0000 mg | INTRAMUSCULAR | Status: DC
  Filled 2012-03-15: qty 2

## 2012-03-15 MED ORDER — NICOTINE 21 MG/24HR TD PT24
21.0000 mg | MEDICATED_PATCH | Freq: Every day | TRANSDERMAL | Status: DC
  Filled 2012-03-15: qty 1

## 2012-03-15 MED ORDER — RISPERIDONE 1 MG PO TABS
1.0000 mg | ORAL_TABLET | Freq: Two times a day (BID) | ORAL | Status: DC
  Administered 2012-03-16 (×2): 1 mg via ORAL
  Filled 2012-03-15 (×2): qty 1

## 2012-03-15 MED ORDER — LORAZEPAM 2 MG/ML IJ SOLN
2.0000 mg | Freq: Once | INTRAMUSCULAR | Status: AC
  Administered 2012-03-15: 2 mg via INTRAMUSCULAR

## 2012-03-15 MED ORDER — ALUM & MAG HYDROXIDE-SIMETH 200-200-20 MG/5ML PO SUSP: 30.0000 mL | ORAL | Status: DC | PRN

## 2012-03-15 MED ORDER — ONDANSETRON HCL 4 MG PO TABS: 4.0000 mg | ORAL_TABLET | Freq: Three times a day (TID) | ORAL | Status: DC | PRN

## 2012-03-15 MED ORDER — BENZTROPINE MESYLATE 1 MG PO TABS
0.5000 mg | ORAL_TABLET | Freq: Two times a day (BID) | ORAL | Status: DC
  Administered 2012-03-16 (×2): 0.5 mg via ORAL
  Filled 2012-03-15 (×2): qty 1

## 2012-03-15 MED ORDER — LORAZEPAM 2 MG/ML IJ SOLN
INTRAMUSCULAR | Status: AC
  Filled 2012-03-15: qty 1

## 2012-03-15 NOTE — MAU Note (Signed)
Plans to maintain continuous pulse oximetry. Pt covered self will not respond to questions, [employer] at bedside.

## 2012-03-15 NOTE — Progress Notes (Signed)
Pt states is 6 months pregnant and having pain. Falling asleep while talking. Denies alcohol or drug use. States she cannot urinate and wants food.

## 2012-03-15 NOTE — ED Notes (Signed)
Pt states that she refuses her Risperdal Consta Injection at this time, stating she will take it after she eats. Supervisor is Financial controller for a tray.
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
## 2012-03-15 NOTE — Progress Notes (Signed)
[facility]  Invited pt to group in Psych ED at approximately 2:30pm, pt declined, stated she wanted a [place]. Writer and chaplain Lenell Antu collaborated with pt to find her a Q'uran, Forensic scientist agreed to speak with pt.  Allani Reber B MS, LPCA, NCC
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
## 2012-03-15 NOTE — MAU Note (Signed)
Patient complains of lower abdominal pain and states she just wants to be checked out. Just left the club and was drinking.

## 2012-03-15 NOTE — Progress Notes (Signed)
Patient taken to [facility] ED by [facility]. IVC papers picked up and verified by the police sergeant. Patient still at this time refuses treatment or to be seen and evaluated. [facility] ED notified that the patient was being sent over escorted by the police.
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
## 2012-03-15 NOTE — ED Notes (Signed)
Pt has been undressed, placed in blue scrubs and wanded by security

## 2012-03-15 NOTE — ED Notes (Signed)
Ativan given- when approached with other staff RN, patient stated- "She is not coming into my room-she's a bitch!" both nurses entered room without incident.  Has made a collage out of magazine strips, is very protective of it.

## 2012-03-15 NOTE — Progress Notes (Signed)
Initial visit with pt on referral from nursing and chaplain.   Pt requested Koran.  Nursing reports pt has been tearing pages out of bibles and books, Chaplain visited with pt to assess need for Koran and provide support.   Pt was lying on bed, lights off.  Speech was quiet and tangential.  Pt was lethargic and with flat affect.    Pt was welcoming to chaplain and requested Koran.  When Chaplain mentioned pt tearing books and inquired, pt reported that she had been "doing work" and asked chaplain if he would like to see.  Pt produced folder with several pictures torn from magazines.  Pt spoke about a presentation and how she needed to "get her things," as she has to present to the staff of the [employer] on reorganizing their decor.    Pt spoke with chaplain about houses in an advertisement she had and expressed hope that she would be able to provide for her family.  Chaplain inquired about family and pointed out that it seemed pt wanted to care for them.  Pt spoke with chaplain about family and reported that she had "family coming."  Spoke about two fathers, one of which is [place] and a sister.  Spoke about being from [place].   Pt inquired about getting her bags and concern for safety of her "work" during conversation and chaplain was able to redirect and assure pt that her possessions were safe.  Pt showed little understanding or insight into what brought her to ED and process / boundaries for being in ED.  Pt expressed hope that she could go outside and smoke cigarette and left room to express this desire to the nursing staff.    Pt did not continue to express need for Koran after initial inquiry, so chaplain did not deliver Koran to pt.  Koran is at nursing station should pt continue request.    Belva Crome  MDiv, Chaplain    03/15/12 1600  Clinical Encounter Type  Visited With Patient;Other (Comment) (Doctoral counseling student Juanetta Beets was in  room )  Visit Type Initial;Psychological support;Spiritual support;Social support;ED;Behavioral Health  Referral From Nurse;Chaplain  Consult/Referral To Nurse  Recommendations Follow up for continued care   Spiritual Encounters  Spiritual Needs Emotional  Stress Factors  Patient Stress Factors Health changes

## 2012-03-15 NOTE — ED Notes (Signed)
Pt to Psych ED from triage in blue scrubs. Speaks in very quiet voice with accent- requests to see chaplain to get a Koran. Chaplain notified.  Pt was tearing pages out of magazines, requesting a pencil. Pencil given. Instructed NOT to tear any more magazines. No objections voiced from patient.

## 2012-03-15 NOTE — ED Notes (Signed)
Pt arrives by [facility] from [facility] under IVC-pt declines to answer questions-states she was at [facility] because she thought she was pregnant-pt will not make eye contact-IVC papers state that pt thinks she works for the [employer] and she is searching for bombs.  Pt found by [employer] walking [place] [address]

## 2012-03-15 NOTE — Progress Notes (Signed)
ACT Team paged to evaluate patient

## 2012-03-15 NOTE — ED Notes (Signed)
MD at bedside. 

## 2012-03-15 NOTE — ED Notes (Signed)
In hallway- demanding tape for pictures, told no- called staff "Fucking Bitch!" yelling expletives at staff. ED Dr. Truitt Merle. Orders received.

## 2012-03-15 NOTE — ED Notes (Signed)
Clothes and pair of yellow toned earrings placed in 1 belonging bag, 1 black pocket book placed in Triage cabinet #1
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
## 2012-03-15 NOTE — ED Provider Notes (Addendum)
History     CSN: [id number]  Arrival date & time 03/15/12  0530   First MD Initiated Contact with Patient 03/15/12 0701      Chief Complaint  Patient presents with  . Medical Clearance    (Consider location/radiation/quality/duration/timing/severity/associated sxs/prior treatment) The history is provided by the patient. The history is limited by the condition of the patient.   Patient here under IVC 22 making threats to make a bomb. History of schizophrenia and has been hospitalized for the past. Patient went to [facility] hospital prior to arrival because she thought she was pregnant and had bizarre behavior, [employer] was called and patient brought here. Patient's sister filled out the IVC paperwork. She currently denies suicidal or homicidal ideations. She denies any auditory or visual hallucinations. She would not give a complete history Past Medical History  Diagnosis Date  . Anxiety   . Bipolar disorder     Past Surgical History  Procedure Date  . Dilation and curettage of uterus     History reviewed. No pertinent family history.  History  Substance Use Topics  . Smoking status: Never Smoker   . Smokeless tobacco: Not on file  . Alcohol Use: 0.6 oz/week    1 Glasses of wine per week    OB History    Grav Para Term Preterm Abortions TAB SAB Ect Mult Living   3 1 1  1 1    1       Review of Systems  Unable to perform ROS   Allergies  Review of patient's allergies indicates no known allergies.  Home Medications  No current outpatient prescriptions on file.  BP 90/50  Pulse 63  Temp(Src) 97.9 F (36.6 C) (Oral)  Resp 16  SpO2 100%  LMP 05/26/2011  Physical Exam  Nursing note and vitals reviewed. Constitutional: She is oriented to person, place, and time. She appears well-developed and well-nourished.  Non-toxic appearance. No distress.  HENT:  Head: Normocephalic and atraumatic.  Eyes: Conjunctivae, EOM and lids are normal. Pupils are equal, round, and  reactive to light.  Neck: Normal range of motion. Neck supple. No tracheal deviation present. No mass present.  Cardiovascular: Normal rate, regular rhythm and normal heart sounds.  Exam reveals no gallop.   No murmur heard. Pulmonary/Chest: Effort normal and breath sounds normal. No stridor. No respiratory distress. She has no decreased breath sounds. She has no wheezes. She has no rhonchi. She has no rales.  Abdominal: Soft. Normal appearance and bowel sounds are normal. She exhibits no distension. There is no tenderness. There is no rebound and no CVA tenderness.  Musculoskeletal: Normal range of motion. She exhibits no edema and no tenderness.  Neurological: She is alert and oriented to person, place, and time. She has normal strength. No cranial nerve deficit or sensory deficit. GCS eye subscore is 4. GCS verbal subscore is 5. GCS motor subscore is 6.  Skin: Skin is warm and dry. No abrasion and no rash noted.  Psychiatric: Her affect is blunt. Her speech is delayed. She is withdrawn. She is not slowed. Thought content is paranoid. She expresses no homicidal and no suicidal ideation.    ED Course  Procedures (including critical care time)  Labs Reviewed  COMPREHENSIVE METABOLIC PANEL - Abnormal; Notable for the following:    Potassium 3.4 (*)    Glucose, Bld 109 (*)    All other components within normal limits  ETHANOL  CBC  DIFFERENTIAL  URINE RAPID DRUG SCREEN (HOSP PERFORMED)  No results found.   No diagnosis found.    MDM        Patient seen in followup after initial note by Dr. Freida Busman. Patient is pending placement. She's been recommended for inpatient treatment. She is sleeping comfortably on my evaluation this morning        Juliet Rude. Rubin Payor, MD 03/16/12 [phone]  Patient has been accepted at [facility] R. Rubin Payor, MD 03/16/12 [phone]

## 2012-03-15 NOTE — Progress Notes (Signed)
Security and [employer] at the bedside

## 2012-03-15 NOTE — Progress Notes (Signed)
Jerrye Bushy RN House coverage and security at bedside to talk to patient.

## 2012-03-15 NOTE — Progress Notes (Signed)
Patient states she can not urinate and wants some food. When discussing plan of care patient became tearful saying she wouldn't answer questions or do anything until she ate then proceeded to state she wanted to be left alone and for [place] to leave. The patient then to sat straight up in bed yelling and lunged at the provider and myself.

## 2012-03-16 ENCOUNTER — Telehealth (HOSPITAL_COMMUNITY): Payer: Self-pay | Admitting: Licensed Clinical Social Worker

## 2012-03-16 NOTE — ED Notes (Signed)
Report called to [place] Lang[place] RN.  [employer] contacted, and stated they will be here within 30 min.

## 2012-03-16 NOTE — BH Assessment (Signed)
Accepted to [facility] by Dr. Betti Cruz. Nurse Report [id number]. Transport via [employer].

## 2013-12-14 ENCOUNTER — Inpatient Hospital Stay (HOSPITAL_COMMUNITY)
Admission: AD | Admit: 2013-12-14 | Discharge: 2013-12-14 | Disposition: A | Discharge status: Home / Self Care | Payer: Medicaid Other | Source: Ambulatory Visit | Attending: Obstetrics & Gynecology | Admitting: Obstetrics & Gynecology

## 2013-12-14 ENCOUNTER — Encounter (HOSPITAL_COMMUNITY): Payer: Self-pay | Admitting: *Deleted

## 2013-12-14 ENCOUNTER — Inpatient Hospital Stay (HOSPITAL_COMMUNITY): Payer: Medicaid Other

## 2013-12-14 ENCOUNTER — Other Ambulatory Visit: Payer: Self-pay | Admitting: Advanced Practice Midwife

## 2013-12-14 DIAGNOSIS — O9989 Other specified diseases and conditions complicating pregnancy, childbirth and the puerperium: Secondary | ICD-10-CM

## 2013-12-14 DIAGNOSIS — B9689 Other specified bacterial agents as the cause of diseases classified elsewhere: Secondary | ICD-10-CM | POA: Insufficient documentation

## 2013-12-14 DIAGNOSIS — O2390 Unspecified genitourinary tract infection in pregnancy, unspecified trimester: Secondary | ICD-10-CM | POA: Insufficient documentation

## 2013-12-14 DIAGNOSIS — R1032 Left lower quadrant pain: Secondary | ICD-10-CM | POA: Insufficient documentation

## 2013-12-14 DIAGNOSIS — O99891 Other specified diseases and conditions complicating pregnancy: Secondary | ICD-10-CM

## 2013-12-14 DIAGNOSIS — A499 Bacterial infection, unspecified: Secondary | ICD-10-CM | POA: Insufficient documentation

## 2013-12-14 DIAGNOSIS — N760 Acute vaginitis: Secondary | ICD-10-CM | POA: Insufficient documentation

## 2013-12-14 DIAGNOSIS — Z3490 Encounter for supervision of normal pregnancy, unspecified, unspecified trimester: Secondary | ICD-10-CM

## 2013-12-14 HISTORY — DX: Gonococcal infection, unspecified: A54.9

## 2013-12-14 HISTORY — DX: Unspecified abnormal cytological findings in specimens from vagina: R87.629

## 2013-12-14 HISTORY — DX: Chlamydial infection, unspecified: A74.9

## 2013-12-14 LAB — WET PREP, GENITAL
TRICH WET PREP: NONE SEEN
Yeast Wet Prep HPF POC: NONE SEEN

## 2013-12-14 LAB — CBC
HEMATOCRIT: 41.2 % (ref 36.0–46.0)
Hemoglobin: 14.1 g/dL (ref 12.0–15.0)
MCH: 29.8 pg (ref 26.0–34.0)
MCHC: 34.2 g/dL (ref 30.0–36.0)
MCV: 87.1 fL (ref 78.0–100.0)
Platelets: 174 10*3/uL (ref 150–400)
RBC: 4.73 MIL/uL (ref 3.87–5.11)
RDW: 13 % (ref 11.5–15.5)
WBC: 6.4 10*3/uL (ref 4.0–10.5)

## 2013-12-14 LAB — URINALYSIS, ROUTINE W REFLEX MICROSCOPIC
BILIRUBIN URINE: NEGATIVE
GLUCOSE, UA: NEGATIVE mg/dL
HGB URINE DIPSTICK: NEGATIVE
Ketones, ur: NEGATIVE mg/dL
Leukocytes, UA: NEGATIVE
Nitrite: NEGATIVE
PH: 7 (ref 5.0–8.0)
Protein, ur: NEGATIVE mg/dL
SPECIFIC GRAVITY, URINE: 1.015 (ref 1.005–1.030)
Urobilinogen, UA: 0.2 mg/dL (ref 0.0–1.0)

## 2013-12-14 LAB — HCG, QUANTITATIVE, PREGNANCY: HCG, BETA CHAIN, QUANT, S: 12495 m[IU]/mL — AB (ref <5)

## 2013-12-14 LAB — POCT PREGNANCY, URINE: Preg Test, Ur: POSITIVE — AB

## 2013-12-14 MED ORDER — METRONIDAZOLE 500 MG PO TABS: 500.0000 mg | ORAL_TABLET | Freq: Two times a day (BID) | ORAL | Status: AC

## 2013-12-14 NOTE — MAU Provider Note (Signed)
Chief Complaint: Abdominal Pain   First Provider Initiated Contact with Patient 12/14/13 1217     SUBJECTIVE HPI: Makayla Medina is a 28 y.o. G4P1021 at Unknown by LMP who presents to maternity admissions reporting LLQ sharp severe intermittent pain x3 days.  She had a positive pregnancy test at [facility] today.  She also has daily nausea but without vomiting.  She denies vaginal bleeding, vaginal itching/burning, urinary symptoms, h/a, dizziness, vomiting, or fever/chills.  .   Past Medical History  Diagnosis Date  . Anxiety   . Bipolar disorder   . Vaginal Pap smear, abnormal   . Gonorrhea   . Chlamydia    Past Surgical History  Procedure Laterality Date  . Dilation and curettage of uterus    . Colposcopy     History   Social History  . Marital Status: Single    Spouse Name: N/A    Number of Children: N/A  . Years of Education: N/A   Occupational History  . Not on file.   Social History Main Topics  . Smoking status: Never Smoker   . Smokeless tobacco: Not on file  . Alcohol Use: 0.6 oz/week    1 Glasses of wine per week     Comment: socially  . Drug Use: Yes    Special: Marijuana     Comment: 3 days ago. Uses once a week.  Marland Kitchen Sexual Activity: Yes   Other Topics Concern  . Not on file   Social History Narrative  . No narrative on file   No current facility-administered medications on file prior to encounter.   No current outpatient prescriptions on file prior to encounter.   No Known Allergies  ROS: Pertinent items in HPI  OBJECTIVE Blood pressure 105/73, pulse 82, temperature 98.2 F (36.8 C), temperature source Oral, resp. rate 18, height 5\' 6"  (1.676 m), weight 87.544 kg (193 lb), last menstrual period 11/11/2013. GENERAL: Well-developed, well-nourished female in no acute distress.  HEENT: Normocephalic HEART: normal rate RESP: normal effort ABDOMEN: Soft, non-tender EXTREMITIES: Nontender, no edema NEURO: Alert and oriented Pelvic  exam: Cervix pink, visually closed, without lesion, moderate thin white discharge, vaginal walls and external genitalia normal Bimanual exam: Cervix 0/long/high, firm, anterior, neg CMT, uterus nontender, nonenlarged, adnexa with mild tenderness on left, none on right, no enlargement or mass bilaterally  LAB RESULTS Results for orders placed during the hospital encounter of 12/14/13 (from the past 24 hour(s))  POCT PREGNANCY, URINE     Status: Abnormal   Collection Time    12/14/13 11:46 AM      Result Value Range   Preg Test, Ur POSITIVE (*) NEGATIVE  URINALYSIS, ROUTINE W REFLEX MICROSCOPIC     Status: None   Collection Time    12/14/13 11:48 AM      Result Value Range   Color, Urine YELLOW  YELLOW   APPearance CLEAR  CLEAR   Specific Gravity, Urine 1.015  1.005 - 1.030   pH 7.0  5.0 - 8.0   Glucose, UA NEGATIVE  NEGATIVE mg/dL   Hgb urine dipstick NEGATIVE  NEGATIVE   Bilirubin Urine NEGATIVE  NEGATIVE   Ketones, ur NEGATIVE  NEGATIVE mg/dL   Protein, ur NEGATIVE  NEGATIVE mg/dL   Urobilinogen, UA 0.2  0.0 - 1.0 mg/dL   Nitrite NEGATIVE  NEGATIVE   Leukocytes, UA NEGATIVE  NEGATIVE  HCG, QUANTITATIVE, PREGNANCY     Status: Abnormal   Collection Time    12/14/13 12:00 PM  Result Value Range   hCG, Beta Chain, Quant, S 12495 (*) <5 mIU/mL  CBC     Status: None   Collection Time    12/14/13 12:00 PM      Result Value Range   WBC 6.4  4.0 - 10.5 K/uL   RBC 4.73  3.87 - 5.11 MIL/uL   Hemoglobin 14.1  12.0 - 15.0 g/dL   HCT 29.5  18.8 - 41.6 %   MCV 87.1  78.0 - 100.0 fL   MCH 29.8  26.0 - 34.0 pg   MCHC 34.2  30.0 - 36.0 g/dL   RDW 60.6  30.1 - 60.1 %   Platelets 174  150 - 400 K/uL  WET PREP, GENITAL     Status: Abnormal   Collection Time    12/14/13 12:19 PM      Result Value Range   Yeast Wet Prep HPF POC NONE SEEN  NONE SEEN   Trich, Wet Prep NONE SEEN  NONE SEEN   Clue Cells Wet Prep HPF POC MODERATE (*) NONE SEEN   WBC, Wet Prep HPF POC MANY (*) NONE SEEN     IMAGING US Ob Comp Less 14 Wks  12/14/2013   CLINICAL DATA:  Pregnant, left lower quadrant pain, beta HCG 12,495  EXAM: OBSTETRIC <14 WK [place] AND TRANSVAGINAL OB US  TECHNIQUE: Both transabdominal and transvaginal ultrasound examinations were performed for complete evaluation of the gestation as well as the maternal uterus, adnexal regions, and pelvic cul-de-sac. Transvaginal technique was performed to assess early pregnancy.  COMPARISON:  None.  FINDINGS: Intrauterine gestational sac: Visualized/normal in shape.  Yolk sac:  Present  Embryo:  Not visualized  MSD:  12.4  mm   6 w   0  d  [place] EDC: 08/09/2014  Maternal uterus/adnexae: No subchorionic hemorrhage.  Right ovary is within normal limits.  Left ovary is within normal limits, noting a corpus luteal cyst.  Trace pelvic fluid.  IMPRESSION: Single intrauterine gestational sac with yolk sac. No fetal pole is visualized.  Estimated gestational age [redacted] weeks 0 days by mean sac diameter.  Follow-up pelvic ultrasound is suggested in 10-14 days to document viability.   Electronically Signed   By: Charline Bills M.D.   On: 12/14/2013 13:59   US Ob Transvaginal  12/14/2013   CLINICAL DATA:  Pregnant, left lower quadrant pain, beta HCG 12,495  EXAM: OBSTETRIC <14 WK [place] AND TRANSVAGINAL OB US  TECHNIQUE: Both transabdominal and transvaginal ultrasound examinations were performed for complete evaluation of the gestation as well as the maternal uterus, adnexal regions, and pelvic cul-de-sac. Transvaginal technique was performed to assess early pregnancy.  COMPARISON:  None.  FINDINGS: Intrauterine gestational sac: Visualized/normal in shape.  Yolk sac:  Present  Embryo:  Not visualized  MSD:  12.4  mm   6 w   0  d  [place] EDC: 08/09/2014  Maternal uterus/adnexae: No subchorionic hemorrhage.  Right ovary is within normal limits.  Left ovary is within normal limits, noting a corpus luteal cyst.  Trace pelvic fluid.  IMPRESSION: Single intrauterine gestational sac with  yolk sac. No fetal pole is visualized.  Estimated gestational age [redacted] weeks 0 days by mean sac diameter.  Follow-up pelvic ultrasound is suggested in 10-14 days to document viability.   Electronically Signed   By: Charline Bills M.D.   On: 12/14/2013 13:59    ASSESSMENT 1. Normal IUP (intrauterine pregnancy) on prenatal ultrasound   2.  Bacterial Vaginosis  PLAN Discharge  home Flagyl 500 mg BID x7 days F/U with early prenatal care, pt given list of providers Return to MAU as needed   Follow-up Information   [facility]. (Or prenatal provider of your choice. Return to MAU as needed.)    Specialty:  Obstetrics and Gynecology   Contact information:   [address] [place] [id number] [phone]      Sharen CounterLisa Leftwich-Kirby Certified Nurse-Midwife 12/14/2013  8:23 PM

## 2013-12-14 NOTE — MAU Note (Signed)
Sharp pains in LLQ started a few days ago, getting worse.  [facility]

## 2013-12-14 NOTE — Progress Notes (Signed)
Pt wet prep positive for bacterial vaginosis in MAU on 12/14/13.  This may be contributing to her pain so treatment recommended.  Flagyl 500 mg BID x 7 days sent to pt pharmacy.

## 2013-12-14 NOTE — Discharge Instructions (Signed)
For nausea, take Vitamin B6 tablets 25 mg every 6 hours (or 50 mg twice per day).  If needed, take Unisom 1/2 tablet (doxylamine 12.5 mg) at night.  If nausea persists, take another 1/2 Unisom tablet in the morning.   Nausea/Vomiting of Pregnancy Morning sickness is when you feel sick to your stomach (nauseous) during pregnancy. This nauseous feeling may or may not come with vomiting. It often occurs in the morning but can be a problem any time of day. Morning sickness is most common during the first trimester, but it may continue throughout pregnancy. While morning sickness is unpleasant, it is usually harmless unless you develop severe and continual vomiting (hyperemesis gravidarum). This condition requires more intense treatment.  CAUSES  The cause of morning sickness is not completely known but seems to be related to normal hormonal changes that occur in pregnancy. RISK FACTORS You are at greater risk if you:  Experienced nausea or vomiting before your pregnancy.  Had morning sickness during a previous pregnancy.  Are pregnant with more than one baby, such as twins. TREATMENT  Do not use any medicines (prescription, over-the-counter, or herbal) for morning sickness without first talking to your health care provider. Your health care provider may prescribe or recommend:  Vitamin B6 supplements.  Anti-nausea medicines.  The herbal medicine ginger. HOME CARE INSTRUCTIONS   Only take over-the-counter or prescription medicines as directed by your health care provider.  Taking multivitamins before getting pregnant can prevent or decrease the severity of morning sickness in most women.   Eat a piece of dry toast or unsalted crackers before getting out of bed in the morning.   Eat five or six small meals a day.   Eat dry and bland foods (rice, baked potato). Foods high in carbohydrates are often helpful.  Do not drink liquids with your meals. Drink liquids between meals.    Avoid greasy, fatty, and spicy foods.   Get someone to cook for you if the smell of any food causes nausea and vomiting.   If you feel nauseous after taking prenatal vitamins, take the vitamins at night or with a snack.  Snack on protein foods (nuts, yogurt, cheese) between meals if you are hungry.   Eat unsweetened gelatins for desserts.   Wearing an acupressure wristband (worn for sea sickness) may be helpful.   Acupuncture may be helpful.   Do not smoke.   Get a humidifier to keep the air in your house free of odors.   Get plenty of fresh air. SEEK MEDICAL CARE IF:   Your home remedies are not working, and you need medicine.  You feel dizzy or lightheaded.  You are losing weight. SEEK IMMEDIATE MEDICAL CARE IF:   You have persistent and uncontrolled nausea and vomiting.  You pass out (faint). Document Released: 12/30/2006 Document Revised: 07/11/2013 Document Reviewed: 04/25/2013 [facility] Patient Information 2014 [employer] [place].

## 2013-12-15 LAB — GC/CHLAMYDIA PROBE AMP
CT Probe RNA: NEGATIVE
GC PROBE AMP APTIMA: NEGATIVE

## 2013-12-17 NOTE — Progress Notes (Signed)
Patient called and left message that she is returning our call. She can be reached at [phone].

## 2013-12-17 NOTE — Progress Notes (Signed)
Called patient, no answer- left message that we are calling with some results, nothing urgent but if you could please give us a call back at the clinics

## 2013-12-17 NOTE — Progress Notes (Signed)
Called pt. At number [phone] and left message stating we are returning her call, please call clinic and leave message stating whether or not is OK for us to leave detailed information on voicemail and which number to leave that information.

## 2013-12-18 NOTE — Progress Notes (Signed)
Pt. Called and left message stating it is OK to leave detailed message on voicemail. Called pt. And left message stating Flagyl has been prescribed for Bacterial Vaginosis, caused by an over growth of normal bacteria, pick the prescription up at your pharmacy and take it in its entirety. If symptoms persist call clinic.

## 2014-02-18 ENCOUNTER — Emergency Department (EMERGENCY_DEPARTMENT_HOSPITAL)
Admission: EM | Admit: 2014-02-18 | Discharge: 2014-02-19 | Disposition: A | Discharge status: Other Acute Inpatient Hospital | Payer: Medicaid Other | Source: Home / Self Care | Attending: Emergency Medicine | Admitting: Emergency Medicine

## 2014-02-18 ENCOUNTER — Encounter (HOSPITAL_COMMUNITY): Payer: Self-pay | Admitting: Emergency Medicine

## 2014-02-18 DIAGNOSIS — Z8619 Personal history of other infectious and parasitic diseases: Secondary | ICD-10-CM | POA: Insufficient documentation

## 2014-02-18 DIAGNOSIS — R443 Hallucinations, unspecified: Secondary | ICD-10-CM | POA: Insufficient documentation

## 2014-02-18 DIAGNOSIS — N390 Urinary tract infection, site not specified: Secondary | ICD-10-CM | POA: Insufficient documentation

## 2014-02-18 DIAGNOSIS — Z3202 Encounter for pregnancy test, result negative: Secondary | ICD-10-CM

## 2014-02-18 DIAGNOSIS — IMO0002 Reserved for concepts with insufficient information to code with codable children: Secondary | ICD-10-CM | POA: Insufficient documentation

## 2014-02-18 DIAGNOSIS — R45850 Homicidal ideations: Secondary | ICD-10-CM

## 2014-02-18 DIAGNOSIS — F29 Unspecified psychosis not due to a substance or known physiological condition: Secondary | ICD-10-CM | POA: Insufficient documentation

## 2014-02-18 DIAGNOSIS — Z8659 Personal history of other mental and behavioral disorders: Secondary | ICD-10-CM | POA: Insufficient documentation

## 2014-02-18 DIAGNOSIS — F411 Generalized anxiety disorder: Secondary | ICD-10-CM | POA: Diagnosis present

## 2014-02-18 DIAGNOSIS — F1210 Cannabis abuse, uncomplicated: Secondary | ICD-10-CM

## 2014-02-18 DIAGNOSIS — F259 Schizoaffective disorder, unspecified: Principal | ICD-10-CM | POA: Diagnosis present

## 2014-02-18 DIAGNOSIS — Z598 Other problems related to housing and economic circumstances: Secondary | ICD-10-CM

## 2014-02-18 DIAGNOSIS — Z5987 Material hardship due to limited financial resources, not elsewhere classified: Secondary | ICD-10-CM

## 2014-02-18 DIAGNOSIS — G4700 Insomnia, unspecified: Secondary | ICD-10-CM | POA: Diagnosis present

## 2014-02-18 DIAGNOSIS — R45851 Suicidal ideations: Secondary | ICD-10-CM

## 2014-02-18 LAB — COMPREHENSIVE METABOLIC PANEL
ALT: 19 U/L (ref 0–35)
AST: 43 U/L — ABNORMAL HIGH (ref 0–37)
Albumin: 5.1 g/dL (ref 3.5–5.2)
Alkaline Phosphatase: 77 U/L (ref 39–117)
BUN: 12 mg/dL (ref 6–23)
CALCIUM: 10.2 mg/dL (ref 8.4–10.5)
CO2: 21 mEq/L (ref 19–32)
CREATININE: 0.9 mg/dL (ref 0.50–1.10)
Chloride: 100 mEq/L (ref 96–112)
GFR calc Af Amer: >90 mL/min (ref >90)
GFR calc non Af Amer: 87 mL/min — ABNORMAL LOW (ref >90)
Glucose, Bld: 100 mg/dL — ABNORMAL HIGH (ref 70–99)
Potassium: 3.6 mEq/L — ABNORMAL LOW (ref 3.7–5.3)
Sodium: 138 mEq/L (ref 137–147)
Total Bilirubin: 0.7 mg/dL (ref 0.3–1.2)
Total Protein: 8.8 g/dL — ABNORMAL HIGH (ref 6.0–8.3)

## 2014-02-18 LAB — URINALYSIS, ROUTINE W REFLEX MICROSCOPIC
Bilirubin Urine: NEGATIVE
GLUCOSE, UA: NEGATIVE mg/dL
Ketones, ur: 15 mg/dL — AB
Nitrite: NEGATIVE
PH: 6.5 (ref 5.0–8.0)
Protein, ur: NEGATIVE mg/dL
Specific Gravity, Urine: 1.016 (ref 1.005–1.030)
Urobilinogen, UA: 0.2 mg/dL (ref 0.0–1.0)

## 2014-02-18 LAB — CBC
HCT: 44.2 % (ref 36.0–46.0)
Hemoglobin: 15.2 g/dL — ABNORMAL HIGH (ref 12.0–15.0)
MCH: 30.2 pg (ref 26.0–34.0)
MCHC: 34.4 g/dL (ref 30.0–36.0)
MCV: 87.9 fL (ref 78.0–100.0)
Platelets: 220 10*3/uL (ref 150–400)
RBC: 5.03 MIL/uL (ref 3.87–5.11)
RDW: 13.1 % (ref 11.5–15.5)
WBC: 10 10*3/uL (ref 4.0–10.5)

## 2014-02-18 LAB — PREGNANCY, URINE: Preg Test, Ur: NEGATIVE

## 2014-02-18 LAB — ACETAMINOPHEN LEVEL

## 2014-02-18 LAB — URINE MICROSCOPIC-ADD ON

## 2014-02-18 LAB — RAPID URINE DRUG SCREEN, HOSP PERFORMED
Amphetamines: NOT DETECTED
BENZODIAZEPINES: NOT DETECTED
Barbiturates: NOT DETECTED
Cocaine: NOT DETECTED
OPIATES: NOT DETECTED
Tetrahydrocannabinol: POSITIVE — AB

## 2014-02-18 LAB — SALICYLATE LEVEL: Salicylate Lvl: <2 mg/dL — ABNORMAL LOW (ref 2.8–20.0)

## 2014-02-18 MED ORDER — ONDANSETRON HCL 4 MG PO TABS: 4.0000 mg | ORAL_TABLET | Freq: Three times a day (TID) | ORAL | Status: DC | PRN

## 2014-02-18 MED ORDER — ACETAMINOPHEN 325 MG PO TABS: 650.0000 mg | ORAL_TABLET | ORAL | Status: DC | PRN

## 2014-02-18 MED ORDER — LORAZEPAM 2 MG/ML IJ SOLN
2.0000 mg | Freq: Once | INTRAMUSCULAR | Status: AC
  Administered 2014-02-18: 2 mg via INTRAMUSCULAR

## 2014-02-18 MED ORDER — ZIPRASIDONE MESYLATE 20 MG IM SOLR
20.0000 mg | Freq: Once | INTRAMUSCULAR | Status: AC
  Administered 2014-02-18: 20 mg via INTRAMUSCULAR

## 2014-02-18 MED ORDER — CEPHALEXIN 500 MG PO CAPS
500.0000 mg | ORAL_CAPSULE | Freq: Four times a day (QID) | ORAL | Status: DC
  Administered 2014-02-19: 500 mg via ORAL
  Filled 2014-02-18: qty 1

## 2014-02-18 MED ORDER — ALUM & MAG HYDROXIDE-SIMETH 200-200-20 MG/5ML PO SUSP: 30.0000 mL | ORAL | Status: DC | PRN

## 2014-02-18 MED ORDER — ZIPRASIDONE MESYLATE 20 MG IM SOLR
INTRAMUSCULAR | Status: AC
  Administered 2014-02-18: 20 mg via INTRAMUSCULAR
  Filled 2014-02-18: qty 20

## 2014-02-18 MED ORDER — NICOTINE 21 MG/24HR TD PT24: 21.0000 mg | MEDICATED_PATCH | Freq: Every day | TRANSDERMAL | Status: DC

## 2014-02-18 MED ORDER — DIPHENHYDRAMINE HCL 50 MG/ML IJ SOLN
50.0000 mg | Freq: Once | INTRAMUSCULAR | Status: AC
  Administered 2014-02-18: 50 mg via INTRAMUSCULAR
  Filled 2014-02-18: qty 1

## 2014-02-18 MED ORDER — IBUPROFEN 200 MG PO TABS: 600.0000 mg | ORAL_TABLET | Freq: Three times a day (TID) | ORAL | Status: DC | PRN

## 2014-02-18 MED ORDER — ZOLPIDEM TARTRATE 5 MG PO TABS: 5.0000 mg | ORAL_TABLET | Freq: Every evening | ORAL | Status: DC | PRN

## 2014-02-18 MED ORDER — LORAZEPAM 2 MG/ML IJ SOLN
INTRAMUSCULAR | Status: AC
  Administered 2014-02-18: 2 mg via INTRAMUSCULAR
  Filled 2014-02-18: qty 1

## 2014-02-18 NOTE — ED Notes (Signed)
Pt requesting both Bible and Quran. Pt is Labile.

## 2014-02-18 NOTE — BH Assessment (Signed)
[facility] Assessment Progress Note      Attempted to assess pt after consulted with Dr Elesa MassedWard who reports pt has SI and has been off of her medication.  Pt was unarousable.  Per patient nurse, Richelle, pt was given benadryl, ativan, and geodon for agitation earlier.  TTS will continue to follow.

## 2014-02-18 NOTE — ED Notes (Signed)
TTS in to attempt to assess patient. Patient unable to be assess at this time due response from medication given earlier.

## 2014-02-18 NOTE — ED Notes (Signed)
Patient states she cannot urinate at this time

## 2014-02-18 NOTE — ED Provider Notes (Signed)
TIME SEEN: 5:25 PM  CHIEF COMPLAINT: Suicidal ideation, homicidal ideation, psychosis  HPI: Patient is a 28 y.o. F with history of bipolar disorder, schizophrenia who has been off of her medication for several months who presents emergency department with her mother and police with complaints of suicidal ideation without a plan, but the line to hurt others, paranoia, hallucinations. Patient states she has not been on her medicine for several months because "I didn't feel like taking them". She states she's been smoking or warmth denies any other illicit drug use or alcohol use. Patient has been verbally aggressive with staff, threatening.  ROS: See HPI Constitutional: no fever  Eyes: no drainage  ENT: no runny nose   Cardiovascular:  no chest pain  Resp: no SOB  GI: no vomiting GU: no dysuria Integumentary: no rash  Allergy: no hives  Musculoskeletal: no leg swelling  Neurological: no slurred speech ROS otherwise negative  PAST MEDICAL HISTORY/PAST SURGICAL HISTORY:  Past Medical History  Diagnosis Date  . Anxiety   . Bipolar disorder   . Vaginal Pap smear, abnormal   . Gonorrhea   . Chlamydia     MEDICATIONS:  Prior to Admission medications   Not on File    ALLERGIES:  No Known Allergies  SOCIAL HISTORY:  History  Substance Use Topics  . Smoking status: Never Smoker   . Smokeless tobacco: Not on file  . Alcohol Use: 0.6 oz/week    1 Glasses of wine per week     Comment: socially    FAMILY HISTORY: No family history on file.  EXAM: BP 126/75  Pulse 87  Temp(Src) 98.3 F (36.8 C) (Oral)  Resp 20  SpO2 96%  LMP 11/11/2013 CONSTITUTIONAL: Alert and oriented and responds appropriately to questions. Well-appearing; well-nourished HEAD: Normocephalic EYES: Conjunctivae clear, PERRL ENT: normal nose; no rhinorrhea; moist mucous membranes; pharynx without lesions noted NECK: Supple, no meningismus, no LAD  CARD: RRR; S1 and S2 appreciated; no murmurs, no  clicks, no rubs, no gallops RESP: Normal chest excursion without splinting or tachypnea; breath sounds clear and equal bilaterally; no wheezes, no rhonchi, no rales,  ABD/GI: Normal bowel sounds; non-distended; soft, non-tender, no rebound, no guarding BACK:  The back appears normal and is non-tender to palpation, there is no CVA tenderness EXT: Normal ROM in all joints; non-tender to palpation; no edema; normal capillary refill; no cyanosis    SKIN: Normal color for age and race; warm NEURO: Moves all extremities equally PSYCH: Patient endorsing suicidal ideation, homicidal ideation. States she is having auditory hallucinations but will not tell me what they are. She is aggressive and threatening toward staff but calm, cooperative during my exam. Grooming and personal hygiene are appropriate.  MEDICAL DECISION MAKING: Patient here with admission of suicidal ideation, homicidal thoughts who is aggressive and threatening towards her staff, psychosis. She has no current medical complaints. Suspect this is secondary to her not taking her medications for several months. We'll obtain screening labs and urine. Will consult psychiatry. We'll take out IVC papers as I feel she is a danger to herself and others.  ED PROGRESS: Patient's labs are unremarkable. I am unable to reorder her medications but she cannot tell me what these medications are. Urine pending. Awaiting TTS consult.  10:11 PM  Pt has UTI - cultures pending. Will start Keflex. She is medically stable and awaiting psychiatry evaluation.   Layla MawKristen N Shalan Neault, DO 02/18/14 2211

## 2014-02-18 NOTE — ED Notes (Addendum)
Per pt, has been off psyche meds for awhile-patient paranoid, using profanity, argumentative, and hyper-religious

## 2014-02-19 ENCOUNTER — Encounter (HOSPITAL_COMMUNITY): Payer: Self-pay | Admitting: Emergency Medicine

## 2014-02-19 ENCOUNTER — Encounter (HOSPITAL_COMMUNITY): Payer: Self-pay

## 2014-02-19 ENCOUNTER — Inpatient Hospital Stay (HOSPITAL_COMMUNITY)
Admission: AD | Admit: 2014-02-19 | Discharge: 2014-02-26 | DRG: 885 | Disposition: A | Discharge status: Home / Self Care | Payer: Medicaid Other | Source: Intra-hospital | Attending: Psychiatry | Admitting: Psychiatry

## 2014-02-19 DIAGNOSIS — F29 Unspecified psychosis not due to a substance or known physiological condition: Secondary | ICD-10-CM | POA: Diagnosis present

## 2014-02-19 DIAGNOSIS — R45851 Suicidal ideations: Secondary | ICD-10-CM

## 2014-02-19 DIAGNOSIS — F259 Schizoaffective disorder, unspecified: Secondary | ICD-10-CM | POA: Diagnosis present

## 2014-02-19 DIAGNOSIS — F1220 Cannabis dependence, uncomplicated: Secondary | ICD-10-CM

## 2014-02-19 MED ORDER — QUETIAPINE FUMARATE 50 MG PO TABS
50.0000 mg | ORAL_TABLET | Freq: Every day | ORAL | Status: DC
  Administered 2014-02-19: 50 mg via ORAL
  Filled 2014-02-19: qty 1

## 2014-02-19 MED ORDER — QUETIAPINE FUMARATE 100 MG PO TABS: 100.0000 mg | ORAL_TABLET | Freq: Every day | ORAL | Status: DC

## 2014-02-19 MED ORDER — QUETIAPINE FUMARATE 100 MG PO TABS
100.0000 mg | ORAL_TABLET | Freq: Every day | ORAL | Status: DC
  Administered 2014-02-19 – 2014-02-20 (×2): 100 mg via ORAL
  Filled 2014-02-19 (×4): qty 1

## 2014-02-19 MED ORDER — LORAZEPAM 1 MG PO TABS
1.0000 mg | ORAL_TABLET | Freq: Three times a day (TID) | ORAL | Status: DC
  Administered 2014-02-20 (×3): 1 mg via ORAL
  Filled 2014-02-19 (×3): qty 1

## 2014-02-19 MED ORDER — QUETIAPINE FUMARATE 50 MG PO TABS
50.0000 mg | ORAL_TABLET | Freq: Every day | ORAL | Status: DC
  Administered 2014-02-20: 50 mg via ORAL
  Filled 2014-02-19 (×4): qty 1

## 2014-02-19 MED ORDER — NICOTINE 21 MG/24HR TD PT24
21.0000 mg | MEDICATED_PATCH | Freq: Every day | TRANSDERMAL | Status: DC
  Filled 2014-02-19 (×9): qty 1

## 2014-02-19 MED ORDER — MAGNESIUM HYDROXIDE 400 MG/5ML PO SUSP: 30.0000 mL | Freq: Every day | ORAL | Status: DC | PRN

## 2014-02-19 MED ORDER — LORAZEPAM 1 MG PO TABS
1.0000 mg | ORAL_TABLET | Freq: Three times a day (TID) | ORAL | Status: DC
  Administered 2014-02-19: 1 mg via ORAL
  Filled 2014-02-19: qty 1

## 2014-02-19 NOTE — ED Notes (Signed)
Pt shaking hands of other pt visitor and introducing herself. Pt interacting kindly with other patients.

## 2014-02-19 NOTE — Progress Notes (Signed)
   CARE MANAGEMENT ED NOTE 02/19/2014  Patient:  Makayla Medina,Makayla Medina   Account Number:  [id number]  Date Initiated:  02/19/2014  Documentation initiated by:  Edd ArbourGIBBS,Baruc Tugwell  Subjective/Objective Assessment:   28 yr old [place] pt without a pcp listed in EPIC Dx schizoafftective disorder has been off psych meds for awhile-patient paranoid, using profanity, argumentative, and hyper-religious     Subjective/Objective Assessment Detail:   Pt's scanned 11/2013 document CM inquired of pt about her pcp name She states she does not have one Pt at the time was at the nursing station window with the telephone in her hand Pt wave CM to her through window stating she wanted to speak with the "doctor  Pt stated she did not have a dr and then proceeded to say "I have nothing else to say to you. Leave. I am tired of talking to fucking people who can not help me"  Pt later observed inquiring of BH staff "Do any of you believe in God?" when no response from staff she walked over to security x 3 to inquire "do anyone of you speak another language?"     Action/Plan:   CM spoke to pt; Informed pt cm was not a doctor and inquired about her pcp CM left a list of medicaid [place] providers in pt's chart for d/c   Action/Plan Detail:   Anticipated DC Date:       Status Recommendation to Physician:   Result of Recommendation:    Other ED Services  Consult Working Plan    DC Associate Professorlanning Services  Other  Outpatient Services - Pt will follow up    Choice offered to / List presented to:            Status of service:  Completed, signed off  ED Comments:   ED Comments Detail:

## 2014-02-19 NOTE — ED Notes (Signed)
Pt in hall talking to herself then yelling in hall "my mom who works for [employer] makes me crazy!"

## 2014-02-19 NOTE — ED Notes (Signed)
Pt requesting to take a shower. Pt given shower items. Pt brought breakfast tray to nurses station and stated" Its to much I cant eat it all". Pt only ate one item.

## 2014-02-19 NOTE — Consult Note (Signed)
  Review of Systems  Constitutional: Negative.   HENT: Negative.   Eyes: Negative.   Respiratory: Negative.   Cardiovascular: Negative.   Gastrointestinal: Negative.   Genitourinary: Negative.   Musculoskeletal: Negative.   Skin: Negative.   Neurological: Negative.   Endo/Heme/Allergies: Negative.   Psychiatric/Behavioral: Negative.    No complaints about physical symptoms

## 2014-02-19 NOTE — ED Notes (Signed)
Pt singing loudly in shower.

## 2014-02-19 NOTE — [facility] Group Notes (Signed)
Adult Psychoeducational Group Note  Date:  02/19/2014 Time:  8:48 PM  Group Topic/Focus:  Wrap-Up Group:   The focus of this group is to help patients review their daily goal of treatment and discuss progress on daily workbooks.  Participation Level:  Did Not Attend  Participation Quality:  None  Affect:  None  Cognitive:  None  Insight: None  Engagement in Group:  None  Modes of Intervention:  Discussion  Additional Comments:  [name] did not attend group.  [name], [name] A 02/19/2014, 8:48 PM

## 2014-02-19 NOTE — ED Notes (Signed)
Pt performing Tai Chi in hall way

## 2014-02-19 NOTE — BH Assessment (Signed)
[facility] Shuvon Rankin and Dr. Ladona Ridgelaylor. Pt assigned to bed 400-1 accepted to the care of Dr.Akintayo.Pt is IVC'D.  Assessment Counselor   Glorious PeachNajah Dayron Odland, MS, LCASA Assessment Counselor

## 2014-02-19 NOTE — ED Notes (Signed)
Pt came up to nurses station asking police officer about isis.

## 2014-02-19 NOTE — ED Notes (Addendum)
Pt up to nurses station tearful. Pt asking for a radio. It was explained we dont have radios on the unit. Pt took tissue and went back to her room.  Pt laying in bed resting.

## 2014-02-19 NOTE — ED Notes (Signed)
Pt yeliing in hall waving her arms. "Can I get some Help in here!" pt standing at the bathroom door. Tech went in to assist. Pt states the water is cold you guys expect people to take cold showers in here?! The tech explained the water is like at home you have to give it a minute to heat up.

## 2014-02-19 NOTE — ED Notes (Signed)
Pt pulled the code blue button because she said she needs to see a psychiatrist.

## 2014-02-19 NOTE — ED Notes (Signed)
[facility] called to transport
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
## 2014-02-19 NOTE — ED Notes (Signed)
Pt yelling in the shower. Pt was check on and asked to sing a little lower. Pt lowered her voice.

## 2014-02-19 NOTE — Progress Notes (Signed)
Patient ID: Makayla FileBreana Medina, female   DOB: [birth year], 28 y.o.   MRN: [id number]  Pt states that she is here "took my mother's recommendation because she says I am hyperreligiosity because I love God too much", pt became agitated and defensive when asked about her medical history, did not want to answer many questions during the admission process states "you can look that information up, I don't feel like going over this again and again with you guys", oriented pt to unit and rules. Pt endorses SI/HI towards "all people who don't believe in Allah", +AH hearing "God's voice tells me to prepare people for his war.", denies VH.

## 2014-02-19 NOTE — Consult Note (Signed)
[place] [facility] Consult   Reason for Consult:  Reportedly psychotic and suicidal at times Referring Physician:  ER MD  Makayla Medina is an 28 y.o. female. Total Time spent with patient: 45 minutes  Assessment: AXIS I:  schizoaffective disorder AXIS II:  Deferred AXIS III:   Past Medical History  Diagnosis Date  . Anxiety   . Bipolar disorder   . Vaginal Pap smear, abnormal   . Gonorrhea   . Chlamydia    AXIS IV:  chronic psychosis and not taking meds AXIS V:  41-50 serious symptoms  Plan:  Recommend psychiatric Inpatient admission when medically cleared.  Subjective:   Makayla Medina is a 28 y.o. female patient admitted with psychosis and periodic suicidal thoughts.  HPI:  Ms bunning says she is here because of " hyperreligiousity".  Says she has not been taking medication because she did not like the way they made her feel. She was irritable, believes she is not too religious, got mad when I tried to explain hyperreligiousity when she asked what it was. She did not display any overt psychosis when we talked with her but has continued being inappropriate on the unit as reported in nursing notes. HPI Elements:   Location:  schizophrenia diagnosis and not taking medications. Quality:  reportedly overlypreoccupied with religion and making suicidal threats at home. Severity:  as above. Timing:  not taking medication for weeks to months. Duration:  as above. Context:  not taking medications.  Past Psychiatric History: Past Medical History  Diagnosis Date  . Anxiety   . Bipolar disorder   . Vaginal Pap smear, abnormal   . Gonorrhea   . Chlamydia     reports that she has never smoked. She does not have any smokeless tobacco history on file. She reports that she drinks about 0.6 ounces of alcohol per week. She reports that she uses illicit drugs (Marijuana). No family history on file.         Allergies:  No Known Allergies  ACT Assessment Complete:  Yes:     Educational Status    Risk to Self: Risk to self Is patient at risk for suicide?: No Substance abuse history and/or treatment for substance abuse?: Yes  Risk to Others:    Abuse:    Prior Inpatient Therapy:    Prior Outpatient Therapy:    Additional Information:                    Objective: Blood pressure 126/75, pulse 87, temperature 98.3 F (36.8 C), temperature source Oral, resp. rate 20, last menstrual period 11/11/2013, SpO2 96.00%.There is no weight on file to calculate BMI. Results for orders placed during the hospital encounter of 02/18/14 (from the past 72 hour(s))  CBC     Status: Abnormal   Collection Time    02/18/14  6:30 PM      Result Value Ref Range   WBC 10.0  4.0 - 10.5 K/uL   RBC 5.03  3.87 - 5.11 MIL/uL   Hemoglobin 15.2 (*) 12.0 - 15.0 g/dL   HCT 44.2  36.0 - 46.0 %   MCV 87.9  78.0 - 100.0 fL   MCH 30.2  26.0 - 34.0 pg   MCHC 34.4  30.0 - 36.0 g/dL   RDW 13.1  11.5 - 15.5 %   Platelets 220  150 - 400 K/uL  COMPREHENSIVE METABOLIC PANEL     Status: Abnormal   Collection Time    02/18/14  6:30  PM      Result Value Ref Range   Sodium 138  137 - 147 mEq/L   Potassium 3.6 (*) 3.7 - 5.3 mEq/L   Chloride 100  96 - 112 mEq/L   CO2 21  19 - 32 mEq/L   Glucose, Bld 100 (*) 70 - 99 mg/dL   BUN 12  6 - 23 mg/dL   Creatinine, Ser 0.90  0.50 - 1.10 mg/dL   Calcium 10.2  8.4 - 10.5 mg/dL   Total Protein 8.8 (*) 6.0 - 8.3 g/dL   Albumin 5.1  3.5 - 5.2 g/dL   AST 43 (*) 0 - 37 U/L   ALT 19  0 - 35 U/L   Alkaline Phosphatase 77  39 - 117 U/L   Total Bilirubin 0.7  0.3 - 1.2 mg/dL   GFR calc non Af Amer 87 (*) >90 mL/min   GFR calc Af Amer >90  >90 mL/min   Comment: (NOTE)     The eGFR has been calculated using the CKD EPI equation.     This calculation has not been validated in all clinical situations.     eGFR's persistently <90 mL/min signify possible Chronic Kidney     Disease.  SALICYLATE LEVEL     Status: Abnormal   Collection Time     02/18/14  6:30 PM      Result Value Ref Range   Salicylate Lvl <9.3 (*) 2.8 - 20.0 mg/dL  ACETAMINOPHEN LEVEL     Status: None   Collection Time    02/18/14  6:30 PM      Result Value Ref Range   Acetaminophen (Tylenol), Serum <15.0  10 - 30 ug/mL   Comment:            THERAPEUTIC CONCENTRATIONS VARY     SIGNIFICANTLY. A RANGE OF 10-30     ug/mL MAY BE AN EFFECTIVE     CONCENTRATION FOR MANY PATIENTS.     HOWEVER, SOME ARE BEST TREATED     AT CONCENTRATIONS OUTSIDE THIS     RANGE.     ACETAMINOPHEN CONCENTRATIONS     >150 ug/mL AT 4 HOURS AFTER     INGESTION AND >50 ug/mL AT 12     HOURS AFTER INGESTION ARE     OFTEN ASSOCIATED WITH TOXIC     REACTIONS.  URINALYSIS, ROUTINE W REFLEX MICROSCOPIC     Status: Abnormal   Collection Time    02/18/14  9:14 PM      Result Value Ref Range   Color, Urine YELLOW  YELLOW   APPearance CLOUDY (*) CLEAR   Specific Gravity, Urine 1.016  1.005 - 1.030   pH 6.5  5.0 - 8.0   Glucose, UA NEGATIVE  NEGATIVE mg/dL   Hgb urine dipstick LARGE (*) NEGATIVE   Bilirubin Urine NEGATIVE  NEGATIVE   Ketones, ur 15 (*) NEGATIVE mg/dL   Protein, ur NEGATIVE  NEGATIVE mg/dL   Urobilinogen, UA 0.2  0.0 - 1.0 mg/dL   Nitrite NEGATIVE  NEGATIVE   Leukocytes, UA MODERATE (*) NEGATIVE  URINE RAPID DRUG SCREEN (HOSP PERFORMED)     Status: Abnormal   Collection Time    02/18/14  9:14 PM      Result Value Ref Range   Opiates NONE DETECTED  NONE DETECTED   Cocaine NONE DETECTED  NONE DETECTED   Benzodiazepines NONE DETECTED  NONE DETECTED   Amphetamines NONE DETECTED  NONE DETECTED   Tetrahydrocannabinol POSITIVE (*) NONE DETECTED  Barbiturates NONE DETECTED  NONE DETECTED   Comment:            DRUG SCREEN FOR MEDICAL PURPOSES     ONLY.  IF CONFIRMATION IS NEEDED     FOR ANY PURPOSE, NOTIFY LAB     WITHIN 5 DAYS.                LOWEST DETECTABLE LIMITS     FOR URINE DRUG SCREEN     Drug Class       Cutoff (ng/mL)     Amphetamine      1000      Barbiturate      200     Benzodiazepine   737     Tricyclics       106     Opiates          300     Cocaine          300     THC              50  PREGNANCY, URINE     Status: None   Collection Time    02/18/14  9:14 PM      Result Value Ref Range   Preg Test, Ur NEGATIVE  NEGATIVE   Comment:            THE SENSITIVITY OF THIS     METHODOLOGY IS >20 mIU/mL.  URINE MICROSCOPIC-ADD ON     Status: Abnormal   Collection Time    02/18/14  9:14 PM      Result Value Ref Range   Squamous Epithelial / LPF RARE  RARE   WBC, UA 3-6  <3 WBC/hpf   RBC / HPF 7-10  <3 RBC/hpf   Bacteria, UA FEW (*) RARE   Labs are reviewed and are pertinent for THC.  Current Facility-Administered Medications  Medication Dose Route Frequency Provider Last Rate Last Dose  . acetaminophen (TYLENOL) tablet 650 mg  650 mg Oral Q4H PRN Kristen N Ward, DO      . alum & mag hydroxide-simeth (MAALOX/MYLANTA) 200-200-20 MG/5ML suspension 30 mL  30 mL Oral PRN Kristen N Ward, DO      . cephALEXin (KEFLEX) capsule 500 mg  500 mg Oral 4 times per day Kristen N Ward, DO   500 mg at 02/19/14 1250  . ibuprofen (ADVIL,MOTRIN) tablet 600 mg  600 mg Oral Q8H PRN Kristen N Ward, DO      . nicotine (NICODERM CQ - dosed in mg/24 hours) patch 21 mg  21 mg Transdermal Daily Kristen N Ward, DO      . ondansetron (ZOFRAN) tablet 4 mg  4 mg Oral Q8H PRN Kristen N Ward, DO      . zolpidem (AMBIEN) tablet 5 mg  5 mg Oral QHS PRN Kristen N Ward, DO       No current outpatient prescriptions on file.    Psychiatric Specialty Exam:     Blood pressure 126/75, pulse 87, temperature 98.3 F (36.8 C), temperature source Oral, resp. rate 20, last menstrual period 11/11/2013, SpO2 96.00%.There is no weight on file to calculate BMI.  General Appearance: Casual  Eye Contact::  Good  Speech:  Clear and Coherent  Volume:  Normal  Mood:  Irritable  Affect:  Congruent  Thought Process:  Coherent  Orientation:  Full (Time, Place, and Person)   Thought Content:  does not endorse any hallucinations or paranoia or delusions  Suicidal Thoughts:  No  Homicidal Thoughts:  No  Memory:  Immediate;   Fair Recent;   Fair Remote;   Fair  Judgement:  Impaired  Insight:  Lacking  Psychomotor Activity:  Normal  Concentration:  Good  Recall:  Justice of Knowledge:Good  Language: Good  Akathisia:  Negative  Handed:  Right  AIMS (if indicated):     Assets:  Communication Skills  Sleep:      Musculoskeletal: Strength & Muscle Tone: within normal limits Gait & Station: normal Patient leans: N/A  Treatment Plan Summary: Daily contact with patient to assess and evaluate symptoms and progress in treatment Medication management seek inpatient bed for treatment of psychosis  TAYLOR,GERALD D 02/19/2014 1:34 PM
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
## 2014-02-19 NOTE — ED Notes (Signed)
Pt asking for Bible and Quran.
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
## 2014-02-19 NOTE — ED Notes (Signed)
Pt squatting down with her hands on the floor looking into the drain of the bathroom floor.

## 2014-02-19 NOTE — ED Notes (Signed)
Pt woke up to eat meal tray.

## 2014-02-19 NOTE — ED Notes (Signed)
Unable to wake patient for 6:00 am medication. Respirations equal and unlabored, skin warm and dry. No acute distress noted.

## 2014-02-19 NOTE — ED Notes (Signed)
Pt pressed code blue button and yelled I asked to speak with a doctor. It was explained that the doctor is down the hall in another patients room at the moment. Pt go out of bed again and walked down the hall looking in pt rooms to find doctor. Pt walked back to her room and wrapped a blanket around her hair like a hair wrap. Pt walked into the bathroom and laid the blanket on the floor and asked for towels to take a shower. Pt walked out of bathroom and asked for clean sheets. It was asked what she was doing with her old sheets. Pt said she laid them on the floor so when she walks out of shower she has something to stand on.pt was given fresh towels and linen for her bed.

## 2014-02-19 NOTE — ED Notes (Signed)
Patient yelling, throwing things in her room. Patient cursing in hallway and came to nurses station demanding that we come to her room and then patient took off running to her room. Security and [employer] at bedside. Staff is unable to redirect patient at this time. Donell SievertSpencer Simon, PA contacted and informed of patient behavior and new orders received.

## 2014-02-19 NOTE — BH Assessment (Signed)
[facility] Assessment Progress Note   Clinician talked to RwandaVernita (MHT) about how alert patient was.  She said patient was not alert at all.  She was barely arousable when getting vitals.  Patient had been sedated to assist with patient becoming more coherent.  Patient will be assessed when more alert and oriented.
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
## 2014-02-19 NOTE — ED Notes (Signed)
Pt up at nurses station cussing at staff and yelling. Pt again yelling  I need a bible a [id number]!.

## 2014-02-20 DIAGNOSIS — R45851 Suicidal ideations: Secondary | ICD-10-CM

## 2014-02-20 DIAGNOSIS — F259 Schizoaffective disorder, unspecified: Principal | ICD-10-CM

## 2014-02-20 MED ORDER — TRAZODONE HCL 100 MG PO TABS
100.0000 mg | ORAL_TABLET | Freq: Every evening | ORAL | Status: DC | PRN
  Administered 2014-02-20: 100 mg via ORAL
  Filled 2014-02-20: qty 1

## 2014-02-20 MED ORDER — OLANZAPINE 5 MG PO TBDP: 5.0000 mg | ORAL_TABLET | Freq: Three times a day (TID) | ORAL | Status: DC | PRN

## 2014-02-20 NOTE — Progress Notes (Signed)
The focus of this group is to help patients review their daily goal of treatment and discuss progress on daily workbooks. Pt attended the evening group session and responded to all discussion prompts from the Writer. Pt reported having had a good day on the unit, the highlight of which was talking to Allah. Pt requested a Koran from Nursing Staff, but was informed that this is not a service we provide. Pt also requested she be given her religious head covering, but [facility], she was not allowed to have it due to it being considered potentially dangerous and contraband. Pt told her peers that she used to work for the [employer] (as in [employer]) and knew how they operated. Pt's affect was bright, though she presented with disorganized thinking.

## 2014-02-20 NOTE — H&P (Signed)
Psychiatric Admission Assessment Adult  Patient Identification:  Makayla Medina Date of Evaluation:  02/20/2014 Chief Complaint:  "I am hearing the voice of Allah."  History of Present Illness::  Makayla Medina is a 28 year old female who presented to the [facility] with her mother and police under IVC with complaints of suicidal ideation, paranoia, and hallucinations. Patient had not been taking her psychiatric medications for several months but instead had been smoking marijuana. She reports the following today during her psychiatric assessment "My mother is scared of my Islam faith. She flips out about it. I just need relaxation. I drink and use marijuana because Allah tells me it's fine. I've just converted to Islam. 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 That's the problem with [place] people don't value spirituality. I was also uncovering lies that were being kept secret at [facility]. I guess people do not like that either. I spend my time learning that Koran." Makayla Medina is very hyper-religious throughout her assessment. She becomes very irritable when redirected back to why she was brought to the hospital. The patient brought a bed-sheet to meet with treatment team. During her assessment she wrapped the sheet around her head and reduced her eye contact. Patient was challenged by treatment team members that the Koran does not encourage drug or alcohol use. Her speech is very pressured, tangential, and disorganized. She continually uses passages from the Koran to justify her actions and beliefs.   Elements:  Location:  Psychosis, Manic behaviors . Quality:  Aggression, hyperreligious, very paranoid . Severity:  Severe . Timing:  Last few months. Duration:  History of chronic mental illness . Context:  Off psychiatric medications with reemergence of symptoms. . Associated Signs/Synptoms: Depression Symptoms:  anxiety, (Hypo) Manic Symptoms:  Distractibility, Elevated Mood, Flight of  Ideas, Hallucinations, Impulsivity, Irritable Mood, Labiality of Mood, Anxiety Symptoms:  Denies Psychotic Symptoms:  Hallucinations: Auditory Paranoia, PTSD Symptoms: Denies Total Time spent with patient: 45 minutes  Psychiatric Specialty Exam: Physical Exam  Constitutional:  Physical exam findings reviewed from [facility] and I concur with no noted exceptions.   Psychiatric: Her mood appears anxious. Her affect is labile. Her speech is rapid and/or pressured. She is agitated and actively hallucinating. Thought content is paranoid. She expresses impulsivity. She is inattentive.    Review of Systems  Constitutional: Negative.   HENT: Negative.   Eyes: Negative.   Respiratory: Negative.   Cardiovascular: Negative.   Gastrointestinal: Negative.   Genitourinary: Negative.   Musculoskeletal: Negative.   Skin: Negative.   Neurological: Negative.   Endo/Heme/Allergies: Negative.   Psychiatric/Behavioral: Positive for hallucinations and substance abuse. Negative for depression, suicidal ideas and memory loss. The patient is nervous/anxious and has insomnia.     Blood pressure 116/72, pulse 91, temperature 98.1 F (36.7 C), temperature source Oral, resp. rate 16, height 5' 5.5" (1.664 m), weight 79.833 kg (176 lb), last menstrual period 01/19/2014, unknown if currently breastfeeding.Body mass index is 28.83 kg/(m^2).  General Appearance: Fairly Groomed  Engineer, water::  Minimal  Speech:  Pressured  Volume:  Increased  Mood:  Anxious and Irritable  Affect:  Full Range  Thought Process:  Disorganized  Orientation:  Full (Time, Place, and Person)  Thought Content:  Hallucinations: Auditory and Paranoid Ideation  Suicidal Thoughts:  Yes.  without intent/plan  Homicidal Thoughts:  No  Memory:  Immediate;   Fair Recent;   Fair Remote;   Fair  Judgement:  Impaired  Insight:  Lacking  Psychomotor Activity:  Increased  Concentration:  Fair  Recall:  [employer] [place]: Good  Akathisia:  No  Handed:  Right  AIMS (if indicated):     Assets:  Communication Skills Desire for Improvement Physical Health  Sleep:  Number of Hours: 5.75    Musculoskeletal: Strength & Muscle Tone: within normal limits Gait & Station: normal Patient leans: N/A  Past Psychiatric History:Yes Diagnosis: Schizoaffective   Hospitalizations: [facility] 2005, [facility]   Outpatient Care: None currently   Substance Abuse Care: Denies   Self-Mutilation: Denies  Suicidal Attempts:Overdosed on pills in high school  Violent Behaviors: Potential when off medications    Past Medical History:   Past Medical History  Diagnosis Date  . Anxiety   . Bipolar disorder   . Vaginal Pap smear, abnormal   . Gonorrhea   . Chlamydia    None. Allergies:  No Known Allergies PTA Medications: No prescriptions prior to admission    Previous Psychotropic Medications:  Medication/Dose  Risperdal               Substance Abuse History in the last 12 months:  yes  Consequences of Substance Abuse: Patient has been smoking marijuana with possible worsening of mental health symptoms.   Social History:  reports that she has never smoked. She does not have any smokeless tobacco history on file. She reports that she drinks about 0.6 ounces of alcohol per week. She reports that she uses illicit drugs (Marijuana). Additional Social History:                      Current Place of Residence:   Place of Birth:   Family Members: Marital Status:  Single Children:1  Sons:  Daughters:1 Relationships: Education:  "Some college" Educational Problems/Performance: Religious Beliefs/Practices: History of Abuse (Emotional/Phsycial/Sexual) Occupational Experiences; Military History:  None. Legal History: Hobbies/Interests:  Family History:  History reviewed. No pertinent family history. Patient denies history of mental illness in her family.   Results for orders placed  during the hospital encounter of 02/18/14 (from the past 72 hour(s))  CBC     Status: Abnormal   Collection Time    02/18/14  6:30 PM      Result Value Ref Range   WBC 10.0  4.0 - 10.5 K/uL   RBC 5.03  3.87 - 5.11 MIL/uL   Hemoglobin 15.2 (*) 12.0 - 15.0 g/dL   HCT 44.2  36.0 - 46.0 %   MCV 87.9  78.0 - 100.0 fL   MCH 30.2  26.0 - 34.0 pg   MCHC 34.4  30.0 - 36.0 g/dL   RDW 13.1  11.5 - 15.5 %   Platelets 220  150 - 400 K/uL  COMPREHENSIVE METABOLIC PANEL     Status: Abnormal   Collection Time    02/18/14  6:30 PM      Result Value Ref Range   Sodium 138  137 - 147 mEq/L   Potassium 3.6 (*) 3.7 - 5.3 mEq/L   Chloride 100  96 - 112 mEq/L   CO2 21  19 - 32 mEq/L   Glucose, Bld 100 (*) 70 - 99 mg/dL   BUN 12  6 - 23 mg/dL   Creatinine, Ser 0.90  0.50 - 1.10 mg/dL   Calcium 10.2  8.4 - 10.5 mg/dL   Total Protein 8.8 (*) 6.0 - 8.3 g/dL   Albumin 5.1  3.5 - 5.2 g/dL   AST 43 (*) 0 - 37 U/L  ALT 19  0 - 35 U/L   Alkaline Phosphatase 77  39 - 117 U/L   Total Bilirubin 0.7  0.3 - 1.2 mg/dL   GFR calc non Af Amer 87 (*) >90 mL/min   GFR calc Af Amer >90  >90 mL/min   Comment: (NOTE)     The eGFR has been calculated using the CKD EPI equation.     This calculation has not been validated in all clinical situations.     eGFR's persistently <90 mL/min signify possible Chronic Kidney     Disease.  SALICYLATE LEVEL     Status: Abnormal   Collection Time    02/18/14  6:30 PM      Result Value Ref Range   Salicylate Lvl <1.2 (*) 2.8 - 20.0 mg/dL  ACETAMINOPHEN LEVEL     Status: None   Collection Time    02/18/14  6:30 PM      Result Value Ref Range   Acetaminophen (Tylenol), Serum <15.0  10 - 30 ug/mL   Comment:            THERAPEUTIC CONCENTRATIONS VARY     SIGNIFICANTLY. A RANGE OF 10-30     ug/mL MAY BE AN EFFECTIVE     CONCENTRATION FOR MANY PATIENTS.     HOWEVER, SOME ARE BEST TREATED     AT CONCENTRATIONS OUTSIDE THIS     RANGE.     ACETAMINOPHEN CONCENTRATIONS     >150  ug/mL AT 4 HOURS AFTER     INGESTION AND >50 ug/mL AT 12     HOURS AFTER INGESTION ARE     OFTEN ASSOCIATED WITH TOXIC     REACTIONS.  URINALYSIS, ROUTINE W REFLEX MICROSCOPIC     Status: Abnormal   Collection Time    02/18/14  9:14 PM      Result Value Ref Range   Color, Urine YELLOW  YELLOW   APPearance CLOUDY (*) CLEAR   Specific Gravity, Urine 1.016  1.005 - 1.030   pH 6.5  5.0 - 8.0   Glucose, UA NEGATIVE  NEGATIVE mg/dL   Hgb urine dipstick LARGE (*) NEGATIVE   Bilirubin Urine NEGATIVE  NEGATIVE   Ketones, ur 15 (*) NEGATIVE mg/dL   Protein, ur NEGATIVE  NEGATIVE mg/dL   Urobilinogen, UA 0.2  0.0 - 1.0 mg/dL   Nitrite NEGATIVE  NEGATIVE   Leukocytes, UA MODERATE (*) NEGATIVE  URINE RAPID DRUG SCREEN (HOSP PERFORMED)     Status: Abnormal   Collection Time    02/18/14  9:14 PM      Result Value Ref Range   Opiates NONE DETECTED  NONE DETECTED   Cocaine NONE DETECTED  NONE DETECTED   Benzodiazepines NONE DETECTED  NONE DETECTED   Amphetamines NONE DETECTED  NONE DETECTED   Tetrahydrocannabinol POSITIVE (*) NONE DETECTED   Barbiturates NONE DETECTED  NONE DETECTED   Comment:            DRUG SCREEN FOR MEDICAL PURPOSES     ONLY.  IF CONFIRMATION IS NEEDED     FOR ANY PURPOSE, NOTIFY LAB     WITHIN 5 DAYS.                LOWEST DETECTABLE LIMITS     FOR URINE DRUG SCREEN     Drug Class       Cutoff (ng/mL)     Amphetamine      1000     Barbiturate  200     Benzodiazepine   242     Tricyclics       683     Opiates          300     Cocaine          300     THC              50  PREGNANCY, URINE     Status: None   Collection Time    02/18/14  9:14 PM      Result Value Ref Range   Preg Test, Ur NEGATIVE  NEGATIVE   Comment:            THE SENSITIVITY OF THIS     METHODOLOGY IS >20 mIU/mL.  URINE MICROSCOPIC-ADD ON     Status: Abnormal   Collection Time    02/18/14  9:14 PM      Result Value Ref Range   Squamous Epithelial / LPF RARE  RARE   WBC, UA 3-6  <3  WBC/hpf   RBC / HPF 7-10  <3 RBC/hpf   Bacteria, UA FEW (*) RARE  URINE CULTURE     Status: None   Collection Time    02/18/14  9:14 PM      Result Value Ref Range   Specimen Description URINE, RANDOM     Special Requests NONE     Culture  Setup Time       Value: 02/19/2014 02:58     Performed at [place] PENDING     Culture       Value: Culture reincubated for better growth     Performed at [facility]   Report Status PENDING     Psychological Evaluations:  Assessment:   DSM5:  Schizophrenia Disorders:   Obsessive-Compulsive Disorders:   Trauma-Stressor Disorders:   Substance/Addictive Disorders:  Cannabis Use Disorder - Moderate 9304.30) Depressive Disorders:    AXIS I:  Schizoaffective disorder-bipolar type  AXIS II:  Deferred AXIS III:   Past Medical History  Diagnosis Date  . Anxiety   . Bipolar disorder   . Vaginal Pap smear, abnormal   . Gonorrhea   . Chlamydia    AXIS IV:  economic problems, other psychosocial or environmental problems and problems with primary support group AXIS V:  31-40 impairment in reality testing   Treatment Plan/Recommendations:   1. Admit for crisis management and stabilization. Estimated length of stay 5-7 days. 2. Medication management to reduce current symptoms to base line and improve the patient's level of functioning. Trazodone initiated to help improve sleep. 3. Develop treatment plan to decrease risk of relapse upon discharge of depressive symptoms and the need for readmission. 5. Group therapy to facilitate development of healthy coping skills to use for psychosis.  6. Health care follow up as needed for medical problems.  7. Discharge plan to include therapy to help patient cope with stressors.  8. Call for Consult with Hospitalist for additional specialty patient services as needed.   Treatment Plan Summary: Daily contact with patient to assess and evaluate symptoms and progress in  treatment Medication management Current Medications:  Current Facility-Administered Medications  Medication Dose Route Frequency Provider Last Rate Last Dose  . LORazepam (ATIVAN) tablet 1 mg  1 mg Oral TID Shuvon Rankin, NP   1 mg at 02/20/14 0744  . magnesium hydroxide (MILK OF MAGNESIA) suspension 30 mL  30 mL Oral Daily PRN Shuvon Rankin,  NP      . nicotine (NICODERM CQ - dosed in mg/24 hours) patch 21 mg  21 mg Transdermal Daily Shuvon Rankin, NP      . OLANZapine zydis (ZYPREXA) disintegrating tablet 5 mg  5 mg Oral Q8H PRN Elmarie Shiley, NP      . QUEtiapine (SEROQUEL) tablet 100 mg  100 mg Oral QHS Shuvon Rankin, NP   100 mg at 02/19/14 2139  . QUEtiapine (SEROQUEL) tablet 50 mg  50 mg Oral Daily Shuvon Rankin, NP   50 mg at 02/20/14 0744  . traZODone (DESYREL) tablet 100 mg  100 mg Oral QHS PRN Elmarie Shiley, NP        Observation Level/Precautions:  15 minute checks  Laboratory:  CBC Chemistry Profile UDS UA  Psychotherapy:  Individual and Group Therapy  Medications:  Ativan 1 mg TID for anxiety, Seroquel 100 mg hs and 50 mg in the morning for improved mood stability/psychosis, Zyprexa Zydis 5 mg every eight hours prn agitation   Consultations:  As needed  Discharge Concerns:  Safety and Stability   Estimated LOS: 5-7 days  Other:  Increase collateral information from family    I certify that inpatient services furnished can reasonably be expected to improve the patient's condition.   Elmarie Shiley NP-C 4/1/201512:03 PM   Patient seen, evaluated and I agree with notes by Nurse Practitioner. Corena Pilgrim, MD

## 2014-02-20 NOTE — Progress Notes (Signed)
Patient ID: Makayla Medina, female   DOB: [birth year], 28 y.o.   MRN: [id number] D: Patient is pacing the hallway wearing a sheet on her head.  She continues to request a Koran.  Her speech is pressure and disorganized. She states that "hears the voice of Allah."  She is resistant to taking her medications, however, she has been compliant.  She denies any AVH/SI/HI.  A: continue to monitor medication management and MD orders.  Safety checks completed every 15 minutes per protocol.  R: patient is cooperative.

## 2014-02-20 NOTE — [facility] Group Notes (Signed)
[facility]  02/20/2014  11:19 AM  Type of Therapy:  [facility] Presentation   Participation Level:  Minimal  Participation Quality:  Intrusive  Affect:  Blunted  Cognitive:  Disorganized  Insight:  Lacking  Engagement in Therapy:  Lacking  Modes of Intervention:  Discussion, Education and Socialization   Summary of Progress/Problems:  Makayla Medina from [facility] came to present his recovery story and play the guitar.  Makayla Medina came to group in the last 15 minutes and stood near the window.  She apologized to the group and stated "Nobody woke me up.  The doctor has me on too much medication.  It's making me sleepy."  When a group member asked about poetry, Makayla Medina blurted out "I know people who work in the [employer] department.  I can give you their numbers."  She applauded after every song and thanked the speaker for playing his guitar.      Makayla Medina   02/20/2014  11:19 AM

## 2014-02-20 NOTE — Tx Team (Signed)
  Interdisciplinary Treatment Plan Update   Date Reviewed: 02/20/2014 Time Reviewed:  10:55 AM  Progress in Treatment:   Attending groups: Yes Participating in groups: Yes Taking medication as prescribed: Yes  Tolerating medication: Yes Family/Significant other contact made: No  Patient understands diagnosis: Yes, AEB asking help with medication management.  Discussing patient identified problems/goals with staff: Yes, See initial care plan.  Medical problems stabilized or resolved: Yes Denies suicidal/homicidal ideation: Yes, In txt team   Patient has not harmed self or others: Yes For review of initial/current patient goals, please see plan of care.  Estimated Length of Stay:  4-5 days   Reasons for Continued Hospitalization:  Delusions  Medication stabilization Suicidal ideation   New Problems/Goals identified:  N/A   Discharge Plan or Barriers:  No plan identified yet.  Will assess further for appropriate services.    Additional Comments: Pt states that she is here "took my mother's recommendation because she says I am hyperreligiosity because I love God too much", pt became agitated and defensive when asked about her medical history, did not want to answer many questions during the admission process states "you can look that information up, I don't feel like going over this again and again with you guys."  Pt endorses SI/HI towards "all people who don't believe in Allah", +AH hearing "God's voice tells me to prepare people for his war.", denies VH.   Attendees:  Signature: Thedore MinsMojeed Akintayo, MD  12/17/2013 8:10 AM   Signature: Richelle Itood Adaria Hole, LCSW  12/17/2013 8:10 AM   Signature: Fransisca KaufmannLaura Davis, NP  12/17/2013 8:10 AM   Signature: Joslyn Devonaroline Beaudry, RN  12/17/2013 8:10 AM   Signature: Liborio NixonPatrice White, RN  12/17/2013 8:10 AM   Signature:  12/17/2013 8:10 AM   Signature:  12/17/2013 8:10 AM   Signature:    Signature:    Signature:    Signature:    Signature:    Signature:      Scribe for  Treatment Team:   Simona HuhYang, Tina, 02/20/2014, 10:55 AM

## 2014-02-20 NOTE — [facility] Suicide Risk Assessment (Signed)
   Nursing information obtained from:    Demographic factors:    Current Mental Status:    Loss Factors:    Historical Factors:    Risk Reduction Factors:    Total Time spent with patient: 30 minutes  CLINICAL FACTORS:   Severe Anxiety and/or Agitation Bipolar Disorder:   Bipolar II Alcohol/Substance Abuse/Dependencies Schizophrenia:   Less than 27 years old Paranoid or undifferentiated type Currently Psychotic  Psychiatric Specialty Exam: Physical Exam  Psychiatric: Her affect is labile. Her speech is rapid and/or pressured. She is aggressive and actively hallucinating. Thought content is paranoid. Cognition and memory are normal. She expresses impulsivity. She expresses suicidal ideation. She is inattentive.    Review of Systems  Constitutional: Negative.   HENT: Negative.   Eyes: Negative.   Respiratory: Negative.   Cardiovascular: Negative.   Gastrointestinal: Negative.   Genitourinary: Negative.   Musculoskeletal: Negative.   Skin: Negative.   Neurological: Negative.   Endo/Heme/Allergies: Negative.   Psychiatric/Behavioral: Positive for suicidal ideas, hallucinations and substance abuse. The patient is nervous/anxious and has insomnia.     Blood pressure 116/72, pulse 91, temperature 98.1 F (36.7 C), temperature source Oral, resp. rate 16, height 5' 5.5" (1.664 m), weight 79.833 kg (176 lb), last menstrual period 01/19/2014, unknown if currently breastfeeding.Body mass index is 28.83 kg/(m^2).  General Appearance: Fairly Groomed  Patent attorneyye Contact::  Minimal  Speech:  Pressured  Volume:  Increased  Mood:  Anxious and Irritable  Affect:  Full Range  Thought Process:  Disorganized  Orientation:  Full (Time, Place, and Person)  Thought Content:  Hallucinations: Auditory and Paranoid Ideation  Suicidal Thoughts:  Yes.  without intent/plan  Homicidal Thoughts:  No  Memory:  Immediate;   Fair Recent;   Fair Remote;   Fair  Judgement:  Impaired  Insight:  Lacking   Psychomotor Activity:  Increased  Concentration:  Fair  Recall:  [employer]  Fund of Knowledge:Fair  Language: Good  Akathisia:  No  Handed:  Right  AIMS (if indicated):     Assets:  Communication Skills Desire for Improvement Physical Health  Sleep:  Number of Hours: 5.75   Musculoskeletal: Strength & Muscle Tone: within normal limits Gait & Station: normal Patient leans: N/A  COGNITIVE FEATURES THAT CONTRIBUTE TO RISK:  Closed-mindedness Polarized thinking    SUICIDE RISK:   Mild:  Suicidal ideation of limited frequency, intensity, duration, and specificity.  There are no identifiable plans, no associated intent, mild dysphoria and related symptoms, good self-control (both objective and subjective assessment), few other risk factors, and identifiable protective factors, including available and accessible social support.  PLAN OF CARE:1. Admit for crisis management and stabilization. 2. Medication management to reduce current symptoms to base line and improve the     patient's overall level of functioning 3. Treat health problems as indicated. 4. Develop treatment plan to decrease risk of relapse upon discharge and the need for     readmission. 5. Psycho-social education regarding relapse prevention and self care. 6. Health care follow up as needed for medical problems. 7. Restart home medications where appropriate.   I certify that inpatient services furnished can reasonably be expected to improve the patient's condition.  [name], Tery Hoeger, MD 02/20/2014, 9:37 AM

## 2014-02-20 NOTE — Progress Notes (Signed)
D   Pt was cooperative and pleasant on approach   She continues to ask for bibles and the Koran and said she wanted a scarf or something to cover her head because of her religious beliefs   She also said she is glad she came her because this is where she should have come to start with   She took a shower and apologized for getting water on the floor   She requested to be woke up for all meals and groups because she does not want anything to be counted against her A   Verbal support given   Medications administered and effectiveness monitored  Provided pt with covering for her head   Q 15 min checks R   Pt safe at present

## 2014-02-20 NOTE — [facility] Group Notes (Signed)
[facility]   02/20/2014 10:59 AM  Participation Quality:  Minimal  Mood/Affect:  Flat  Depression Rating:  unk  Anxiety Rating:  unk  Thoughts of Suicide:  No Will you contract for safety?   NA  Current AVH:  Denies  Plan for Discharge/Comments:  Makayla PickleBreana made her way to group after asking for help getting out of bed.  I asked for help from a female staff member and left her.  She appeared in group with a blanket over her head like a scarf.  She asked many irrelevent questions, and offered nothing of substance that would help us piece together why she is here, other than giving the clear impression that she is delusional and disorganized with speech that is somewhat pressured.   Transportation Means: unk  Supports: unk  [employer] Corporationorth, Makayla Medina

## 2014-02-21 DIAGNOSIS — F1210 Cannabis abuse, uncomplicated: Secondary | ICD-10-CM

## 2014-02-21 LAB — URINE CULTURE: Colony Count: >=100000

## 2014-02-21 MED ORDER — TRAZODONE HCL 100 MG PO TABS
100.0000 mg | ORAL_TABLET | Freq: Every day | ORAL | Status: DC
  Administered 2014-02-21 – 2014-02-25 (×5): 100 mg via ORAL
  Filled 2014-02-21 (×6): qty 1

## 2014-02-21 MED ORDER — LURASIDONE HCL 40 MG PO TABS
40.0000 mg | ORAL_TABLET | Freq: Every day | ORAL | Status: DC
  Administered 2014-02-21 – 2014-02-25 (×5): 40 mg via ORAL
  Filled 2014-02-21 (×6): qty 1

## 2014-02-21 MED ORDER — LORAZEPAM 1 MG PO TABS
1.0000 mg | ORAL_TABLET | Freq: Four times a day (QID) | ORAL | Status: DC | PRN
  Administered 2014-02-22 – 2014-02-24 (×2): 1 mg via ORAL
  Filled 2014-02-21 (×2): qty 1

## 2014-02-21 MED ORDER — BENZTROPINE MESYLATE 0.5 MG PO TABS
0.5000 mg | ORAL_TABLET | Freq: Every day | ORAL | Status: DC
  Administered 2014-02-21 – 2014-02-25 (×5): 0.5 mg via ORAL
  Filled 2014-02-21 (×6): qty 1

## 2014-02-21 MED ORDER — OLANZAPINE 10 MG PO TBDP
10.0000 mg | ORAL_TABLET | Freq: Three times a day (TID) | ORAL | Status: DC | PRN
  Administered 2014-02-22 – 2014-02-24 (×3): 10 mg via ORAL
  Filled 2014-02-21: qty 2
  Filled 2014-02-21 (×2): qty 1
  Filled 2014-02-21: qty 2

## 2014-02-21 NOTE — Progress Notes (Signed)
[facility]  02/21/2014 11:36 AM Makayla Medina  MRN:  [id number] Subjective: " I am still upset that my mother has to put me in the hospital.'' Objective: Patient seen and chart is reviewed. She continues to endorsed delusions, paranoia and auditory hallucinations. She remains religiously preoccupied, easily agitated and labile. Her thought process is disorganized and bizarre. However, she denies suicidal ideation, intent or plan. Patient is compliant with her medications and has not endorsed any adverse reactions. 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 Anxiety   . Bipolar disorder   . Vaginal Pap smear, abnormal   . Gonorrhea   . Chlamydia    Axis IV: other psychosocial or environmental problems and problems related to social environment  ADL's:  Intact  Sleep: Good  Appetite:  Fair  Suicidal Ideation: denies  Homicidal Ideation:  denies AEB (as evidenced by):  Psychiatric Specialty Exam: Physical Exam  Psychiatric: Her affect is labile. Her speech is rapid and/or pressured and tangential. She is actively hallucinating. Thought content is paranoid. Cognition and memory are normal. She expresses impulsivity.    Review of Systems  Constitutional: Negative.   HENT: Negative.   Eyes: Negative.   Respiratory: Negative.   Cardiovascular: Negative.   Gastrointestinal: Negative.   Genitourinary: Negative.   Musculoskeletal: Negative.   Skin: Negative.   Neurological: Negative.   Endo/Heme/Allergies: Negative.    Psychiatric/Behavioral: Positive for hallucinations and substance abuse. The patient is nervous/anxious and has insomnia.     Blood pressure 111/72, pulse 103, temperature 97.9 F (36.6 C), temperature source Oral, resp. rate 20, height 5' 5.5" (1.664 m), weight 79.833 kg (176 lb), last menstrual period 01/19/2014, unknown if currently breastfeeding.Body mass index is 28.83 kg/(m^2).  General Appearance: Fairly Groomed  Patent attorneyye Contact::  Minimal  Speech:  Pressured  Volume:  Increased  Mood:  Irritable  Affect:  Labile and Full Range  Thought Process:  Circumstantial and Disorganized  Orientation:  Full (Time, Place, and Person)  Thought Content:  Delusions and Hallucinations: Auditory  Suicidal Thoughts:  No  Homicidal Thoughts:  No  Memory:  Immediate;   Fair Recent;   Fair Remote;   Fair  Judgement:  Impaired  Insight:  Shallow  Psychomotor Activity:  Increased  Concentration:  Fair  Recall:  FiservFair  Fund of Knowledge:Good  Language: Good  Akathisia:  No  Handed:  Right  AIMS (if indicated):     Assets:  Communication Skills Desire for Improvement Physical Health  Sleep:  Number of Hours: 5.75   Musculoskeletal: Strength & Muscle Tone: within normal limits Gait & Station: normal Patient leans: N/A  Current Medications: Current Facility-Administered Medications  Medication Dose Route Frequency Provider Last Rate Last Dose  . benztropine (COGENTIN) tablet 0.5 mg  0.5 mg Oral QPC supper Dartagnan Beavers      . LORazepam (ATIVAN) tablet 1 mg  1 mg Oral Q6H PRN Evony Rezek      . lurasidone (LATUDA) tablet 40 mg  40 mg Oral QPC supper Renatta Shrieves      . magnesium hydroxide (MILK OF MAGNESIA)  suspension 30 mL  30 mL Oral Daily PRN Shuvon Rankin, NP      . nicotine (NICODERM CQ - dosed in mg/24 hours) patch 21 mg  21 mg Transdermal Daily Shuvon Rankin, NP      . OLANZapine zydis (ZYPREXA) disintegrating tablet 10 mg  10 mg Oral Q8H PRN Jessa Stinson      . traZODone  (DESYREL) tablet 100 mg  100 mg Oral QHS Timtohy Broski        Lab Results: No results found for this or any previous visit (from the past 48 hour(s)).  Physical Findings: AIMS: Facial and Oral Movements Muscles of Facial Expression: None, normal Lips and Perioral Area: None, normal Jaw: None, normal Tongue: None, normal,Extremity Movements Upper (arms, wrists, hands, fingers): None, normal Lower (legs, knees, ankles, toes): None, normal, Trunk Movements Neck, shoulders, hips: None, normal, Overall Severity Severity of abnormal movements (highest score from questions above): None, normal Incapacitation due to abnormal movements: None, normal Patient's awareness of abnormal movements (rate only patient's report): No Awareness, Dental Status Current problems with teeth and/or dentures?: No Does patient usually wear dentures?: No  CIWA:    COWS:     Treatment Plan Summary: Daily contact with patient to assess and evaluate symptoms and progress in treatment Medication management  Plan:1. Admit for crisis management and stabilization. 2. Medication management to reduce current symptoms to base line and improve the  patient's overall level of functioning:   -Discontinue Seroquel -Initiate Latuda 40mg  daily after supper for mood/delusions/psychosis -Cogentin 0.5mg  daily after supper for EPS prevention -Continue Trazodone 100mg  qhs as needed for insomnia 3. Treat health problems as indicated. 4. Develop treatment plan to decrease risk of relapse upon discharge and the need for  readmission. 5. Psycho-social education regarding relapse prevention and self care. 6. Health care follow up as needed for medical problems. 7. Restart home medications where appropriate.   Medical Decision Making Problem Points:  Established problem, worsening (2), Review of last therapy session (1) and Review of psycho-social stressors (1) Data Points:  Order Aims Assessment (2) Review or order clinical  lab tests (1) Review of medication regiment & side effects (2) Review of new medications or change in dosage (2)  I certify that inpatient services furnished can reasonably be expected to improve the patient's condition.   Thedore Mins, MD 02/21/2014, 11:36 AM
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
## 2014-02-21 NOTE — Progress Notes (Signed)
Patient ID: Makayla Medina, female   DOB: 05-Dec-1985, 28 y.o.   MRN: [id number] D: patient continues to report auditory hallucinations.  She states, "Allah talks to me.  My mother thinks I'm too religious.  She thought I took too many showers."  Patient remains somewhat disorganized, however, her thought processes seem improved today.  She is speaking more logically and organized.  She denies any SI/HI.  Patient had asked for a scarf to wear on her head.  Explained the rules of what patients can have.  She is wearing a towel on her head instead.  A: continue to monitor medication management and MD orders.  Safety checks completed every 15 minutes per protocol.  R: patient is receptive to staff; her behavior is appropriate.
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
## 2014-02-21 NOTE — Progress Notes (Signed)
Adult Psychoeducational Group Note  Date:  02/21/2014 Time:  8:00 pm  Group Topic/Focus:  Wrap-Up Group:   The focus of this group is to help patients review their daily goal of treatment and discuss progress on daily workbooks.  Participation Level:  Active  Participation Quality:  Appropriate and Sharing  Affect:  Appropriate  Cognitive:  Appropriate  Insight: Appropriate  Engagement in Group:  Engaged  Modes of Intervention:  Discussion, Education, Socialization and Support  Additional Comments:  Pt stated that she had a good day.  Pt stated that she had a visit with her family that lifted her spirits. Pt stated that her goal is to work on controlling her anger.   Laural BenesJohnson, Rilyn Upshaw 02/21/2014, 8:53 PM

## 2014-02-21 NOTE — Progress Notes (Signed)
D: Pt very religiously preoccupied. She states that she just converted to Islam. Has been requesting her religous covering for her head. She denies SI/HI/AVH. She has disorganized thinking. She has been easily redirected.  A: Support given. Verbalization encouraged. Medications given as prescribed. Pt encouraged to come to staff with any concerns. Pt explained why she cannot have head covering.  R: Pt is receptive. No complaints of pain or discomfort at this time. Q15 min safety checks maintained. Pt remains safe on the unit. Will continue to monitor.

## 2014-02-21 NOTE — [facility] Group Notes (Signed)
[facility] Group Notes:  (Nursing/MHT/Case Management/Adjunct)  Date:  02/21/2014  Time: 0900 am  Type of Therapy:  Psychoeducational Skills  Participation Level:  Active  Participation Quality:  Appropriate and Attentive  Affect:  Blunted  Cognitive:  Alert  Insight:  Appropriate  Engagement in Group:  Developing/Improving  Modes of Intervention:  Support  Summary of Progress/Problems:  Makayla Medina, Makayla Medina 02/21/2014, 2:46 PM

## 2014-02-22 NOTE — Progress Notes (Signed)
D: Pt asleep. Resp are even and unlabored. No distress noted.  A: 15 minute checks performed for safety.  R: Pt safety maintained.   

## 2014-02-22 NOTE — Progress Notes (Signed)
Patient ID: [name], female   DOB: [birth year], 28 y.o.   MRN: [id number] [facility] MD Progress Note  02/22/2014 1:57 PM Shane Melby  MRN:  [id number] Subjective: Patient states "I am feeling better on my new medication. I like it much better than Risperdal because I gained so much weight on that medication. I can tell that on Latuda I am acting less aggressive. I still think my mother was wrong. I pay my rent. What is wrong with me taking frequent showers and praying to my God? My mother is just ignorant. She does not realize that Allah is just the Arabic word for God."   Objective: Patient seen and chart is reviewed. She endorses decreased delusions, paranoia and auditory hallucinations. She remains religiously preoccupied, but is less easily agitated and labile. However, she is able to express herself more clearly today. Jalonda was able to calmly talk to Clinical research associate about her religious beliefs. Patient was observed to no longer be covering her head and makes good eye contact. She appears to be responding well to Jordan. Patient continues to defend her religious beliefs. She reports that her mother agitates her by making condescending remarks about her religious practices.   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 Anxiety   . Bipolar disorder   . Vaginal Pap smear, abnormal   . Gonorrhea   . Chlamydia    Axis IV: other psychosocial or environmental problems and problems related to social environment  ADL's:  Intact  Sleep: Good  Appetite:  Fair  Suicidal  Ideation: denies  Homicidal Ideation:  denies AEB (as evidenced by):  Psychiatric Specialty Exam: Physical Exam  Psychiatric: Her affect is labile. She is actively hallucinating. Cognition and memory are normal. She expresses impulsivity.    Review of Systems  Constitutional: Negative.   HENT: Negative.   Eyes: Negative.   Respiratory: Negative.   Cardiovascular: Negative.   Gastrointestinal: Negative.   Genitourinary: Negative.   Musculoskeletal: Negative.   Skin: Negative.   Neurological: Negative.   Endo/Heme/Allergies: Negative.   Psychiatric/Behavioral: Positive for hallucinations and substance abuse. Negative for depression, suicidal ideas and memory loss. The patient is nervous/anxious. The patient does not have insomnia.     Blood pressure 114/76, pulse 64, temperature 97.6 F (36.4 C), temperature source Oral, resp. rate 16, height 5' 5.5" (1.664 m), weight 79.833 kg (176 lb), last menstrual period 01/19/2014, unknown if currently breastfeeding.Body mass index is 28.83 kg/(m^2).  General Appearance: Casual  Eye Contact::  Good  Speech:  Pressured  Volume:  Normal  Mood:  Anxious and Irritable  Affect:  Labile and Full Range  Thought Process:  Circumstantial and Disorganized  Orientation:  Full (Time, Place, and Person)  Thought Content:  Delusions and Hallucinations: Auditory  Suicidal Thoughts:  No  Homicidal Thoughts:  No  Memory:  Immediate;   Fair Recent;   Fair Remote;   Fair  Judgement:  Impaired  Insight:  Shallow  Psychomotor Activity:  Increased  Concentration:  Fair  Recall:  Fair  Fund of Knowledge:Good  Language: Good  Akathisia:  No  Handed:  Right  AIMS (if indicated):     Assets:  Communication Skills Desire for Improvement Physical Health  Sleep:  Number of Hours: 6.5   Musculoskeletal: Strength & Muscle Tone: within normal limits Gait & Station: normal Patient leans: N/A  Current Medications: Current Facility-Administered  Medications  Medication Dose Route Frequency Provider Last Rate Last Dose  . benztropine (COGENTIN) tablet 0.5 mg  0.5 mg Oral QPC supper Bedie Dominey   0.5 mg at 02/21/14 1709  . LORazepam (ATIVAN) tablet 1 mg  1 mg Oral Q6H PRN Lashante Fryberger   1 mg at 02/22/14 0738  . lurasidone (LATUDA) tablet 40 mg  40 mg Oral QPC supper Filomeno Cromley   40 mg at 02/21/14 1709  . magnesium hydroxide (MILK OF MAGNESIA) suspension 30 mL  30 mL Oral Daily PRN Shuvon Rankin, NP      . nicotine (NICODERM CQ - dosed in mg/24 hours) patch 21 mg  21 mg Transdermal Daily Shuvon Rankin, NP      . OLANZapine zydis (ZYPREXA) disintegrating tablet 10 mg  10 mg Oral Q8H PRN Mylz Yuan   10 mg at 02/22/14 0738  . traZODone (DESYREL) tablet 100 mg  100 mg Oral QHS Falesha Schommer   100 mg at 02/21/14 2141    Lab Results: No results found for this or any previous visit (from the past 48 hour(s)).  Physical Findings: AIMS: Facial and Oral Movements Muscles of Facial Expression: None, normal Lips and Perioral Area: None, normal Jaw: None, normal Tongue: None, normal,Extremity Movements Upper (arms, wrists, hands, fingers): None, normal Lower (legs, knees, ankles, toes): None, normal, Trunk Movements Neck, shoulders, hips: None, normal, Overall Severity Severity of abnormal movements (highest score from questions above): None, normal Incapacitation due to abnormal movements: None, normal Patient's awareness of abnormal movements (rate only patient's report): No Awareness, Dental Status Current problems with teeth and/or dentures?: No Does patient usually wear dentures?: No  CIWA:    COWS:     Treatment Plan Summary: Daily contact with patient to assess and evaluate symptoms and progress in treatment Medication management  Plan:1. Continue crisis management and stabilization. 2. Medication management to reduce current symptoms to base line and improve the patient's overall level of  functioning: -Continue Latuda 40mg  daily after supper for mood/delusions/psychosis -Cogentin 0.5mg  daily after supper for EPS prevention -Continue Trazodone 100mg  qhs as needed for insomnia 3. Treat health problems as indicated. 4. Develop treatment plan to decrease risk of relapse upon discharge and the need for readmission. 5. Psycho-social education regarding relapse prevention and self care. 6. Health care follow up as needed for medical problems. 7. Restart home medications where appropriate.  Medical Decision Making Problem Points:  Established problem, stable/improving (1), Review of last therapy session (1) and Review of psycho-social stressors (1) Data Points:  Order Aims Assessment (2) Review or order clinical lab tests (1) Review of medication regiment & side effects (2) Review of new medications or change in dosage (2)  I certify that inpatient services furnished can reasonably be expected to improve the patient's condition.   Fransisca KaufmannDAVIS, LAURA, NP-C 02/22/2014, 1:57 PM  Patient seen, evaluated and I agree with notes by Nurse Practitioner. Thedore MinsMojeed Analayah Brooke, MD

## 2014-02-22 NOTE — [facility] Group Notes (Signed)
[facility]   02/22/2014 10:08 AM  Participation Quality:  Engaged  Mood/Affect:  Appropriate  Depression Rating:  denies  Anxiety Rating:  denies  Thoughts of Suicide:  No Will you contract for safety?   NA  Current AVH:  No  Plan for Discharge/Comments:  Charlene's mood is much more even today.  She does not demonstrate religious pre-occupation today.  Her mood is good.  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  Transportation Means: mother  Supports: mother  Ida Rogueorth, Harace Mccluney B

## 2014-02-22 NOTE — Progress Notes (Signed)
Patient ID: Makayla Medina, female   DOB: 1986/10/30, 28 y.o.   MRN: [id number] 02-22-2014 @ 1456 nursing shift note: D: pt came to the medication window this am and asked for something for agitation and anxiety. Pt denied any si/hi/av. A: pt was administered Zyprexa and ativan for agitation and anxiety. The medications decreased her agitation and anxiety. R: on her inventory sheet she wrote: slept well, appetite good, energy normal, attention good with her depression at 2 and hopelessness at 1. No w/d symptoms. After discharge she plans to " listen to my mom more, plans to continue to pray daily". RN will continue to monitor and Q 15 min ck's continue.

## 2014-02-22 NOTE — [facility] Suicide Risk Assessment (Signed)
[facility] INPATIENT:  Family/Significant Other Suicide Prevention Education  Suicide Prevention Education:  Education Completed; No one has been identified by the patient as the family member/significant other with whom the patient will be residing, and identified as the person(s) who will aid the patient in the event of a mental health crisis (suicidal ideations/suicide attempt).  With written consent from the patient, the family member/significant other has been provided the following suicide prevention education, prior to the and/or following the discharge of the patient.  The suicide prevention education provided includes the following:  Suicide risk factors  Suicide prevention and interventions  National Suicide Hotline telephone number  [facility] assessment telephone number  [facility] Emergency Assistance 911  [facility] and/or Residential Mobile Crisis Unit telephone number  Request made of family/significant other to:  Remove weapons (e.g., guns, rifles, knives), all items previously/currently identified as safety concern.    Remove drugs/medications (over-the-counter, prescriptions, illicit drugs), all items previously/currently identified as a safety concern.  The family member/significant other verbalizes understanding of the suicide prevention education information provided.  The family member/significant other agrees to remove the items of safety concern listed above.  The patient did not endorse SI at the time of admission, nor did the patient c/o SI during the stay here.  SPE not required.   Daryel Geraldorth, Ellie Bryand B 02/22/2014, 2:42 PM

## 2014-02-22 NOTE — Progress Notes (Signed)
Patient ID: Makayla Medina, female   DOB: May 14, 1986, 28 y.o.   MRN: [id number] D. The patient has a sad mood and flat affect. Stated that she is trying to be more patient with her mother and wishes that her mother could understand that there is more then the [place] way to worship Allah or God. Stated that she is here in the hospital because her mother can't accept that. A. Met with patient to assess. Discussed recovery discharge plan. Encouraged to attend evening wrap up group. Administered HS medication. R. The patient stated that she felt her new medication is helping and that she should be ready for discharge by Monday. She plans on following up at [facility] and with her counselor at [employer] after discharge. Attended and actively participated in evening group. Compliant with medication.

## 2014-02-22 NOTE — B[facility] Group Notes (Signed)
Adult Psychoeducational Group Note  Date:  02/22/2014 Time:  8:57 PM  Group Topic/Focus:  Wrap-Up Group:   The focus of this group is to help patients review their daily goal of treatment and discuss progress on daily workbooks.  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  She also [facility].  She expressed being more patient with her mother regarding religion.  [name], Mataya Kilduff A 02/22/2014, 8:57 PM

## 2014-02-23 NOTE — Progress Notes (Signed)
Patient ID: Makayla Medina, female   DOB: [birth year], 28 y.o.   MRN: [id number] Psychoeducational Group Note  Date:  02/23/2014 Time:0930am  Group Topic/Focus:  Identifying Needs:   The focus of this group is to help patients identify their personal needs that have been historically problematic and identify healthy behaviors to address their needs.  Participation Level:  Active  Participation Quality:  Appropriate  Affect:  Appropriate  Cognitive:  Disorganized  Insight:  Supportive  Engagement in Group:  Supportive  Additional Comments:  Healthy coping skills  Valente DavidWeaver, Israel Wunder Brooks 02/23/2014,12:48 PM

## 2014-02-23 NOTE — Progress Notes (Signed)
Patient ID: Makayla Medina, female   DOB: January 18, 1986, 28 y.o.   MRN: [id number] [facility]  02/23/2014 1:30 PM Makayla Medina  MRN:  [id number] Subjective: Patient states "I am feeling better on the Latuda. I slept well and I'm eating OK. I'm not having any side effects."  Objective: Patient seen and chart is reviewed. She endorses decreased delusions, paranoia and auditory hallucinations. Pt is very calm, stating that she and her mother have had a disagreement but that things at home are going to be OK. She denies SI,  HI, and AVH. She appears to be responding well to JordanLatuda. .   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 Anxiety   . Bipolar disorder   . Vaginal Pap smear, abnormal   . Gonorrhea   . Chlamydia    Axis IV: other psychosocial or environmental problems and problems related to social environment  ADL's:  Intact  Sleep: Good  Appetite:  Fair  Suicidal Ideation: denies  Homicidal Ideation:  denies AEB (as evidenced by):  Psychiatric Specialty Exam: Physical Exam  Psychiatric: Her affect is labile. She is actively hallucinating. Cognition and memory are normal. She expresses impulsivity.    Review of Systems  Constitutional: Negative.   HENT: Negative.   Eyes: Negative.   Respiratory: Negative.   Cardiovascular: Negative.   Gastrointestinal: Negative.   Genitourinary: Negative.   Musculoskeletal: Negative.   Skin: Negative.   Neurological: Negative.   Endo/Heme/Allergies: Negative.   Psychiatric/Behavioral: Positive  for substance abuse. Negative for depression, suicidal ideas, hallucinations and memory loss. The patient is nervous/anxious. The patient does not have insomnia.     Blood pressure 107/71, pulse 67, temperature 98 F (36.7 C), temperature source Oral, resp. rate 16, height 5' 5.5" (1.664 m), weight 79.833 kg (176 lb), last menstrual period 01/19/2014, unknown if currently breastfeeding.Body mass index is 28.83 kg/(m^2).  General Appearance: Casual  Eye Contact::  Good  Speech:  Clear and Coherent  Volume:  Normal  Mood:  Euthymic  Affect:  Appropriate  Thought Process:  Coherent  Orientation:  Full (Time, Place, and Person)  Thought Content:  WDL  Suicidal Thoughts:  No  Homicidal Thoughts:  No  Memory:  Immediate;   Fair Recent;   Fair Remote;   Fair  Judgement:  Impaired  Insight:  Shallow  Psychomotor Activity:  Normal  Concentration:  Fair  Recall:  [employer]  Fund of Knowledge:Good  Language: Good  Akathisia:  No  Handed:  Right  AIMS (if indicated):     Assets:  Communication Skills Desire for Improvement Physical Health  Sleep:  Number of Hours: 6.75   Musculoskeletal: Strength & Muscle Tone: within normal limits Gait & Station: normal Patient leans: N/A  Current Medications: Current Facility-Administered Medications  Medication Dose Route Frequency Provider Last Rate Last Dose  . benztropine (COGENTIN) tablet 0.5 mg  0.5 mg Oral QPC supper Mojeed Akintayo   0.5 mg at 02/22/14 1718  . LORazepam (ATIVAN) tablet 1 mg  1 mg Oral Q6H PRN Mojeed Akintayo   1 mg at 02/22/14 0738  .  lurasidone (LATUDA) tablet 40 mg  40 mg Oral QPC supper Mojeed Akintayo   40 mg at 02/22/14 1718  . magnesium hydroxide (MILK OF MAGNESIA) suspension 30 mL  30 mL Oral Daily PRN Shuvon Rankin, NP      . nicotine (NICODERM CQ - dosed in mg/24 hours) patch 21 mg  21 mg Transdermal Daily Shuvon Rankin, NP      . OLANZapine zydis (ZYPREXA) disintegrating tablet 10 mg  10 mg Oral Q8H PRN Mojeed  Akintayo   10 mg at 02/23/14 0735  . traZODone (DESYREL) tablet 100 mg  100 mg Oral QHS Mojeed Akintayo   100 mg at 02/22/14 2145    Lab Results: No results found for this or any previous visit (from the past 48 hour(s)).  Physical Findings: AIMS: Facial and Oral Movements Muscles of Facial Expression: None, normal Lips and Perioral Area: None, normal Jaw: None, normal Tongue: None, normal,Extremity Movements Upper (arms, wrists, hands, fingers): None, normal Lower (legs, knees, ankles, toes): None, normal, Trunk Movements Neck, shoulders, hips: None, normal, Overall Severity Severity of abnormal movements (highest score from questions above): None, normal Incapacitation due to abnormal movements: None, normal Patient's awareness of abnormal movements (rate only patient's report): No Awareness, Dental Status Current problems with teeth and/or dentures?: No Does patient usually wear dentures?: No  CIWA:    COWS:     Treatment Plan Summary: Daily contact with patient to assess and evaluate symptoms and progress in treatment Medication management  Plan:1. Continue crisis management and stabilization. 2. Medication management to reduce current symptoms to base line and improve the patient's overall level of functioning: -Continue Latuda 40mg  daily after supper for mood/delusions/psychosis -Cogentin 0.5mg  daily after supper for EPS prevention -Continue Trazodone 100mg  qhs as needed for insomnia 3. Treat health problems as indicated. 4. Develop treatment plan to decrease risk of relapse upon discharge and the need for readmission. 5. Psycho-social education regarding relapse prevention and self care. 6. Health care follow up as needed for medical problems. 7. Restart home medications where appropriate.  Medical Decision Making Problem Points:  Established problem, stable/improving (1), Review of last therapy session (1) and Review of psycho-social stressors (1) Data Points:  Order  Aims Assessment (2) Review or order clinical lab tests (1) Review of medication regiment & side effects (2) Review of new medications or change in dosage (2)  I certify that inpatient services furnished can reasonably be expected to improve the patient's condition.   Beau Fanny, FNP-BC 02/23/2014, 1:30 PM Agree with assessment and plan Madie Reno A. Dub Mikes, M.D.

## 2014-02-23 NOTE — Progress Notes (Signed)
The focus of this group is to help patients review their daily goal of treatment and discuss progress on daily workbooks. Pt attended the evening group session and responded to all discussion prompts from the Writer. Pt shared that today was a good day on the unit, the highlight of which was a visit from her boyfriend. Pt shared that her only need for the evening was to find out which countries had membership in the [employer]. Pt shared that her goal upon discharge was to volunteer for an agency that helped immigrants from [place]. Pt's affect was appropriate.

## 2014-02-23 NOTE — Progress Notes (Signed)
Patient ID: Makayla Medina, female   DOB: June 22, 1986, 28 y.o.   MRN: [id number] Psychoeducational Group Note  Date:  02/23/2014 Time:0910am  Group Topic/Focus:  Identifying Needs:   The focus of this group is to help patients identify their personal needs that have been historically problematic and identify healthy behaviors to address their needs.  Participation Level:  Active  Participation Quality:  Appropriate  Affect:  Appropriate  Cognitive:  Disorganized  Insight:  Supportive  Engagement in Group:  Supportive  Additional Comments:  Inventory group   Valente DavidWeaver, Makayla Medina Brooks 02/23/2014,12:46 PM

## 2014-02-23 NOTE — [facility] Group Notes (Signed)
[facility] LCSW Group Therapy  02/23/2014 1:04 PM  Type of Therapy:  Group Therapy  Participation Level:  Active  Participation Quality:  Appropriate, Attentive, Sharing and Supportive  Affect:  Appropriate and Irritable  Cognitive:  Alert, Appropriate and Oriented  Insight:  Developing/Improving  Engagement in Therapy:  Engaged and Supportive  Modes of Intervention:  Discussion, Education, Exploration, Rapport Building, Socialization and Support  Summary of Progress/Problems: Pt was engaged and was able to identify negative/psotive supports, as well as an affective coping skill.  Pt would present irritable at times during role call and discussion on coping skill, otherwise pt had great participation.   Makayla Medina, Makayla Medina 02/23/2014, 1:04 PM

## 2014-02-23 NOTE — Progress Notes (Signed)
Patient ID: Makayla Medina, female   DOB: 11-Jul-1986, 28 y.o.   MRN: [id number] D. The patient had a depressed mood and flat affect this evening. Stated that she is depressed because she needs to be discharged and she is still here. Reported that her boyfriend visited her today, but she has not been able to reach him by phone, which was adding to her feeling low this evening wondering what he was doing. Remained focused on getting information regarding the countries that participated in the [employer]. Explained this further by stating she was going to be the C.E.O. of a [employer] organization to help refugees and immigrants from [place] countries that come to the [place]. A. Encouraged the patient to come to group. Met with patient to assess. Reviewed and administered HS medication. R. The patient attended and participated in evening group. Denied a/v hallucinations. Compliant with medication.

## 2014-02-24 NOTE — Progress Notes (Signed)
Patient ID: Marijo FileBreana Guinther, female   DOB: 04/27/1986, 28 y.o.   MRN: [id number] Psychoeducational Group Note  Date:  02/24/2014 Time:  0910am  Group Topic/Focus:  Making Healthy Choices:   The focus of this group is to help patients identify negative/unhealthy choices they were using prior to admission and identify positive/healthier coping strategies to replace them upon discharge.  Participation Level:  Active  Participation Quality:  angry   Affect:  Anxious and Irritable  Cognitive:  Disorganized  Insight:  Resistant  Engagement in Group:  Resistant  Additional Comments:  Inventory group   Valente DavidWeaver, Eather Chaires Brooks 02/24/2014,10:25 AM

## 2014-02-24 NOTE — Progress Notes (Signed)
Patient ID: Makayla Medina, female   DOB: 1986/05/09, 28 y.o.   MR[facility]: [id number] [facility]  02/24/2014 12:39 PM Makayla Medina  MRN:  [id number] Subjective: Patient states "I'm doing great, sir. I feel like I'm a whole lot better. My medicine is definitely right. It's helping a lot, sir."  Objective: Patient seen and chart is reviewed. She denies delusions, paranoia and auditory/visual hallucinations. Pt is very calm, stating that she probably did not need to come here to begin with but that she understands everyone's position and opinion and that she feels she still benefited from the medications as well as the group therapy. She denies SI, HI.  Pt asks if she is able to go home on Monday; informed pt that Dr. Jannifer FranklinAkintayo will consult with her in the morning.    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 Anxiety   . Bipolar disorder   . Vaginal Pap smear, abnormal   . Gonorrhea   . Chlamydia    Axis IV: other psychosocial or environmental problems and problems related to social environment  ADL's:  Intact  Sleep: Good  Appetite:  Fair  Suicidal Ideation: denies  Homicidal Ideation:  denies AEB (as evidenced by):  Psychiatric Specialty Exam: Physical Exam  Psychiatric: Her affect is not labile. She is not actively hallucinating. Cognition and memory are normal. She does not express impulsivity.    Review of Systems  Constitutional: Negative.   HENT: Negative.   Eyes: Negative.   Respiratory: Negative.   Cardiovascular: Negative.    Gastrointestinal: Negative.   Genitourinary: Negative.   Musculoskeletal: Negative.   Skin: Negative.   Neurological: Negative.   Endo/Heme/Allergies: Negative.   Psychiatric/Behavioral: Positive for depression and substance abuse. Negative for suicidal ideas, hallucinations and memory loss. The patient is nervous/anxious. The patient does not have insomnia.     Blood pressure 115/65, pulse 76, temperature 97.9 F (36.6 C), temperature source Oral, resp. rate 18, height 5' 5.5" (1.664 m), weight 79.833 kg (176 lb), last menstrual period 01/19/2014, unknown if currently breastfeeding.Body mass index is 28.83 kg/(m^2).  General Appearance: Casual  Eye Contact::  Good  Speech:  Clear and Coherent  Volume:  Normal  Mood:  Euthymic  Affect:  Appropriate  Thought Process:  Coherent  Orientation:  Full (Time, Place, and Person)  Thought Content:  WDL  Suicidal Thoughts:  No  Homicidal Thoughts:  No  Memory:  Immediate;   Fair Recent;   Fair Remote;   Fair  Judgement:  Fair  Insight:  Fair  Psychomotor Activity:  Normal  Concentration:  Fair  Recall:  [employer]Fair  Fund of Knowledge:Good  Language: Good  Akathisia:  No  Handed:  Right  AIMS (if indicated):     Assets:  Communication Skills Desire for Improvement Physical Health  Sleep:  Number of Hours: 6.25   Musculoskeletal: Strength & Muscle Tone: within normal limits Gait & Station: normal Patient leans: N/A  Current Medications: Current Facility-Administered Medications  Medication Dose Route Frequency Provider Last Rate Last Dose  . benztropine (COGENTIN) tablet 0.5 mg  0.5  mg Oral QPC supper Mojeed Akintayo   0.5 mg at 02/23/14 1654  . LORazepam (ATIVAN) tablet 1 mg  1 mg Oral Q6H PRN Mojeed Akintayo   1 mg at 02/24/14 0757  . lurasidone (LATUDA) tablet 40 mg  40 mg Oral QPC supper Mojeed Akintayo   40 mg at 02/23/14 1654  . magnesium hydroxide (MILK OF MAGNESIA) suspension 30 mL  30 mL Oral Daily PRN Shuvon Rankin, NP       . nicotine (NICODERM CQ - dosed in mg/24 hours) patch 21 mg  21 mg Transdermal Daily Shuvon Rankin, NP      . OLANZapine zydis (ZYPREXA) disintegrating tablet 10 mg  10 mg Oral Q8H PRN Mojeed Akintayo   10 mg at 02/24/14 0757  . traZODone (DESYREL) tablet 100 mg  100 mg Oral QHS Mojeed Akintayo   100 mg at 02/23/14 2136    Lab Results: No results found for this or any previous visit (from the past 48 hour(s)).  Physical Findings: AIMS: Facial and Oral Movements Muscles of Facial Expression: None, normal Lips and Perioral Area: None, normal Jaw: None, normal Tongue: None, normal,Extremity Movements Upper (arms, wrists, hands, fingers): None, normal Lower (legs, knees, ankles, toes): None, normal, Trunk Movements Neck, shoulders, hips: None, normal, Overall Severity Severity of abnormal movements (highest score from questions above): None, normal Incapacitation due to abnormal movements: None, normal Patient's awareness of abnormal movements (rate only patient's report): No Awareness, Dental Status Current problems with teeth and/or dentures?: No Does patient usually wear dentures?: No  CIWA:    COWS:     Treatment Plan Summary: Daily contact with patient to assess and evaluate symptoms and progress in treatment Medication management  Plan:1. Continue crisis management and stabilization. 2. Medication management to reduce current symptoms to base line and improve the patient's overall level of functioning:  -Continue Latuda 40mg  daily after supper for mood/delusions/psychosis -Cogentin 0.5mg  daily after supper for EPS prevention -Continue Trazodone 100mg  qhs as needed for insomnia  3. Treat health problems as indicated. 4. Develop treatment plan to decrease risk of relapse upon discharge and the need for readmission. 5. Psycho-social education regarding relapse prevention and self care. 6. Health care follow up as needed for medical problems. 7. Restart home medications where  appropriate.  Medical Decision Making Problem Points:  Established problem, stable/improving (1), Review of last therapy session (1) and Review of psycho-social stressors (1) Data Points:  Order Aims Assessment (2) Review or order clinical lab tests (1) Review of medication regiment & side effects (2) Review of new medications or change in dosage (2)  I certify that inpatient services furnished can reasonably be expected to improve the patient's condition.   Beau Fanny, FNP-BC 02/24/2014, 12:39 PM Agree with assessment and plan Rivka Barbara, M.D.

## 2014-02-24 NOTE — Progress Notes (Signed)
Adult Psychoeducational Group Note  Date:  02/24/2014 Time:  9:36 PM  Group Topic/Focus:  Wrap-Up Group:   The focus of this group is to help patients review their daily goal of treatment and discuss progress on daily workbooks.  Participation Level:  Active  Participation Quality:  Appropriate  Affect:  Appropriate  Cognitive:  Appropriate  Insight: Appropriate  Engagement in Group:  Engaged  Modes of Intervention:  Discussion  Additional Comments:  The patient expressed that understanding your triggers and finding a way to cope.The patient did not express any coping skills.  Octavio Mannshigpen, Aleeta Schmaltz Lee 02/24/2014, 9:36 PM

## 2014-02-24 NOTE — Progress Notes (Signed)
Patient ID: Makayla Medina, female   DOB: 1986/04/25, 28 y.o.   MRN: [id number] D. The patient had a depressed mood and affect. She was visible in the milieu, but kept to herself this evening. Somewhat guarded and forwarded very little this evening when staff attempted to engage in conversation with her.Very kind and caring towards her roommate who is intellectually limited. Stated that she uses dance to relief stress and as a coping skilled. Hoping to be discharged tomorrow. Will follow up at [facility]. A. Met with patient to assess. Encouraged to attend evening group. Offered verbal support. Administered HS medication. R. The patient denies suicidal/homicidal ideation. Denies a/v hallucinations. Attended with minimal participation in evening group. Compliant with medication.

## 2014-02-24 NOTE — Progress Notes (Signed)
Patient ID: Makayla Medina, female   DOB: May 03, 1986, 28 y.o.   MRN: [id number] Psychoeducational Group Note  Date:  02/24/2014 Time:  0930am  Group Topic/Focus:  Making Healthy Choices:   The focus of this group is to help patients identify negative/unhealthy choices they were using prior to admission and identify positive/healthier coping strategies to replace them upon discharge.  Participation Level:  Active  Participation Quality:  Resistant  Affect:  Irritable  Cognitive:  Disorganized  Insight:  Resistant  Engagement in Group:  Resistant  Additional Comments:  Healthy support group   Valente DavidWeaver, Makayla Medina 02/24/2014,10:26 AM

## 2014-02-24 NOTE — Progress Notes (Signed)
Patient ID: Marijo FileBreana Fehringer, female   DO: 1986-07-06, 28 y.o.   MRM: [id number] 02-24-2014 nursing shift note: D: pt came to the medication window and was preoccupied with "global communication". This notion came out of nowhere, but the RN wanted to explore her thought process. A: when asked she stated she was taking a class in school called "global communication". She was anxious and agitated further in the conversation,  so the RN administered her a ativan and a Zyprexa. R: she denied any is/hi, but may still be moderately delusional. On her inventory sheet she wrote: slept well, appetite good, energy normal, attention good with her depression and hopelessness both at 1. No pain and no aw/d symptoms. After discharge she plans to "stay on my med's and work on my relationship with my mom". RN will monitor and Q 15 min ck's continue.

## 2014-02-25 NOTE — [facility] Group Notes (Signed)
[facility] LCSW Group Therapy  02/25/2014 2:30 PM  Type of Therapy:  Group Therapy  Participation Level:  Active  Participation Quality:  Appropriate and Sharing  Affect:  Blunted  Cognitive:  Alert and Oriented  Insight:  Developing/Improving  Engagement in Therapy:  Engaged  Modes of Intervention:  Discussion, Exploration and Problem-solving  Summary of Progress/Problems:  In this group patients will be encouraged to explore what they see as obstacles to their own wellness and recovery. They will be guided to discuss their thoughts, feelings, and behaviors related to these obstacles. The group will process together ways to cope with barriers, with attention given to specific choices patients can make. Each patient will be challenged to identify changes they are motivated to make in order to overcome their obstacles.  [name] was direct and blunted in group with comments and affect. She engaged in the topic of obstacles reporting being in the hospital has been an obstacle because she will have to take time off of school before returning to make up all the work. She is able to show insight and self awareness of the stress of missing so much school will cause her to relapse and overanalyze her problems and issues.  She uses prayer as means of coping with obstacles.  Raye SorrowCoble, Buelah Rennie N 02/25/2014, 2:30 PM

## 2014-02-25 NOTE — Progress Notes (Signed)
Adult Psychoeducational Group Note  Date:  02/25/2014 Time:  10:29 PM  Group Topic/Focus:  Wrap-Up Group:   The focus of this group is to help patients review their daily goal of treatment and discuss progress on daily workbooks.  Participation Level:  Active  Participation Quality:  Appropriate  Affect:  Appropriate  Cognitive:  Appropriate  Insight: Improving  Engagement in Group:  Engaged  Modes of Intervention:  Education, Exploration, Socialization and Support  Additional Comments:  Patient attended and participated in group tonight. She reports that today she started out being a little upset because she thought she would get to go home. As the day went on she felt better. Today she pray, talk and socialized with peers. The patient advised that when she is well she is more social  Scot DockFrancis, Nyla Creason Dacosta 02/25/2014, 10:29 PM

## 2014-02-25 NOTE — Progress Notes (Signed)
D: Pt became upset today during group.  She was hoping to be discharged today and became irate with counselor when she realized she was not leaving. Pt spoke with her doctor.  She was tearful but understanding as to why she needed to stay.  A: Support/encouragement given.  R: Pt. Remains safe. Denies SI/HI.

## 2014-02-25 NOTE — Progress Notes (Signed)
D: Patient in the hallway on approach.  Patient states she had a good day.  Patient appears bright but anxious about discharge.  Patient states she is ready for discharge.  Patient states she is going to continue to take her medications.  Patient denies SI/HI and denies AVH. A: Staff to monitor Q 15 mins for safety.  Encouragement and support offered.  Scheduled medications administered per orders. R: Patient remains safe on the unit.  Patient attended group tonight.  Patient visible on the unit and interacting with peers.  Patient taking administered medications.

## 2014-02-25 NOTE — Progress Notes (Signed)
Patient ID: Makayla Medina, female   DOB: [birth year], 28 y.o.   MRN: [id number] 1:1 Nursing Note: The patient is resting in bed with eyes closed. No distress noted. 1:1 maintained for safety. Will continue to monitor.

## 2014-02-25 NOTE — [facility] Group Notes (Signed)
[facility]   02/25/2014 9:56 AM  Participation Quality:  Active  Mood/Affect:  Blunted and Tearful  Depression Rating:  0  Anxiety Rating:  0  Thoughts of Suicide:  No Will you contract for safety?   Yes  Current AVH:  No  Plan for Discharge/Comments:  Makayla PickleBreana was very focused on DC today and upset when she was told it will be on Tuesday. She reports she feels like she has been doing everything right, but this is a punishment.  Reports she will follow up back at her [facility]: [employer] and also needs [facility] for [facility]  Transportation Means: family  Supports: family and friends.  Makayla Medina, Makayla Medina N

## 2014-02-25 NOTE — Progress Notes (Signed)
Adult Psychoeducational Group Note  Date:  02/25/2014 Time:  10:24 AM  Group Topic/Focus:  Wellness Toolbox:   The focus of this group is to discuss various aspects of wellness, balancing those aspects and exploring ways to increase the ability to experience wellness.  Patients will create a wellness toolbox for use upon discharge.  Participation Level:  Minimal  Participation Quality:  Appropriate, Attentive and Sharing  Affect:  Appropriate  Cognitive:  Alert and Appropriate  Insight: Limited  Engagement in Group:  Limited  Modes of Intervention:  Education  Additional Comments:  Pt shared a little about personal wellness and how she enjoys walks in nature and dancing.  Pt then asked about the weather and if we would be going outside today.  Berlin Hunuttle, Alireza Pollack M 02/25/2014, 10:24 AM

## 2014-02-25 NOTE — Progress Notes (Signed)
Patient ID: Makayla Medina, female   DOB: [birth year], 28 y.o.   MR[facility]: [id number] [facility]  02/25/2014 10:34 AM Makayla FileBreana Medina  MRN:  [id number] Subjective: " I have been doing well on my medications. I think I have been doing everything right so that I can get discharge and go back to school.'' Objective: Patient seen and chart is reviewed. She reports that her current medication regimen have been very helpful, she denies any adverse reactions. Patient endorsed endorsed decreased mood swings, agitation, delusions or  Paranoia However, she remains religiously preoccupied, her thought process has been more organized.  She denies suicidal ideation, intent or plan. Patient is compliant with her medications and has not endorsed any adverse reactions. 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 Anxiety   . Bipolar disorder   . Vaginal Pap smear, abnormal   . Gonorrhea   . Chlamydia    Axis IV: other psychosocial or environmental problems and problems related to social environment  ADL's:  Intact  Sleep: Good  Appetite:  Fair  Suicidal Ideation: denies  Homicidal Ideation:  denies AEB (as evidenced by):  Psychiatric Specialty Exam: Physical Exam  Psychiatric: Her speech is normal. Her mood appears anxious. Thought content is paranoid. Cognition and memory are normal. She expresses impulsivity.    Review of Systems  Constitutional: Negative.   HENT: Negative.   Eyes: Negative.   Respiratory: Negative.   Cardiovascular: Negative.    Gastrointestinal: Negative.   Genitourinary: Negative.   Musculoskeletal: Negative.   Skin: Negative.   Neurological: Negative.   Endo/Heme/Allergies: Negative.   Psychiatric/Behavioral: The patient is nervous/anxious.     Blood pressure 112/75, pulse 108, temperature 97.1 F (36.2 C), temperature source Oral, resp. rate 16, height 5' 5.5" (1.664 m), weight 79.833 kg (176 lb), last menstrual period 01/19/2014, unknown if currently breastfeeding.Body mass index is 28.83 kg/(m^2).  General Appearance: Fairly Groomed  Patent attorneyye Contact::  Minimal  Speech:  Normal  Volume:  Normal  Mood:  Irritable  Affect:  Labile  Thought Process:  Circumstantial   Orientation:  Full (Time, Place, and Person)  Thought Content:  Delusions and Hallucinations  Suicidal Thoughts:  No  Homicidal Thoughts:  No  Memory:  Immediate;   Fair Recent;   Fair Remote;   Fair  Judgement:  Impaired  Insight:  Shallow  Psychomotor Activity:  Increased  Concentration:  Fair  Recall:  FiservFair  Fund of Knowledge:Good  Language: Good  Akathisia:  No  Handed:  Right  AIMS (if indicated):     Assets:  Communication Skills Desire for Improvement Physical Health  Sleep:  Number of Hours: 6.75   Musculoskeletal: Strength & Muscle Tone: within normal limits Gait & Station: normal Patient leans: N/A  Current Medications: Current Facility-Administered Medications  Medication Dose Route Frequency Provider Last Rate Last Dose  . benztropine (COGENTIN) tablet 0.5 mg  0.5 mg Oral QPC supper Charlotta Lapaglia   0.5 mg at 02/24/14 1625  . LORazepam (ATIVAN) tablet 1 mg  1 mg Oral Q6H PRN Spero Gunnels   1 mg at 02/24/14  0757  . lurasidone (LATUDA) tablet 40 mg  40 mg Oral QPC supper Ronniesha Seibold   40 mg at 02/24/14 1625  . magnesium hydroxide (MILK OF MAGNESIA) suspension 30 mL  30 mL Oral Daily PRN Shuvon Rankin, NP      . nicotine (NICODERM CQ - dosed in mg/24 hours) patch 21 mg  21 mg Transdermal Daily Shuvon Rankin, NP       . OLANZapine zydis (ZYPREXA) disintegrating tablet 10 mg  10 mg Oral Q8H PRN Saarah Dewing   10 mg at 02/24/14 0757  . traZODone (DESYREL) tablet 100 mg  100 mg Oral QHS Vella Colquitt   100 mg at 02/24/14 2137    Lab Results: No results found for this or any previous visit (from the past 48 hour(s)).  Physical Findings: AIMS: Facial and Oral Movements Muscles of Facial Expression: None, normal Lips and Perioral Area: None, normal Jaw: None, normal Tongue: None, normal,Extremity Movements Upper (arms, wrists, hands, fingers): None, normal Lower (legs, knees, ankles, toes): None, normal, Trunk Movements Neck, shoulders, hips: None, normal, Overall Severity Severity of abnormal movements (highest score from questions above): None, normal Incapacitation due to abnormal movements: None, normal Patient's awareness of abnormal movements (rate only patient's report): No Awareness, Dental Status Current problems with teeth and/or dentures?: No Does patient usually wear dentures?: No  CIWA:    COWS:     Treatment Plan Summary: Daily contact with patient to assess and evaluate symptoms and progress in treatment Medication management  Plan:1. Admit for crisis management and stabilization. 2. Medication management to reduce current symptoms to base line and improve the  patient's overall level of functioning:  -Continue Latuda 40mg  daily after supper for mood/delusions/psychosis -Cogentin 0.5mg  daily after supper for EPS prevention -Continue Trazodone 100mg  qhs as needed for insomnia 3. Treat health problems as indicated. 4. Develop treatment plan to decrease risk of relapse upon discharge and the need for  readmission. 5. Psycho-social education regarding relapse prevention and self care. 6. Health care follow up as needed for medical problems. 7. Possible discharge on 04./07/15  Medical Decision Making Problem Points:  Established problem, worsening (2), Review of last therapy  session (1) and Review of psycho-social stressors (1) Data Points:  Order Aims Assessment (2) Review or order clinical lab tests (1) Review of medication regiment & side effects (2) Review of new medications or change in dosage (2)  I certify that inpatient services furnished can reasonably be expected to improve the patient's condition.   Thedore Mins, MD 02/25/2014, 10:34 AM

## 2014-02-26 MED ORDER — TRAZODONE HCL 100 MG PO TABS: 100.0000 mg | ORAL_TABLET | Freq: Every day | ORAL | Status: DC

## 2014-02-26 MED ORDER — BENZTROPINE MESYLATE 0.5 MG PO TABS: 0.5000 mg | ORAL_TABLET | Freq: Every day | ORAL | Status: DC

## 2014-02-26 MED ORDER — LURASIDONE HCL 40 MG PO TABS: 40.0000 mg | ORAL_TABLET | Freq: Every day | ORAL | Status: DC

## 2014-02-26 NOTE — Progress Notes (Signed)
Patient ID: Makayla Medina, female   DOB: 1986-05-18, 28 y.o.   MRN: [id number] D: Patient discharged home per MD order.  She received all personal belongings, prescriptions and medication samples.  Patient will follow up with therapist at [facility] today.  She denies any SI/HI/AVH.  She was satisfied with her treatment here and was eager for discharge.  Patient left ambulatory with her boyfriend.

## 2014-02-26 NOTE — [facility] Suicide Risk Assessment (Signed)
   Demographic Factors:  Adolescent or young adult, Low socioeconomic status and student  Total Time spent with patient: 20 minutes  Psychiatric Specialty Exam: Physical Exam  Psychiatric: She has a normal mood and affect. Her speech is normal and behavior is normal. Judgment and thought content normal. Cognition and memory are normal.    Review of Systems  Constitutional: Negative.   HENT: Negative.   Eyes: Negative.   Respiratory: Negative.   Cardiovascular: Negative.   Gastrointestinal: Negative.   Genitourinary: Negative.   Musculoskeletal: Negative.   Skin: Negative.   Neurological: Negative.   Endo/Heme/Allergies: Negative.   Psychiatric/Behavioral: Negative.     Blood pressure 109/70, pulse 88, temperature 96.9 F (36.1 C), temperature source Oral, resp. rate 20, height 5' 5.5" (1.664 m), weight 79.833 kg (176 lb), last menstrual period 01/19/2014, unknown if currently breastfeeding.Body mass index is 28.83 kg/(m^2).  General Appearance: Fairly Groomed  Patent attorneyye Contact::  Good  Speech:  Clear and Coherent and Normal Rate  Volume:  Normal  Mood:  Euthymic  Affect:  Appropriate  Thought Process:  Goal Directed  Orientation:  Full (Time, Place, and Person)  Thought Content:  Negative  Suicidal Thoughts:  No  Homicidal Thoughts:  No  Memory:  Immediate;   Fair Recent;   Fair Remote;   Fair  Judgement:  Fair  Insight:  Fair  Psychomotor Activity:  Normal  Concentration:  Fair  Recall:  [employer]Fair  Fund of Knowledge:Good  Language: Fair  Akathisia:  No  Handed:  Right  AIMS (if indicated):     Assets:  Communication Skills Desire for Improvement Physical Health  Sleep:  Number of Hours: 5.75    Musculoskeletal: Strength & Muscle Tone: within normal limits Gait & Station: normal Patient leans: N/A   Mental Status Per Nursing Assessment::   On Admission:     Current Mental Status by Physician: patient denies suicidal ideation, intent or plan  Loss  Factors: Financial problems/change in socioeconomic status  Historical Factors: Impulsivity  Risk Reduction Factors:   Sense of responsibility to family and Positive social support  Continued Clinical Symptoms:  Alcohol/Substance Abuse/Dependencies  Cognitive Features That Contribute To Risk:  Closed-mindedness Polarized thinking    Suicide Risk:  Minimal: No identifiable suicidal ideation.  Patients presenting with no risk factors but with morbid ruminations; may be classified as minimal risk based on the severity of the depressive symptoms  Discharge Diagnoses:   AXIS I:  Schizoaffective disorder              Cannabis use disorder AXIS II:  Deferred AXIS III:   Past Medical History  Diagnosis Date  . Vaginal Pap smear, abnormal   . Gonorrhea   . Chlamydia    AXIS IV:  other psychosocial or environmental problems and problems related to social environment AXIS V:  61-70 mild symptoms  Plan Of Care/Follow-up recommendations:  Activity:  as tolerated Diet:  healthy Tests:  routine  Other:  patient to keep her after care appointment  Is patient on multiple antipsychotic therapies at discharge:  No   Has Patient had three or more failed trials of antipsychotic monotherapy by history:  No  Recommended Plan for Multiple Antipsychotic Therapies: NA    [name], Nicolle Heward, MD 02/26/2014, 9:15 AM
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
## 2014-02-26 NOTE — Progress Notes (Signed)
[facility] :  Will you be returning to the same living situation after discharge: Yes,  home with family At discharge, do you have transportation home?:Yes,  friend will come and pick her up Do you have the ability to pay for your medications:Yes,  no barriers  Release of information consent forms completed and in the chart;  Patient's signature needed at discharge.  Patient to Follow up at: Follow-up Information   Follow up with [facility] On 02/26/2014. (Spoke with Mrs. Andria MeuseCraft (therapist) reports you need to go to counseling center and follow up with on call worker tomorrow.  From there they will meet with you and schedule your next appointment. )    Contact information:   [address]  [address] [place] [phone]      [facility] On 03/20/2014. (Appointment for medicaitons at 6:00pm (please make note this appointment will be about 30-6745minutes long) )    Contact information:   [address], [place], [place] [address] [phone]      Patient denies SI/HI:   Yes,  no reports of SI    Safety Planning and Suicide Prevention discussed:  Yes,  see SI note.  Raye SorrowCoble, Jameek Bruntz N 02/26/2014, 9:29 AM

## 2014-03-01 NOTE — Progress Notes (Addendum)
Patient Discharge Instructions:  After Visit Summary (AVS):   Faxed to:  03/01/14 Discharge Summary Note:   Faxed to:  03/01/14 Psychiatric Admission Assessment Note:   Faxed to:  03/01/14 Suicide Risk Assessment - Discharge Assessment:   Faxed to:  03/01/14 Faxed/Sent to the Next Level Care provider:  03/01/14 Faxed to [facility] @ [phone]  Faxed to [facility] @ [phone]  Jerelene ReddenSheena E [place], 03/01/2014, 3:57 PM

## 2014-03-03 ENCOUNTER — Encounter (HOSPITAL_COMMUNITY): Payer: Self-pay | Admitting: Emergency Medicine

## 2014-03-03 ENCOUNTER — Emergency Department (HOSPITAL_COMMUNITY)
Admission: EM | Admit: 2014-03-03 | Discharge: 2014-03-04 | Disposition: A | Discharge status: Other Acute Inpatient Hospital | Payer: Medicaid Other | Attending: Emergency Medicine | Admitting: Emergency Medicine

## 2014-03-03 DIAGNOSIS — F259 Schizoaffective disorder, unspecified: Secondary | ICD-10-CM

## 2014-03-03 DIAGNOSIS — F29 Unspecified psychosis not due to a substance or known physiological condition: Secondary | ICD-10-CM | POA: Insufficient documentation

## 2014-03-03 DIAGNOSIS — Z0289 Encounter for other administrative examinations: Secondary | ICD-10-CM | POA: Insufficient documentation

## 2014-03-03 DIAGNOSIS — R4589 Other symptoms and signs involving emotional state: Secondary | ICD-10-CM

## 2014-03-03 DIAGNOSIS — F329 Major depressive disorder, single episode, unspecified: Secondary | ICD-10-CM

## 2014-03-03 DIAGNOSIS — Z79899 Other long term (current) drug therapy: Secondary | ICD-10-CM | POA: Insufficient documentation

## 2014-03-03 DIAGNOSIS — F319 Bipolar disorder, unspecified: Secondary | ICD-10-CM | POA: Insufficient documentation

## 2014-03-03 DIAGNOSIS — F32A Depression, unspecified: Secondary | ICD-10-CM

## 2014-03-03 DIAGNOSIS — F3289 Other specified depressive episodes: Secondary | ICD-10-CM | POA: Insufficient documentation

## 2014-03-03 DIAGNOSIS — Z3202 Encounter for pregnancy test, result negative: Secondary | ICD-10-CM | POA: Insufficient documentation

## 2014-03-03 DIAGNOSIS — Z8619 Personal history of other infectious and parasitic diseases: Secondary | ICD-10-CM | POA: Insufficient documentation

## 2014-03-03 DIAGNOSIS — R45850 Homicidal ideations: Secondary | ICD-10-CM | POA: Insufficient documentation

## 2014-03-03 LAB — RAPID URINE DRUG SCREEN, HOSP PERFORMED
AMPHETAMINES: NOT DETECTED
BARBITURATES: NOT DETECTED
BENZODIAZEPINES: NOT DETECTED
COCAINE: NOT DETECTED
Opiates: NOT DETECTED
Tetrahydrocannabinol: POSITIVE — AB

## 2014-03-03 LAB — COMPREHENSIVE METABOLIC PANEL
ALBUMIN: 4.6 g/dL (ref 3.5–5.2)
ALK PHOS: 75 U/L (ref 39–117)
ALT: 15 U/L (ref 0–35)
AST: 25 U/L (ref 0–37)
BILIRUBIN TOTAL: 0.6 mg/dL (ref 0.3–1.2)
BUN: 9 mg/dL (ref 6–23)
CHLORIDE: 102 meq/L (ref 96–112)
CO2: 24 mEq/L (ref 19–32)
Calcium: 10 mg/dL (ref 8.4–10.5)
Creatinine, Ser: 0.88 mg/dL (ref 0.50–1.10)
GFR calc Af Amer: >90 mL/min (ref >90)
GFR calc non Af Amer: 90 mL/min — ABNORMAL LOW (ref >90)
Glucose, Bld: 95 mg/dL (ref 70–99)
Potassium: 3.8 mEq/L (ref 3.7–5.3)
Sodium: 140 mEq/L (ref 137–147)
Total Protein: 8.1 g/dL (ref 6.0–8.3)

## 2014-03-03 LAB — ETHANOL: Alcohol, Ethyl (B): <11 mg/dL (ref 0–11)

## 2014-03-03 LAB — CBC
HEMATOCRIT: 41.7 % (ref 36.0–46.0)
Hemoglobin: 13.8 g/dL (ref 12.0–15.0)
MCH: 29 pg (ref 26.0–34.0)
MCHC: 33.1 g/dL (ref 30.0–36.0)
MCV: 87.6 fL (ref 78.0–100.0)
PLATELETS: 217 10*3/uL (ref 150–400)
RBC: 4.76 MIL/uL (ref 3.87–5.11)
RDW: 12.7 % (ref 11.5–15.5)
WBC: 10.2 10*3/uL (ref 4.0–10.5)

## 2014-03-03 LAB — PREGNANCY, URINE: PREG TEST UR: NEGATIVE

## 2014-03-03 LAB — SALICYLATE LEVEL: Salicylate Lvl: <2 mg/dL — ABNORMAL LOW (ref 2.8–20.0)

## 2014-03-03 LAB — ACETAMINOPHEN LEVEL: Acetaminophen (Tylenol), Serum: <15 ug/mL (ref 10–30)

## 2014-03-03 MED ORDER — LURASIDONE HCL 40 MG PO TABS
40.0000 mg | ORAL_TABLET | Freq: Every day | ORAL | Status: DC
  Administered 2014-03-03: 40 mg via ORAL
  Filled 2014-03-03 (×2): qty 1

## 2014-03-03 MED ORDER — BENZTROPINE MESYLATE 1 MG PO TABS
0.5000 mg | ORAL_TABLET | Freq: Every day | ORAL | Status: DC
  Administered 2014-03-03: 0.5 mg via ORAL
  Filled 2014-03-03: qty 1
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
## 2014-03-03 NOTE — ED Notes (Signed)
Pt family, Mom and Dad took pts belongings with them
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
## 2014-03-04 ENCOUNTER — Encounter (HOSPITAL_COMMUNITY): Payer: Self-pay | Admitting: *Deleted

## 2014-03-04 ENCOUNTER — Inpatient Hospital Stay (HOSPITAL_COMMUNITY)
Admission: AD | Admit: 2014-03-04 | Discharge: 2014-03-12 | DRG: 885 | Disposition: A | Discharge status: Home / Self Care | Payer: Medicaid Other | Source: Intra-hospital | Attending: Psychiatry | Admitting: Psychiatry

## 2014-03-04 DIAGNOSIS — Z5987 Material hardship due to limited financial resources, not elsewhere classified: Secondary | ICD-10-CM

## 2014-03-04 DIAGNOSIS — F259 Schizoaffective disorder, unspecified: Principal | ICD-10-CM | POA: Diagnosis present

## 2014-03-04 DIAGNOSIS — Z598 Other problems related to housing and economic circumstances: Secondary | ICD-10-CM

## 2014-03-04 DIAGNOSIS — F22 Delusional disorders: Secondary | ICD-10-CM | POA: Diagnosis present

## 2014-03-04 DIAGNOSIS — F1210 Cannabis abuse, uncomplicated: Secondary | ICD-10-CM | POA: Diagnosis present

## 2014-03-04 DIAGNOSIS — F1220 Cannabis dependence, uncomplicated: Secondary | ICD-10-CM

## 2014-03-04 DIAGNOSIS — F411 Generalized anxiety disorder: Secondary | ICD-10-CM | POA: Diagnosis present

## 2014-03-04 MED ORDER — LURASIDONE HCL 40 MG PO TABS
20.0000 mg | ORAL_TABLET | Freq: Every day | ORAL | Status: DC
  Administered 2014-03-04: 17:00:00 via ORAL
  Filled 2014-03-04 (×3): qty 1

## 2014-03-04 MED ORDER — BENZTROPINE MESYLATE 0.5 MG PO TABS
0.5000 mg | ORAL_TABLET | Freq: Every day | ORAL | Status: DC
  Administered 2014-03-04: 0.5 mg via ORAL
  Filled 2014-03-04 (×3): qty 1

## 2014-03-04 MED ORDER — LORAZEPAM 1 MG PO TABS
2.0000 mg | ORAL_TABLET | Freq: Once | ORAL | Status: AC
  Administered 2014-03-04: 2 mg via ORAL
  Filled 2014-03-04: qty 2

## 2014-03-04 MED ORDER — LURASIDONE HCL 40 MG PO TABS
20.0000 mg | ORAL_TABLET | Freq: Every day | ORAL | Status: DC
  Filled 2014-03-04: qty 1

## 2014-03-04 NOTE — ED Notes (Signed)
Pt taking a shower 

## 2014-03-04 NOTE — ED Notes (Signed)
Pt on phone cussing at family member.

## 2014-03-04 NOTE — Progress Notes (Signed)
The focus of this group is to help patients review their daily goal of treatment and discuss progress on daily workbooks. Pt attended the evening group session and responded to all discussion prompts from the Writer. Pt shared that today was a bad day and that she was not happy to be back in the hospital. Pt reported having no additional needs from Nursing Staff this evening. When asked how she appears when well, Pt responded, "Like this. I am well. I shouldn't be here. All I can do is go to sleep and try to make tomorrow better." Pt's affect was upset.

## 2014-03-04 NOTE — Progress Notes (Signed)
28 year old female pt admitted on involuntary basis. Pt, on admission, angry and irritable about being [facility]. Pt spoke about how if someone hurts her child she becomes very angry and will hurt that person, pt also spoke about her mother and upset that mother had her committed here. Pt was able to state that she was unable to get her medications filled and would like to be placed back on latuda while she is here. Pt denied any pain on admission and denied any SI and able to contract for safety on the unit. Pt was oriented to the unit and safety maintained.

## 2014-03-04 NOTE — Progress Notes (Signed)
The patient states she stopped taking her medications because she could not afford them.  She lost her Medicaid card and has not gotten a replacement.
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
## 2014-03-04 NOTE — ED Notes (Signed)
[employer] called for transport 

## 2014-03-04 NOTE — ED Notes (Signed)
Copy made of patients rights and placed in pt belongings. Report [facility]els[facility]Lindsey [facility].

## 2014-03-04 NOTE — [facility] Counselor (Addendum)
[id number]. [facility]. Originals placed in pt's chart. Tresa EndoKelly, pt's RN, has contacted [employer] for transport.   Evette Cristalaroline Paige Xeng Kucher, [place] Assessment Counselor

## 2014-03-04 NOTE — ED Notes (Signed)
Pt up to nurses station asking for newspaper from her belongings. It was explained that her family took her belongings home. Pt became escalated and asked who gave  permission to send her belongings home with her family. Pt was explained she did sign her belongings sheet that states her family took her belongings. She states I dont remember signing that yesterday. I need a copy of everything I signed yesterday. It was explained to pt she could have a copy of the belongings sheet. Pt walked away and came back to nurses station and yells That was not my signature I want a copy of all my shit right now! I will sue all of you. You gave my property away! This is not right! Pt back to room.
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
## 2014-03-04 NOTE — ED Notes (Signed)
Pt tearful when found out news [facility] would [facility]. Pt worried her daughter would not be able to visit. TTS Idalia Needleaige spoke to pt about visiting hours.
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
## 2014-03-05 DIAGNOSIS — F259 Schizoaffective disorder, unspecified: Principal | ICD-10-CM

## 2014-03-05 MED ORDER — HALOPERIDOL 2 MG PO TABS
2.0000 mg | ORAL_TABLET | Freq: Two times a day (BID) | ORAL | Status: DC
  Administered 2014-03-05 – 2014-03-07 (×5): 2 mg via ORAL
  Filled 2014-03-05 (×8): qty 1

## 2014-03-05 MED ORDER — ACETAMINOPHEN 325 MG PO TABS: 650.0000 mg | ORAL_TABLET | Freq: Four times a day (QID) | ORAL | Status: DC | PRN

## 2014-03-05 MED ORDER — BENZTROPINE MESYLATE 0.5 MG PO TABS
0.5000 mg | ORAL_TABLET | Freq: Two times a day (BID) | ORAL | Status: DC
Start: 2014-03-05 — End: 2014-03-12
  Administered 2014-03-05 – 2014-03-12 (×14): 0.5 mg via ORAL
  Filled 2014-03-05 (×14): qty 1
  Filled 2014-03-05 (×2): qty 6
  Filled 2014-03-05 (×2): qty 1

## 2014-03-05 MED ORDER — CARBAMAZEPINE ER 200 MG PO TB12
200.0000 mg | ORAL_TABLET | Freq: Two times a day (BID) | ORAL | Status: DC
Start: 2014-03-05 — End: 2014-03-12
  Administered 2014-03-05 – 2014-03-12 (×15): 200 mg via ORAL
  Filled 2014-03-05 (×11): qty 1
  Filled 2014-03-05: qty 6
  Filled 2014-03-05 (×3): qty 1
  Filled 2014-03-05: qty 6
  Filled 2014-03-05 (×4): qty 1

## 2014-03-05 MED ORDER — MAGNESIUM HYDROXIDE 400 MG/5ML PO SUSP: 30.0000 mL | Freq: Every day | ORAL | Status: DC | PRN

## 2014-03-05 MED ORDER — TRAZODONE HCL 50 MG PO TABS
50.0000 mg | ORAL_TABLET | Freq: Every evening | ORAL | Status: DC | PRN
  Administered 2014-03-05 – 2014-03-11 (×4): 50 mg via ORAL
  Filled 2014-03-05: qty 3
  Filled 2014-03-05 (×2): qty 1
  Filled 2014-03-05: qty 3
  Filled 2014-03-05 (×2): qty 1

## 2014-03-05 MED ORDER — OLANZAPINE 10 MG PO TBDP
10.0000 mg | ORAL_TABLET | Freq: Three times a day (TID) | ORAL | Status: DC | PRN
  Filled 2014-03-05: qty 1

## 2014-03-05 MED ORDER — ALUM & MAG HYDROXIDE-SIMETH 200-200-20 MG/5ML PO SUSP: 30.0000 mL | ORAL | Status: DC | PRN

## 2014-03-05 NOTE — Progress Notes (Signed)
Patient resting quietly with eyes closed. Respirations even and unlabored. No distress noted, Q 15 minute check continues as ordred to maintain safety.  
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
## 2014-03-05 NOTE — [facility] Suicide Risk Assessment (Signed)
[facility] INPATIENT:  Family/Significant Other Suicide Prevention Education  Suicide Prevention Education:  Patient Refusal for Family/Significant Other Suicide Prevention Education: The patient Makayla Medina has refused to provide written consent for family/significant other to be provided Family/Significant Other Suicide Prevention Education during admission and/or prior to discharge.  Physician notified.  Patient just recently DC last week (April 2014).  Patient reports she is not returning with her mother, was not SI upon admission and not contact to be made at this time.    Evlyn CourierHannah N Mary-Ann Pennella 03/05/2014, 1:30 PM

## 2014-03-05 NOTE — [facility] Suicide Risk Assessment (Signed)
   Nursing information obtained from:    Demographic factors:    Current Mental Status:    Loss Factors:    Historical Factors:    Risk Reduction Factors:    Total Time spent with patient: 20 minutes  CLINICAL FACTORS:   Severe Anxiety and/or Agitation Depression:   Aggression Comorbid alcohol abuse/dependence Delusional Impulsivity Insomnia Alcohol/Substance Abuse/Dependencies Schizophrenia:   Less than 27 years old Paranoid or undifferentiated type Currently Psychotic Unstable or Poor Therapeutic Relationship  Psychiatric Specialty Exam: Physical Exam  Psychiatric: Her affect is angry and labile. Her speech is rapid and/or pressured and tangential. She is agitated and aggressive. Thought content is paranoid and delusional. Cognition and memory are normal. She expresses impulsivity. She expresses homicidal ideation.    Review of Systems  Constitutional: Negative.   HENT: Negative.   Eyes: Negative.   Respiratory: Negative.   Cardiovascular: Negative.   Gastrointestinal: Negative.   Genitourinary: Negative.   Musculoskeletal: Negative.   Skin: Negative.   Endo/Heme/Allergies: Negative.   Psychiatric/Behavioral: Positive for hallucinations and substance abuse. The patient is nervous/anxious.     Blood pressure 101/66, pulse 67, temperature 97.8 F (36.6 C), temperature source Oral, resp. rate 20, height 5\' 6"  (1.676 m), weight 82.555 kg (182 lb), last menstrual period 01/19/2014, unknown if currently breastfeeding.Body mass index is 29.39 kg/(m^2).  General Appearance: Disheveled  Eye Contact::  Good  Speech:  Pressured  Volume:  Increased  Mood:  Angry and Irritable  Affect:  Labile and Full Range  Thought Process:  Circumstantial and Disorganized  Orientation:  Full (Time, Place, and Person)  Thought Content:  Delusions and Paranoid Ideation  Suicidal Thoughts:  No  Homicidal Thoughts:  Yes.  without intent/plan  Memory:  Immediate;   Fair Recent;   Fair Remote;    Fair  Judgement:  Poor  Insight:  Lacking  Psychomotor Activity:  Increased  Concentration:  Fair  Recall:  [employer]  Fund of Knowledge:Fair  Language: Good  Akathisia:  No  Handed:  Right  AIMS (if indicated):     Assets:  Communication Skills Desire for Improvement Physical Health Social Support  Sleep:  Number of Hours: 6   Musculoskeletal: Strength & Muscle Tone: within normal limits Gait & Station: normal Patient leans: N/A  COGNITIVE FEATURES THAT CONTRIBUTE TO RISK:  Closed-mindedness Polarized thinking    SUICIDE RISK:   Minimal: No identifiable suicidal ideation.  Patients presenting with no risk factors but with morbid ruminations; may be classified as minimal risk based on the severity of the depressive symptoms  PLAN OF CARE:1. Admit for crisis management and stabilization. 2. Medication management to reduce current symptoms to base line and improve the     patient's overall level of functioning 3. Treat health problems as indicated. 4. Develop treatment plan to decrease risk of relapse upon discharge and the need for     readmission. 5. Psycho-social education regarding relapse prevention and self care. 6. Health care follow up as needed for medical problems. 7. Restart home medications where appropriate.   I certify that inpatient services furnished can reasonably be expected to improve the patient's condition.  Thedore MinsMojeed Helma Argyle, MD 03/05/2014, 10:34 AM

## 2014-03-05 NOTE — Progress Notes (Signed)
Patient in her room during this assessment. Mood and affect flat and depressed. She reported that her day went well. Denied SI/HI and denied Hallucinations. She requested for her HS medication early. Writer told patient to come for the medication at 2100; because she might wake up early if she took her medication too early. Writer encouraged and supported patient. Patient receptive to encouragement and support. Q 15 minute check continues as ordered to maintain safety.

## 2014-03-05 NOTE — [facility] Group Notes (Signed)
[facility] LCSW Group Therapy  03/05/2014 12:44 PM  Type of Therapy:  Group Therapy  Participation Level:  Did Not Attend  [name] was part of group for only a few minutes and then left very angry and irritated.  Did not return.    Makayla SorrowHannah N Aivy Medina 03/05/2014, 12:44 PM
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
## 2014-03-05 NOTE — Progress Notes (Signed)
D: Patient denies SI/HI and A/V hallucinations; patient reports that " Im ok"; patient reports that she slept well and that she is not having any pain; patient   A: Monitored q 15 minutes; patient encouraged to attend groups; patient educated about medications; patient given medications per physician orders; patient encouraged to express feelings and/or concerns  R: Patient is pleasant and will answer your questions when prompted but does not seem to understand why she is here; patient during superficial conversation appears to be logical and coherent but the deeper the conversation she can make some bizarre; patient's interaction with staff and peers is minimal; patient was able to set goal to talk with staff 1:1 when having feelings of SI; patient is taking medications as prescribed and tolerating medications; patient is attending all groups
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
## 2014-03-06 DIAGNOSIS — F1220 Cannabis dependence, uncomplicated: Secondary | ICD-10-CM

## 2014-03-06 NOTE — Progress Notes (Signed)
Patient ID: Makayla Medina, female   DOB: 02/09/1986, 28 y.o.   MRN: [id number] D: "Can you put a good word in so I can discharge by Friday, I want to go to my daughter's soccer game this weekend". "My mother is the biggest stressor in my life and I am moving out once I leave here".  Pt talked about finishing her thesis but said she is working on her undergraduate studies and had to drop out of school.  Pt seem to have no insight to her disease and why she is here. Pt mood/ flat appeared depressed and flat. Pt denies SI/HI/AVH and pain. Pt attended evening wrap up group and engaged in discussions. Pt denies any needs or concerns.  Cooperative with assessment. No acute distressed noted at this time.   A: Met with pt 1:1. Medications administered as prescribed. Writer encouraged pt to discuss feelings. Pt encouraged to come to staff with any question or concerns.   R: Patient remains safe. She is complaint with medications and denies any adverse reaction. Continue current POC.  

## 2014-03-06 NOTE — [facility] Group Notes (Signed)
[facility]  03/06/2014  12:39 PM  Type of Therapy:  [facility] Presentation   Participation Level:  Active  Participation Quality:  Appropriate  Affect:  Blunted  Cognitive:  Appropriate  Insight:  Engaged  Engagement in Therapy:  Engaged  Modes of Intervention:  Discussion, Education and Socialization   Summary of Progress/Problems:  [name] from [facility] came to present his recovery story and play the guitar.  Makayla Medina asked the speaker to clarify the definition of schizoaffective disorder.  This lead to a discussion about different symptoms associated with bipolar and schizophrenia disorders.  Makayla Medina asked about the location of [facility] and the services provided.  She became slightly frustrated with another member when that member asked about the location again. She thanked the speaker for coming in.  Makayla Medina was engaged and attentive throughout group.    Makayla Medina   03/06/2014  12:39 PM

## 2014-03-06 NOTE — Progress Notes (Signed)
Patient's ACT team referral has been faxed. Awaiting review. Patient [facility].  Ashley JacobsHannah Nail, MSW, LCSW Clinical Lead [phone]
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
## 2014-03-06 NOTE — Progress Notes (Addendum)
D: Patient denies SI/HI and A/V hallucinations; patient reports that she slept well; patient reported per self inventory that she wanted staff to know that she loves her daughter with all of her heart but that she loves God more.  A: Monitored q 15 minutes; patient encouraged to attend groups; patient educated about medications; patient given medications per physician orders; patient encouraged to express feelings and/or concerns  R: Patient is cautious and ask the same questions about her medications every time before she takes them; patient can get irritable at times but today patient is brighter than prior day; patient is more engaging today and more talkative; patient is well groomed; ; patient is taking medications as prescribed and tolerating medications; patient is attending all groups

## 2014-03-06 NOTE — Progress Notes (Signed)
The focus of this group is to help patients review their daily goal of treatment and discuss progress on daily workbooks. Pt attended the evening group session and responded to all discussion prompts from the Writer. Pt shared that today was an okay day, the highlight of which was attending the [facility] Group. Pt expressed interest in attending a women's class they offer upon discharge. Pt's only additional need from Nursing Staff this evening was to receive towels, which were given to her following group. Pt stated that she planned to stay well upon discharge by not making any more threats. "I actually didn't make any threats. I'm just not going to tell anyone in the future when I have a weapon on me except the police. They think I'm making threats just by having a knife on me." Pt appeared distracted and unfocused during group.

## 2014-03-07 MED ORDER — HALOPERIDOL 5 MG PO TABS
5.0000 mg | ORAL_TABLET | Freq: Two times a day (BID) | ORAL | Status: DC
  Administered 2014-03-07 – 2014-03-12 (×10): 5 mg via ORAL
  Filled 2014-03-07 (×2): qty 1
  Filled 2014-03-07 (×2): qty 6
  Filled 2014-03-07 (×10): qty 1
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
## 2014-03-07 NOTE — Progress Notes (Addendum)
D:  Patient's self inventory sheet, patient sleeps well, good appetite, normal energy level, good attention span.  Rated depression and hopeless 1, denied anxiety.  Denied withdrawals.  Denied SI.  Denied pain.  Plans to stay regulated on medicine and move away from stressors.  Does have discharge plans.  No problems taking meds after discharge. A:  Medications administered per MD orders.  Emotional support and encouragement given patient. R:  Denied SI and HI.  Contracts for safety.  Denied A/V hallucinations.  Will continue to monitor patient for safety with 15 minute checks.  Safety maintained.

## 2014-03-07 NOTE — ED Provider Notes (Signed)
Medical screening examination/treatment/procedure(s) were performed by non-physician practitioner and as supervising physician I was immediately available for consultation/collaboration.   EKG Interpretation None        Richardean Canalavid H Yao, MD 03/07/14 1436

## 2014-03-07 NOTE — Progress Notes (Signed)
The focus of this group is to educate the patient on the purpose and policies of crisis stabilization and provide a format to answer questions about their admission.  The group details unit policies and expectations of patients while admitted.  Patient attended 0900 nurse education orientation group this morning.  Patient listened attentively, appropriate affect, alert, appropriate insight and engagement.  Today patient will work on 3 goals for discharge.  

## 2014-03-07 NOTE — Progress Notes (Signed)
Patient ID: Makayla Medina, female   DOB: [birth year], 28 y.o.   MRN: [id number] D: Pt has been appropriate most of the evening. Pt is observed interacting well with peers.  Pt mood/ flat appeared depressed and flat. Pt denies SI/HI/AVH and pain. Pt attended evening karaoke group and was supportive with peers. Pt denies any needs or concerns.  Cooperative with assessment. No acute distressed noted at this time.   A: Met with pt 1:1. Medications administered as prescribed. Writer encouraged pt to discuss feelings. Pt encouraged to come to staff with any question or concerns.   R: Patient remains safe. She is complaint with medications and denies any adverse reaction. Continue current POC.
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
## 2014-03-08 NOTE — [facility] Group Notes (Signed)
[facility] LCSW Aftercare Discharge Planning Group Note   03/08/2014 10:44 AM  Participation Quality:  Active  Mood/Affect:  Anxious  Depression Rating:    Anxiety Rating:    Thoughts of Suicide:  No Will you contract for safety?   NA  Current AVH:  No  Plan for Discharge/Comments:  Sanae was excited about having services with [facility] transition team and ACT team, since they could help her with housing options.  Cindie Crumbly met with the [facility] transition team yesterday.  Will need to find out a designated discharge date before the transition team can start services.  Athelene reports that her mother is currently the caregiver for her daughter; therefore, it will be difficult for Shirlean to stay away from her mother.  She plans to sign up for summer classes since she needs the financial aid to pay for rent.  She reports that she would like to get an apartment for her and her boyfriend to stay.  Chelcy states that she will need letters for food stamp and school.    Transportation Means: Boyfriend  Supports:  Boyfriend, [place], and Daughter   Cassandria Santee
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
## 2014-03-08 NOTE — Progress Notes (Signed)
Patient ID: Makayla FileBreana Berendt, female   DOB: 03/02/1986, 28 y.o.   MRN: [id number] [facility]  03/08/2014 10:53 AM Makayla Medina  MRN:  [id number] Subjective:   Patient states "I'm still upset that my mother took out paper on me and sent me to the hospital. I carry knifes on me just to protect myself, I did not know I can't do that without getting permission from the police but now I know better."   Objective:  Patient is seen and her chart is reviewed. Patient endorsed decreased depression, agitation and mood swings. However, she remains paranoid and extremely delusional. She is religiously preoccupied and believes that her mother put her in the hospital because she is trying to take her 27 year old daughter away from her Patient is compliant with her current medications and unit milieu. Diagnosis:   DSM5: Total Time spent with patient: 20 minutes Schizophrenia Disorders:  Obsessive-Compulsive Disorders:  Trauma-Stressor Disorders:  Substance/Addictive Disorders: Cannabis Use Disorder - Moderate 9304.30)  Depressive Disorders:  AXIS I: Schizoaffective disorder-bipolar type  AXIS II: Deferred  AXIS III:  Past Medical History   Diagnosis  Date   .  Vaginal Pap smear, abnormal     AXIS IV: economic problems, other psychosocial or environmental problems and problems with primary support group  AXIS V: 41-50 Serious Symptoms   ADL's:  Intact  Sleep: Fair  Appetite:  Fair  Suicidal Ideation:  Denies Homicidal Ideation:  Denies AEB (as evidenced by):  Psychiatric Specialty Exam: Physical Exam  Review of Systems  Constitutional: Negative.   HENT: Negative.   Eyes: Negative.   Respiratory: Negative.   Cardiovascular: Negative.   Gastrointestinal: Negative.   Genitourinary: Negative.   Musculoskeletal: Negative.   Skin: Negative.   Neurological: Negative.   Endo/Heme/Allergies: Negative.   Psychiatric/Behavioral: Positive for depression and substance abuse.  Negative for suicidal ideas, hallucinations and memory loss. The patient is nervous/anxious and has insomnia.     Blood pressure 109/70, pulse 68, temperature 97.6 F (36.4 C), temperature source Oral, resp. rate 16, height 5\' 6"  (1.676 m), weight 82.555 kg (182 lb), last menstrual period 01/19/2014, unknown if currently breastfeeding.Body mass index is 29.39 kg/(m^2).  General Appearance: Casual and Fairly Groomed  Patent attorneyye Contact::  Fair  Speech:  Pressured  Volume:  Increased  Mood:  Anxious  Affect:  Labile  Thought Process:  Circumstantial  Orientation:  Full (Time, Place, and Person)  Thought Content:  Delusions and Paranoid Ideation  Suicidal Thoughts:  No  Homicidal Thoughts:  No  Memory:  Immediate;   Fair Recent;   Fair Remote;   Fair  Judgement:  Impaired  Insight:  Lacking  Psychomotor Activity:  Increased and Restlessness  Concentration:  Fair  Recall:  FiservFair  Fund of Knowledge:Fair  Language: Good  Akathisia:  No  Handed:  Right  AIMS (if indicated):     Assets:  Communication Skills Desire for Improvement Leisure Time Resilience Social Support  Sleep:  Number of Hours: 5.75   Musculoskeletal: Strength & Muscle Tone: within normal limits Gait & Station: normal Patient leans: N/A  Current Medications: Current Facility-Administered Medications  Medication Dose Route Frequency Provider Last Rate Last Dose  . acetaminophen (TYLENOL) tablet 650 mg  650 mg Oral Q6H PRN Fransisca KaufmannLaura Davis, NP      . alum & mag hydroxide-simeth (MAALOX/MYLANTA) 200-200-20 MG/5ML suspension 30 mL  30 mL Oral Q4H PRN Fransisca KaufmannLaura Davis, NP      . benztropine (COGENTIN) tablet 0.5 mg  0.5 mg Oral BID Keimya Briddell   0.5 mg at 03/08/14 0813  . carbamazepine (TEGRETOL XR) 12 hr tablet 200 mg  200 mg Oral BID Arnita Koons   200 mg at 03/08/14 0813  . haloperidol (HALDOL) tablet 5 mg  5 mg Oral BID Fransisca KaufmannLaura Davis, NP   5 mg at 03/08/14 0813  . magnesium hydroxide (MILK OF MAGNESIA) suspension 30 mL  30  mL Oral Daily PRN Fransisca KaufmannLaura Davis, NP      . OLANZapine zydis (ZYPREXA) disintegrating tablet 10 mg  10 mg Oral Q8H PRN Naftuli Dalsanto      . traZODone (DESYREL) tablet 50 mg  50 mg Oral QHS PRN Fransisca KaufmannLaura Davis, NP   50 mg at 03/07/14 2129    Lab Results: No results found for this or any previous visit (from the past 48 hour(s)).  Physical Findings: AIMS: Facial and Oral Movements Muscles of Facial Expression: None, normal Lips and Perioral Area: None, normal Jaw: None, normal Tongue: None, normal,Extremity Movements Upper (arms, wrists, hands, fingers): None, normal Lower (legs, knees, ankles, toes): None, normal, Trunk Movements Neck, shoulders, hips: None, normal, Overall Severity Severity of abnormal movements (highest score from questions above): None, normal Incapacitation due to abnormal movements: None, normal Patient's awareness of abnormal movements (rate only patient's report): No Awareness, Dental Status Current problems with teeth and/or dentures?: No Does patient usually wear dentures?: No  CIWA:  CIWA-Ar Total: 1 COWS:  COWS Total Score: 1  Treatment Plan Summary: Daily contact with patient to assess and evaluate symptoms and progress in treatment Medication management  Plan: 1. Continue crisis management and stabilization.  2. Medication management: Reviewed with patient who stated no untoward effects.  -Continue Cogentin 0.5 mg BID for EPS prevention. -Tegretol XR 200 mg BID for improved mood stability. -Continue Haldol to 5 mg BID for psychosis. -Continue Trazodone 50 mg hs prn insomnia.  3. Encouraged patient to attend groups and participate in group counseling sessions and activities.  4. Discharge plan in progress.  5. Continue current treatment plan.  6. Address health issues: Vitals reviewed and stable.  7. Tegretol level on 03/11/14 Medical Decision Making Problem Points:  Established problem, improving (1), Review of last therapy session (1) and Review of  psycho-social stressors (1) Data Points:  Order Aims Assessment (2) Review or order clinical lab tests (1) Review and summation of old records (2) Review of medication regiment & side effects (2)  I certify that inpatient services furnished can reasonably be expected to improve the patient's condition.   Thedore MinsMojeed Kelcey Korus, MD 03/08/2014, 10:53 AM

## 2014-03-08 NOTE — Progress Notes (Signed)
[facility] Group Notes:  (Nursing/MHT/Case Management/Adjunct)  Date:  03/08/2014  Time:  8:00 p.m.   Type of Therapy:  Psychoeducational Skills  Participation Level:  Active  Participation Quality:  Appropriate  Affect:  Blunted  Cognitive:  Lacking  Insight:  Lacking  Engagement in Group:  Developing/Improving  Modes of Intervention:  Education  Summary of Progress/Problems: The patient shared in group this evening that she had a good day overall and that she was visited by her daughter. She then went on to discuss that D[place] Is currently investigating her and that this was due to her mother's complaint. She feels that no one should simply listen to her mother, rather, the information should come from her since she is the parent and has a better understanding of the situation. As a goal for tomorrow, she would like to stop crying over certain situations and use prayer as a coping mechanism.   Westly PamBenjamin S Steffani Dionisio 03/08/2014, 9:50 PM

## 2014-03-08 NOTE — Progress Notes (Signed)
D: Patient denies SI/HI and A/V hallucinations; patient reports sleep is well; reports appetite is good; reports energy level is normal ; reports ability to pay attention is good; rates depression as 1/10; rates hopelessness 1/10; patient reports that it is hard when she does have the love from her mom; patient reports that she plans to isolate her stressors.....  A: Monitored q 15 minutes; patient encouraged to attend groups; patient educated about medications; patient given medications per physician orders; patient encouraged to express feelings and/or concerns  R: Patient is cooperative and more appropriate today but is tearful today thinking about the lack of support she feels form her mom; patient is engaging and attending all groups but is flat and depressed today; patient's interaction with staff and peers is appropriate;  patient is taking medications as prescribed and tolerating medications

## 2014-03-09 LAB — URINALYSIS, ROUTINE W REFLEX MICROSCOPIC
BILIRUBIN URINE: NEGATIVE
Glucose, UA: NEGATIVE mg/dL
Hgb urine dipstick: NEGATIVE
KETONES UR: NEGATIVE mg/dL
NITRITE: NEGATIVE
PH: 7 (ref 5.0–8.0)
PROTEIN: NEGATIVE mg/dL
Specific Gravity, Urine: 1.01 (ref 1.005–1.030)
Urobilinogen, UA: 0.2 mg/dL (ref 0.0–1.0)

## 2014-03-09 LAB — URINE MICROSCOPIC-ADD ON
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
## 2014-03-09 NOTE — Progress Notes (Signed)
Patient ID: Makayla Medina, female   DOB: October 16, 1986, 28 y.o.   MRN: [id number] [facility]  03/09/2014 4:54 PM Makayla FileBreana Medina  MRN:  [id number] Subjective:   Pt denies acute concerns. Pt reports her mom was asking about FMLA paperwork that they were requesting to be completed. Pt occasionally wakes up a few times at night, which is chronic. Appetite good. No SI/HI/AVH.  Objective:  Patient is seen (with female staff present) and her chart is reviewed. Patient still irritable on the unit. However, she remains paranoid and extremely delusional. She is religiously preoccupied and believes that her mother put her in the hospital because she is trying to take her 31 year old daughter away from her Patient is compliant with her current medications and unit milieu. Diagnosis:   DSM5: Total Time spent with patient: 15 minutes Schizophrenia Disorders:  Obsessive-Compulsive Disorders:  Trauma-Stressor Disorders:  Substance/Addictive Disorders: Cannabis Use Disorder - Moderate 9304.30)  Depressive Disorders:  AXIS I: Schizoaffective disorder-bipolar type  AXIS II: Deferred  AXIS III:  Past Medical History   Diagnosis  Date   .  Vaginal Pap smear, abnormal     AXIS IV: economic problems, other psychosocial or environmental problems and problems with primary support group  AXIS V: 41-50 Serious Symptoms   ADL's:  Intact  Sleep: Fair  Appetite:  Fair  Suicidal Ideation:  Denies Homicidal Ideation:  Denies AEB (as evidenced by):  Psychiatric Specialty Exam: Physical Exam  Review of Systems  Constitutional: Negative.   HENT: Negative.   Eyes: Negative.   Respiratory: Negative.   Cardiovascular: Negative.   Gastrointestinal: Negative.   Genitourinary: Negative.   Musculoskeletal: Negative.   Skin: Negative.   Neurological: Negative.   Endo/Heme/Allergies: Negative.   Psychiatric/Behavioral: Positive for depression and substance abuse. Negative for suicidal ideas,  hallucinations and memory loss. The patient is nervous/anxious and has insomnia.     Blood pressure 110/69, pulse 68, temperature 97.8 F (36.6 C), temperature source Oral, resp. rate 16, height 5\' 6"  (1.676 m), weight 82.555 kg (182 lb), last menstrual period 01/19/2014, unknown if currently breastfeeding.Body mass index is 29.39 kg/(m^2).  General Appearance: Casual and Fairly Groomed  Patent attorneyye Contact::  Fair  Speech:  Pressured  Volume:  Increased  Mood:  Anxious  Affect:  Labile  Thought Process:  Circumstantial  Orientation:  Full (Time, Place, and Person)  Thought Content:  Delusions and Paranoid Ideation  Suicidal Thoughts:  No  Homicidal Thoughts:  No  Memory:  Immediate;   Fair Recent;   Fair Remote;   Fair  Judgement:  Impaired  Insight:  Lacking  Psychomotor Activity:  Increased and Restlessness  Concentration:  Fair  Recall:  FiservFair  Fund of Knowledge:Fair  Language: Good  Akathisia:  No  Handed:  Right  AIMS (if indicated):     Assets:  Communication Skills Desire for Improvement Leisure Time Resilience Social Support  Sleep:  Number of Hours: 6.5   Musculoskeletal: Strength & Muscle Tone: within normal limits Gait & Station: normal Patient leans: N/A  Current Medications: Current Facility-Administered Medications  Medication Dose Route Frequency Provider Last Rate Last Dose  . acetaminophen (TYLENOL) tablet 650 mg  650 mg Oral Q6H PRN Fransisca KaufmannLaura Davis, NP      . alum & mag hydroxide-simeth (MAALOX/MYLANTA) 200-200-20 MG/5ML suspension 30 mL  30 mL Oral Q4H PRN Fransisca KaufmannLaura Davis, NP      . benztropine (COGENTIN) tablet 0.5 mg  0.5 mg Oral BID Mojeed Akintayo   0.5  mg at 03/09/14 0749  . carbamazepine (TEGRETOL XR) 12 hr tablet 200 mg  200 mg Oral BID Mojeed Akintayo   200 mg at 03/09/14 0749  . haloperidol (HALDOL) tablet 5 mg  5 mg Oral BID Fransisca KaufmannLaura Davis, NP   5 mg at 03/09/14 0749  . magnesium hydroxide (MILK OF MAGNESIA) suspension 30 mL  30 mL Oral Daily PRN Fransisca KaufmannLaura Davis,  NP      . OLANZapine zydis (ZYPREXA) disintegrating tablet 10 mg  10 mg Oral Q8H PRN Mojeed Akintayo      . traZODone (DESYREL) tablet 50 mg  50 mg Oral QHS PRN Fransisca KaufmannLaura Davis, NP   50 mg at 03/07/14 2129    Lab Results: No results found for this or any previous visit (from the past 48 hour(s)).  Physical Findings: AIMS: Facial and Oral Movements Muscles of Facial Expression: None, normal Lips and Perioral Area: None, normal Jaw: None, normal Tongue: None, normal,Extremity Movements Upper (arms, wrists, hands, fingers): None, normal Lower (legs, knees, ankles, toes): None, normal, Trunk Movements Neck, shoulders, hips: None, normal, Overall Severity Severity of abnormal movements (highest score from questions above): None, normal Incapacitation due to abnormal movements: None, normal Patient's awareness of abnormal movements (rate only patient's report): No Awareness, Dental Status Current problems with teeth and/or dentures?: No Does patient usually wear dentures?: No  CIWA:  CIWA-Ar Total: 1 COWS:  COWS Total Score: 1  Treatment Plan Summary: Daily contact with patient to assess and evaluate symptoms and progress in treatment Medication management  Plan: 1. Continue crisis management and stabilization.  2. Medication management: Reviewed with patient who stated no untoward effects.  -Continue Cogentin 0.5 mg BID for EPS prevention. -Tegretol XR 200 mg BID for improved mood stability. -Continue Haldol to 5 mg BID for psychosis. -Continue Trazodone 50 mg hs prn insomnia.  3. Encouraged patient to attend groups and participate in group counseling sessions and activities.  4. Discharge plan in progress.  5. Continue current treatment plan.  6. Address health issues: Vitals reviewed and stable.  7. Tegretol level on 03/11/14 Medical Decision Making Problem Points:  Established problem, improving (1), Review of last therapy session (1) and Review of psycho-social stressors (1) Data  Points:  Order Aims Assessment (2) Review or order clinical lab tests (1) Review and summation of old records (2) Review of medication regiment & side effects (2)  I certify that inpatient services furnished can reasonably be expected to improve the patient's condition.   Caprice KluverVinay P Benton Tooker, MD 03/09/2014, 4:54 PM

## 2014-03-09 NOTE — Progress Notes (Signed)
Patient ID: Makayla Medina, female   DOB: 1986/02/25, 28 y.o.   MRN: [id number] D. The patient has flat affect and depressed mood. Her speech is not congruent with her mood and affect. She describes having a great day today because she achieved her goal not to cry and was able to read and work on her research. When asked about her coping strategies she reported that she planned to sever all ties with her mother as her mother the stressor in her life. A. Encouraged the patient to attend evening group. Met with patient to assess. R. The patient attended evening group with minimal participation. Continues to minimize her hospitalization and blame her mother. Denies a/v hallucinations. Denies suicidal/homicidal ideation.

## 2014-03-09 NOTE — Progress Notes (Signed)
[facility] Group Notes:  (Nursing/MHT/Case Management/Adjunct)  Date:  03/09/2014  Time:  8:00 p.m.   Type of Therapy:  Psychoeducational Skills  Participation Level:  Active  Participation Quality:  Appropriate  Affect:  Appropriate  Cognitive:  Appropriate  Insight:  Improving  Engagement in Group:  Improving  Modes of Intervention:  Education  Summary of Progress/Problems: The patient shared in group this evening that she accomplished her goal for the day which was to avoid crying. The patient verbalized that she had a better day since she spoke with her doctor and devoted more time to reading. She expressed the need to eliminate her mother from her life because she feels that her mother is a major stressor. The patient did not mention however anything with regards to why CPS is involved with her children. In addition, the patient indicated that she intends to move in with her boyfriend following discharge. As for the theme for the day, her coping skills will be to listen to music and stay away from her mother.   Westly PamBenjamin S Daiki Dicostanzo 03/09/2014, 9:32 PM

## 2014-03-09 NOTE — Progress Notes (Signed)
Patient rested quietly throughout the night.  Safety maintained, Q 15 checks in place.

## 2014-03-09 NOTE — Progress Notes (Signed)
Patient ID: Marijo FileBreana Shallenberger, female   DOB: [birth year], 28 y.o.   MRN: [id number] Psychoeducational Group Note  Date:  03/09/2014 Time:0920am  Group Topic/Focus:  Identifying Needs:   The focus of this group is to help patients identify their personal needs that have been historically problematic and identify healthy behaviors to address their needs.  Participation Level:  Active  Participation Quality:  Appropriate  Affect:  Appropriate  Cognitive:  Disorganized  Insight:  Limited  Engagement in Group:  Limited  Additional Comments:  Healthy coping skills.   Valente DavidWeaver, Genola Yuille Brooks 03/09/2014,11:56 AM

## 2014-03-09 NOTE — [facility] Group Notes (Signed)
[facility] LCSW Group Therapy  03/09/2014 12:51 PM  Type of Therapy:  Group Therapy  Participation Level:  Active  Participation Quality:  Attentive and Sharing  Affect:  Defensive  Cognitive:  Alert and Oriented  Insight:  Limited  Engagement in Therapy:  Improving  Modes of Intervention:  Discussion, Exploration, Problem-solving, Rapport Building, Socialization and Support  Summary of Progress/Problems: The main focus of today's process group was to identify the patient's current support system and decide on other supports that can be put in place. An emphasis was placed on using counselor, doctor, therapy groups, 12-step groups, and problem-specific support groups to expand supports, as well as doing something different than has been done before.   Makayla Medina was observed to be engaged in group as she initially defined what she considers to be a characteristics within those who are a support for her. She shared that her boyfriend, father, and daughters are positive supports to her due to their ongoing understanding of her issues and ability to allow her to communicate her feelings, unlike her mother and sister per patient. Makayla Medina attributed her mother to be a negative support as she stated her mother to be the causation of CPS being involved in her life out of safety concerns for her daughter. Patient demonstrated limited insight to how her mistrust of others creates barriers within her support system, subsequently causing her to feel isolated and misunderstood.    Makayla KhanGregory C Pickett Jr. 03/09/2014, 12:51 PM

## 2014-03-09 NOTE — Progress Notes (Addendum)
Patient ID: Makayla Medina, female   DOB: Mar 15, 1986, 28 y.o.   MRN: [id number] 03-09-14 nursing shift note: D: pt has been visible in the milieu, taking her medications and going to groups. Other RN on the hall reported that she was irritable in the groups. She denies any si/hi/av A: she has not voiced any complaints, but can be delusional at times. Staff continues to support and encourage her. R: on her inventory sheet she wrote: slept well, appetite good, energy normal, attention good with her depression and hopelessness both at 1. She denied any w/d symptoms, pain or physical problems. After discharge she plans to " pack her stuff and have a good yard sale". RN will monitor and Q 15 min ck's continue.

## 2014-03-09 NOTE — Progress Notes (Signed)
Patient ID: Makayla Medina, female   DOB: Feb 16, 1986, 28 y.o.   MRN: [id number] Psychoeducational Group Note  Date:  03/09/2014 Time:0900am  Group Topic/Focus:  Identifying Needs:   The focus of this group is to help patients identify their personal needs that have been historically problematic and identify healthy behaviors to address their needs.  Participation Level:  Active  Participation Quality:  Appropriate  Affect:  Appropriate  Cognitive:  Disorganized  Insight:  Limited  Engagement in Group:  Limited  Additional Comments:  Inventory group   Makayla Medina, Makayla Medina 03/09/2014,11:55 AM

## 2014-03-10 NOTE — Progress Notes (Signed)
Did not attend group 

## 2014-03-10 NOTE — Progress Notes (Signed)
Adult Psychoeducational Group Note  Date:  03/10/2014 Time:  4:31 PM  Group Topic/Focus:  Managing Feelings:   The focus of this group is to identify what feelings patients have difficulty handling and develop a plan to handle them in a healthier way upon discharge.  Participation Level:  Active  Participation Quality:  Appropriate and Attentive  Affect:  Appropriate and Blunted  Cognitive:  Appropriate  Insight: Appropriate and Good  Engagement in Group:  Supportive  Modes of Intervention:  Education and Support  Additional Comments:  PT came to group  Daysie Helf R Cheryl Chay 03/10/2014, 4:31 PM

## 2014-03-10 NOTE — [facility] Group Notes (Signed)
[facility] LCSW Group Therapy  03/10/2014 11:15 AM  Type of Therapy:  Group Therapy  Participation Level:  Active  Participation Quality:  Drowsy and Sharing  Affect:  Depressed  Cognitive:  Oriented  Insight:  Limited  Engagement in Therapy:  Limited  Modes of Intervention:  Discussion, Rapport Building, Socialization and Support  Summary of Progress/Problems: Group focus today was on self care. There was discussion on what individual group members saw as self care and patient were then given opportunity to identify an area they could focus on upon discharge. Makayla Medina came in late to group and shared she "is looking forward to having my name vindicated." Patient shared belief that entire family other than one sister is against her in all matters.   [name]

## 2014-03-10 NOTE — Progress Notes (Signed)
Patient ID: Makayla Medina, female   DOB: [birth year], 28 y.o.   MR[id number] [facility]  03/10/2014 2:30 PM Makayla Medina  MRN:  [id number] Subjective:   Pt denies acute concerns. Pt likes her meds. Pt denies nightmares, since starting these meds. Pt plans to live with her boyfriend after discharge. Pt reports mom put in the paperwork for her to be admitted to the hospital. Sleep good. Appetite good. No SI/HI/AVH.  Objective:  Patient is seen and her chart is reviewed. Patient still irritable on the unit. However, she remains paranoid and extremely delusional. She is religiously preoccupied and believes that her mother put her in the hospital because she is trying to take her 27 year old daughter away from her. Patient is compliant with her current medications and unit milieu. Diagnosis:   DSM5: Total Time spent with patient: 15 minutes Schizophrenia Disorders:  Obsessive-Compulsive Disorders:  Trauma-Stressor Disorders:  Substance/Addictive Disorders: Cannabis Use Disorder - Moderate 9304.30)  Depressive Disorders:  AXIS I: Schizoaffective disorder-bipolar type  AXIS II: Deferred  AXIS III:  Past Medical History   Diagnosis  Date   .  Vaginal Pap smear, abnormal     AXIS IV: economic problems, other psychosocial or environmental problems and problems with primary support group  AXIS V: 41-50 Serious Symptoms   ADL's:  Intact  Sleep: Fair  Appetite:  Fair  Suicidal Ideation:  Denies Homicidal Ideation:  Denies AEB (as evidenced by):  Psychiatric Specialty Exam: Physical Exam  Review of Systems  Constitutional: Negative.   HENT: Negative.   Eyes: Negative.   Respiratory: Negative.   Cardiovascular: Negative.   Gastrointestinal: Negative.   Genitourinary: Negative.   Musculoskeletal: Negative.   Skin: Negative.   Neurological: Negative.   Endo/Heme/Allergies: Negative.   Psychiatric/Behavioral: Positive for depression and substance abuse. Negative for  suicidal ideas, hallucinations and memory loss. The patient is nervous/anxious and has insomnia.     Blood pressure 99/66, pulse 72, temperature 97.6 F (36.4 C), temperature source Oral, resp. rate 18, height 5\' 6"  (1.676 m), weight 82.555 kg (182 lb), last menstrual period 01/19/2014, unknown if currently breastfeeding.Body mass index is 29.39 kg/(m^2).  General Appearance: Casual and Fairly Groomed  Patent attorneyye Contact::  Fair  Speech:  Pressured  Volume:  Increased  Mood:  Anxious  Affect:  Labile  Thought Process:  Circumstantial  Orientation:  Full (Time, Place, and Person)  Thought Content:  Delusions and Paranoid Ideation  Suicidal Thoughts:  No  Homicidal Thoughts:  No  Memory:  Immediate;   Fair Recent;   Fair Remote;   Fair  Judgement:  Impaired  Insight:  Lacking  Psychomotor Activity:  Increased and Restlessness  Concentration:  Fair  Recall:  FiservFair  Fund of Knowledge:Fair  Language: Good  Akathisia:  No  Handed:  Right  AIMS (if indicated):     Assets:  Communication Skills Desire for Improvement Leisure Time Resilience Social Support  Sleep:  Number of Hours: 5.75   Musculoskeletal: Strength & Muscle Tone: within normal limits Gait & Station: normal Patient leans: N/A  Current Medications: Current Facility-Administered Medications  Medication Dose Route Frequency Provider Last Rate Last Dose  . acetaminophen (TYLENOL) tablet 650 mg  650 mg Oral Q6H PRN Fransisca KaufmannLaura Davis, NP      . alum & mag hydroxide-simeth (MAALOX/MYLANTA) 200-200-20 MG/5ML suspension 30 mL  30 mL Oral Q4H PRN Fransisca KaufmannLaura Davis, NP      . benztropine (COGENTIN) tablet 0.5 mg  0.5 mg Oral BID  Mojeed Akintayo   0.5 mg at 03/10/14 0805  . carbamazepine (TEGRETOL XR) 12 hr tablet 200 mg  200 mg Oral BID Mojeed Akintayo   200 mg at 03/10/14 0805  . haloperidol (HALDOL) tablet 5 mg  5 mg Oral BID Fransisca Kaufmann, NP   5 mg at 03/10/14 0805  . magnesium hydroxide (MILK OF MAGNESIA) suspension 30 mL  30 mL Oral Daily  PRN Fransisca Kaufmann, NP      . OLANZapine zydis (ZYPREXA) disintegrating tablet 10 mg  10 mg Oral Q8H PRN Mojeed Akintayo      . traZODone (DESYREL) tablet 50 mg  50 mg Oral QHS PRN Fransisca Kaufmann, NP   50 mg at 03/07/14 2129    Lab Results:  Results for orders placed during the hospital encounter of 03/04/14 (from the past 48 hour(s))  URINALYSIS, ROUTINE W REFLEX MICROSCOPIC     Status: Abnormal   Collection Time    03/09/14  2:52 PM      Result Value Ref Range   Color, Urine YELLOW  YELLOW   APPearance CLOUDY (*) CLEAR   Specific Gravity, Urine 1.010  1.005 - 1.030   pH 7.0  5.0 - 8.0   Glucose, UA NEGATIVE  NEGATIVE mg/dL   Hgb urine dipstick NEGATIVE  NEGATIVE   Bilirubin Urine NEGATIVE  NEGATIVE   Ketones, ur NEGATIVE  NEGATIVE mg/dL   Protein, ur NEGATIVE  NEGATIVE mg/dL   Urobilinogen, UA 0.2  0.0 - 1.0 mg/dL   Nitrite NEGATIVE  NEGATIVE   Leukocytes, UA MODERATE (*) NEGATIVE   Comment: Performed at [facility]  URINE MICROSCOPIC-ADD ON     Status: Abnormal   Collection Time    03/09/14  2:52 PM      Result Value Ref Range   Squamous Epithelial / LPF MANY (*) RARE   WBC, UA 3-6  <3 WBC/hpf   Bacteria, UA FEW (*) RARE   Comment: Performed at [facility]    Physical Findings: AIMS: Facial and Oral Movements Muscles of Facial Expression: None, normal Lips and Perioral Area: None, normal Jaw: None, normal Tongue: None, normal,Extremity Movements Upper (arms, wrists, hands, fingers): None, normal Lower (legs, knees, ankles, toes): None, normal, Trunk Movements Neck, shoulders, hips: None, normal, Overall Severity Severity of abnormal movements (highest score from questions above): None, normal Incapacitation due to abnormal movements: None, normal Patient's awareness of abnormal movements (rate only patient's report): No Awareness, Dental Status Current problems with teeth and/or dentures?: No Does patient usually wear dentures?: No   CIWA:  CIWA-Ar Total: 1 COWS:  COWS Total Score: 1  Treatment Plan Summary: Daily contact with patient to assess and evaluate symptoms and progress in treatment Medication management  Plan: 1. Continue crisis management and stabilization.  2. Medication management: Reviewed with patient who stated no untoward effects.  -Continue Cogentin 0.5 mg BID for EPS prevention. -Tegretol XR 200 mg BID for improved mood stability. -Continue Haldol to 5 mg BID for psychosis. -Continue Trazodone 50 mg hs prn insomnia.  3. Encouraged patient to attend groups and participate in group counseling sessions and activities.  4. Discharge plan in progress.  5. Continue current treatment plan.  6. Address health issues: Vitals reviewed and stable.  7. Tegretol level on 03/11/14 Medical Decision Making Problem Points:  Established problem, improving (1), Review of last therapy session (1) and Review of psycho-social stressors (1) Data Points:  Order Aims Assessment (2) Review or order clinical lab tests (1)  Review and summation of old records (2) Review of medication regiment & side effects (2)  I certify that inpatient services furnished can reasonably be expected to improve the patient's condition.   Caprice KluverVinay P Kasim Mccorkle, MD 03/10/2014, 2:30 PM

## 2014-03-10 NOTE — Progress Notes (Signed)
Patient ID: Marijo FileBreana Nardone, female   DOB: 1986-09-28, 28 y.o.   MRN: [id number] Psychoeducational Group Note  Date:  03/10/2014 Time:  0920am  Group Topic/Focus:  Making Healthy Choices:   The focus of this group is to help patients identify negative/unhealthy choices they were using prior to admission and identify positive/healthier coping strategies to replace them upon discharge.  Participation Level:  Active  Participation Quality:  Appropriate  Affect:  Appropriate  Cognitive:  Appropriate  Insight:  Supportive  Engagement in Group:  Supportive  Additional Comments:  Healthy support systems.  Valente DavidWeaver, Voncille Simm Brooks 03/10/2014,11:28 AM

## 2014-03-10 NOTE — Progress Notes (Signed)
Patient ID: Marijo FileBreana Medina, female   DOB: October 10, 1986, 28 y.o.   MRN: [id number] Psychoeducational Group Note  Date:  03/10/2014 Time:  0900am  Group Topic/Focus:  Making Healthy Choices:   The focus of this group is to help patients identify negative/unhealthy choices they were using prior to admission and identify positive/healthier coping strategies to replace them upon discharge.  Participation Level:  Active  Participation Quality:  Appropriate  Affect:  Appropriate  Cognitive:  Appropriate  Insight:  Supportive  Engagement in Group:  Supportive  Additional Comments:  Inventory group   Makayla Medina, Makayla Medina 03/10/2014,11:27 AM

## 2014-03-10 NOTE — Progress Notes (Signed)
Patient ID: Makayla Medina, female   DOB: Sep 07, 1986, 28 y.o.   MRN: [id number] D. The patient was resting in bed with her eyes closed all evening. No distress noted. A. Attempted to awaken the patient to attend evening group. R. The patient did not attend evening group. Remained in bed sleeping all evening.

## 2014-03-10 NOTE — Progress Notes (Signed)
Patient ID: Makayla Medina, female   DOB: 07-30-1986, 28 y.o.   MRN: [id number]  D. Patient presents with irritable mood, affect congruent. She reports she is doing well and denies any acute concerns. She states '' I'm just tired, I didn't sleep well last night after I was woken up . '' She has been cooperative with staff , attending unit programming and compliant with medications. She denies any SI/HI / A/V Hallucinations. She remains very focused on her '' research '' and states that her '' mother is her problem, she is going to have to cut off her family '' She denies any other concerns. A. Medications given as ordered. Support and encouragement provided. R. Patient in no acute distress. Will continue to monitor q 15 minutes for safety.

## 2014-03-11 LAB — CARBAMAZEPINE LEVEL, TOTAL: Carbamazepine Lvl: 11 ug/mL (ref 4.0–12.0)

## 2014-03-11 NOTE — Progress Notes (Signed)
D   Pt said she had a bad phone call with her sister and that made her feel down   She at first requested medications so she could go to sleep but after talking she said she realized a pill couldn't fix everything and that she would go to group and then go to bed  She was sad and anxious but still thinks she is ready for discharge tomorrow A   Verbal support given  Allowed pt to vent and talk about her feelings regarding the phone call   Encouraged group attendance   Medications administered and effectiveness monitored   Q 15 min checks R   Pt safe at present

## 2014-03-11 NOTE — Tx Team (Signed)
  Interdisciplinary Treatment Plan Update   Date Reviewed:  03/11/2014  Time Reviewed:  4:38 PM  Progress in Treatment:   Attending groups: Yes Participating in groups: Yes Taking medication as prescribed: Yes  Tolerating medication: Yes Family/Significant other contact made: Yes  Patient understands diagnosis: Yes  Discussing patient identified problems/goals with staff: Yes Medical problems stabilized or resolved: Yes Denies suicidal/homicidal ideation: Yes Patient has not harmed self or others: Yes  For review of initial/current patient goals, please see plan of care.  Estimated Length of Stay:  D/C tomorrow  Reason for Continuation of Hospitalization:   New Problems/Goals identified:  N/A  Discharge Plan or Barriers:   stay with boyfriend, [facility]  Additional Comments:  Attendees:  Signature: Thedore MinsMojeed Akintayo, MD 03/11/2014 4:38 PM   Signature: Richelle Itood Billyjoe Go, LCSW 03/11/2014 4:38 PM  Signature: Fransisca KaufmannLaura Davis, NP 03/11/2014 4:38 PM  Signature: Joslyn Devonaroline Beaudry, RN 03/11/2014 4:38 PM  Signature: Liborio NixonPatrice White, RN 03/11/2014 4:38 PM  Signature:  03/11/2014 4:38 PM  Signature:   03/11/2014 4:38 PM  Signature:    Signature:    Signature:    Signature:    Signature:    Signature:      Scribe for Treatment Team:   Richelle Itood Khamron Gellert, LCSW  03/11/2014 4:38 PM

## 2014-03-11 NOTE — Progress Notes (Signed)
Patient ID: Makayla Medina, female   DOB: 1986/10/30, 28 y.o.   MRN: [id number] [facility] MD Progress Note  03/11/2014 10:19 AM Beda Dula  MRN:  [id number] Subjective:   Patient states "I do not want to have any relationship with my mother when I get out of here. But my medications are helping me, I will make sure I take them at home.''  Objective:  Patient is seen and her chart is reviewed. Patient reports that she has been doing much better, sleeping well and has good appetite. She  endorsed decreased agitation, mood swings, depression and delusional symptoms. She denies suicidal or homicidal ideations, intent or plan. Patient compliant with her current medications and unit milieu. She is planning to live with her boyfriend upon discharge. Tegretol level is 11 Diagnosis:   DSM5: Total Time spent with patient: 20 minutes Schizophrenia Disorders:  Obsessive-Compulsive Disorders:  Trauma-Stressor Disorders:  Substance/Addictive Disorders: Cannabis Use Disorder - Moderate 9304.30)  Depressive Disorders:  AXIS I: Schizoaffective disorder-bipolar type  AXIS II: Deferred  AXIS III:  Past Medical History   Diagnosis  Date   .  Vaginal Pap smear, abnormal     AXIS IV: economic problems, other psychosocial or environmental problems and problems with primary support group  AXIS V: 41-50 Serious Symptoms   ADL's:  Intact  Sleep: Fair  Appetite:  Fair  Suicidal Ideation:  Denies Homicidal Ideation:  Denies AEB (as evidenced by):  Psychiatric Specialty Exam: Physical Exam  Psychiatric: Her speech is normal and behavior is normal. Her mood appears anxious. Thought content is paranoid. Cognition and memory are normal. She expresses impulsivity.    Review of Systems  Constitutional: Negative.   HENT: Negative.   Eyes: Negative.   Respiratory: Negative.   Cardiovascular: Negative.   Gastrointestinal: Negative.   Genitourinary: Negative.   Musculoskeletal: Negative.   Skin:  Negative.   Neurological: Negative.   Endo/Heme/Allergies: Negative.   Psychiatric/Behavioral: Negative for suicidal ideas, hallucinations and memory loss. The patient is nervous/anxious.     Blood pressure 104/72, pulse 75, temperature 98.1 F (36.7 C), temperature source Oral, resp. rate 18, height 5\' 6"  (1.676 m), weight 82.555 kg (182 lb), last menstrual period 01/19/2014, unknown if currently breastfeeding.Body mass index is 29.39 kg/(m^2).  General Appearance: Casual and Fairly Groomed  Patent attorney::  Fair  Speech:  Pressured  Volume:  normal  Mood:  Anxious  Affect:  bright  Thought Process:  Circumstantial  Orientation:  Full (Time, Place, and Person)  Thought Content:  Delusions and Paranoid Ideation  Suicidal Thoughts:  No  Homicidal Thoughts:  No  Memory:  Immediate;   Fair Recent;   Fair Remote;   Fair  Judgement:  marginal  Insight:  Lacking  Psychomotor Activity:  Increased   Concentration:  Fair  Recall:  [employer] of Knowledge:Fair  Language: Good  Akathisia:  No  Handed:  Right  AIMS (if indicated):     Assets:  Communication Skills Desire for Improvement Leisure Time Resilience Social Support  Sleep:  Number of Hours: 6   Musculoskeletal: Strength & Muscle Tone: within normal limits Gait & Station: normal Patient leans: N/A  Current Medications: Current Facility-Administered Medications  Medication Dose Route Frequency Provider Last Rate Last Dose  . acetaminophen (TYLENOL) tablet 650 mg  650 mg Oral Q6H PRN Fransisca Kaufmann, NP      . alum & mag hydroxide-simeth (MAALOX/MYLANTA) 200-200-20 MG/5ML suspension 30 mL  30 mL Oral Q4H PRN Fransisca Kaufmann, NP      .  benztropine (COGENTIN) tablet 0.5 mg  0.5 mg Oral BID Marquett Bertoli   0.5 mg at 03/11/14 0745  . carbamazepine (TEGRETOL XR) 12 hr tablet 200 mg  200 mg Oral BID Kalesha Irving   200 mg at 03/11/14 0745  . haloperidol (HALDOL) tablet 5 mg  5 mg Oral BID Fransisca KaufmannLaura Davis, NP   5 mg at 03/11/14 0745  .  magnesium hydroxide (MILK OF MAGNESIA) suspension 30 mL  30 mL Oral Daily PRN Fransisca KaufmannLaura Davis, NP      . OLANZapine zydis (ZYPREXA) disintegrating tablet 10 mg  10 mg Oral Q8H PRN Anahlia Iseminger      . traZODone (DESYREL) tablet 50 mg  50 mg Oral QHS PRN Fransisca KaufmannLaura Davis, NP   50 mg at 03/07/14 2129    Lab Results:  Results for orders placed during the hospital encounter of 03/04/14 (from the past 48 hour(s))  URINALYSIS, ROUTINE W REFLEX MICROSCOPIC     Status: Abnormal   Collection Time    03/09/14  2:52 PM      Result Value Ref Range   Color, Urine YELLOW  YELLOW   APPearance CLOUDY (*) CLEAR   Specific Gravity, Urine 1.010  1.005 - 1.030   pH 7.0  5.0 - 8.0   Glucose, UA NEGATIVE  NEGATIVE mg/dL   Hgb urine dipstick NEGATIVE  NEGATIVE   Bilirubin Urine NEGATIVE  NEGATIVE   Ketones, ur NEGATIVE  NEGATIVE mg/dL   Protein, ur NEGATIVE  NEGATIVE mg/dL   Urobilinogen, UA 0.2  0.0 - 1.0 mg/dL   Nitrite NEGATIVE  NEGATIVE   Leukocytes, UA MODERATE (*) NEGATIVE   Comment[facility]  URINE MICROSCOPIC-ADD ON     Status: Abnormal   Collection Time    03/09/14  2:52 PM      Result Value Ref Range   Squamous Epithelial / LPF MANY (*) RARE   WBC, UA 3-6  <3 WBC/hpf   Bacteria, UA FEW (*) RARE   Comment: [facility]  CARBAMAZEPINE LEVEL, TOTAL     Status: None   Collection Time    03/11/14  6:20 AM      Result Value Ref Range   Carbamazepine Lvl 11.0  4.0 - 12.0 ug/mL   Comment: Performed at [facility]    Physical Findings: AIMS: Facial and Oral Movements Muscles of Facial Expression: None, normal Lips and Perioral Area: None, normal Jaw: None, normal Tongue: None, normal,Extremity Movements Upper (arms, wrists, hands, fingers): None, normal Lower (legs, knees, ankles, toes): None, normal, Trunk Movements Neck, shoulders, hips: None, normal, Overall Severity Severity of abnormal movements (highest score from  questions above): None, normal Incapacitation due to abnormal movements: None, normal Patient's awareness of abnormal movements (rate only patient's report): No Awareness, Dental Status Current problems with teeth and/or dentures?: No Does patient usually wear dentures?: No  CIWA:  CIWA-Ar Total: 1 COWS:  COWS Total Score: 1  Treatment Plan Summary: Daily contact with patient to assess and evaluate symptoms and progress in treatment Medication management  Plan: 1. Continue crisis management and stabilization.  2. Medication management: Reviewed with patient who stated no untoward effects.  -Continue Cogentin 0.5 mg BID for EPS prevention. -Continue Tegretol XR 200 mg BID for improved mood stability. -Continue Haldol to 5 mg BID for psychosis. -Continue Trazodone 50 mg hs prn insomnia.  3. Encouraged patient to attend groups and participate in group counseling sessions and activities.  4. Discharge plan  in progress.  5. Continue current treatment plan.  6. Address health issues: Vitals reviewed and stable.   Medical Decision Making Problem Points:  Established problem, improving (1), Review of last therapy session (1) and Review of psycho-social stressors (1) Data Points:  Order Aims Assessment (2) Review or order clinical lab tests (1) Review and summation of old records (2) Review of medication regiment & side effects (2)  I certify that inpatient services furnished can reasonably be expected to improve the patient's condition.   Thedore MinsMojeed Imraan Wendell, MD 03/11/2014, 10:19 AM

## 2014-03-11 NOTE — Progress Notes (Signed)
D: Pt presents with flat affect and depressed mood. Pt denies feeling depressed today. Pt stated that she is trying to workout things with DSS in regards to her child because her mother called DSS on her. Per pt, she have decided to stay away from her mother because she recognizes her mother as a stressor and she do not want to end up back in the hosp. Pt compliant with taking her meds and attending groups. No adverse reaction to meds verbalized by pt at this time. A: Medications administered as ordered per MD. Verbal support given. Pt encouraged to attend groups. 15 minute checks performed for safety. R: Pt safety maintained at this time.

## 2014-03-11 NOTE — Progress Notes (Signed)
The focus of this group is to help patients review their daily goal of treatment and discuss progress on daily workbooks. Pt attended the evening group session but responded minimally to discussion prompts from the Writer. Pt shared that today was a good day on the unit, the highlight of which was her pending discharge tomorrow. "I am more than ready to go." Pt reported having no additional needs from Nursing Staff this evening beyond laundry, which was taken care of following group. Pt's affect was slightly agitated, likely due to a bad phone call that occurred just prior to group.

## 2014-03-11 NOTE — [facility] Group Notes (Signed)
[facility]   03/11/2014 11:21 AM  Participation Quality:  Engaged  Mood/Affect:  Blunted  Depression Rating:  denies  Anxiety Rating:  denies  Thoughts of Suicide:  No Will you contract for safety?   NA  Current AVH:  No  Plan for Discharge/Comments:  States she is ready to go home.  Asks for multiple letters-DSS, school, court.  [facility].  Says boyfriend will pick her up at d/c.  Asked me about some legal term that was unknown to me, but other patients answered her question.    Transportation Means: boyfriend  Supports: family  Ida RogueRodney B Viral Schramm

## 2014-03-11 NOTE — [facility] Group Notes (Signed)
[facility] LCSW Group Therapy  03/11/2014 1:15 pm  Type of Therapy: Process Group Therapy  Participation Level:  Active  Participation Quality:  Appropriate  Affect:  Flat  Cognitive:  Oriented  Insight:  Improving  Engagement in Group:  Limited  Engagement in Therapy:  Limited  Modes of Intervention:  Activity, Clarification, Education, Problem-solving and Support  Summary of Progress/Problems: Today's group addressed the issue of overcoming obstacles.  Patients were asked to identify their biggest obstacle post d/c that stands in the way of their on-going success, and then problem solve as to how to manage this.  Makayla Medina aired her Dealerdirty laundry with her mother immediately, and despite getting much feedback from others about "turning the other cheek, just walking away and killing with kindness,"  Makayla Medina was adamant that "she called me names, and I cannot respect her."  She also believes something traumatic happened to her when she was a child, "and my mother refuses to acknowledge that and give me details."  Makayla Medina, Makayla Medina 03/11/2014   4:44 PM

## 2014-03-12 DIAGNOSIS — F1210 Cannabis abuse, uncomplicated: Secondary | ICD-10-CM

## 2014-03-12 MED ORDER — CARBAMAZEPINE ER 200 MG PO TB12: 200.0000 mg | ORAL_TABLET | Freq: Two times a day (BID) | ORAL | Status: DC

## 2014-03-12 MED ORDER — TRAZODONE HCL 50 MG PO TABS: 50.0000 mg | ORAL_TABLET | Freq: Every evening | ORAL | Status: DC | PRN

## 2014-03-12 MED ORDER — HALOPERIDOL 5 MG PO TABS: 5.0000 mg | ORAL_TABLET | Freq: Two times a day (BID) | ORAL | Status: DC

## 2014-03-12 MED ORDER — BENZTROPINE MESYLATE 0.5 MG PO TABS: 0.5000 mg | ORAL_TABLET | Freq: Two times a day (BID) | ORAL | Status: DC

## 2014-03-12 NOTE — [facility] Group Notes (Signed)
[facility] Group Notes:  (Nursing/MHT/Case Management/Adjunct)  Date:  03/12/2014  Time:  9:54 AM  Type of Therapy:  Psychoeducational Skills  Participation Level:  Active  Participation Quality:  Appropriate  Affect:  Appropriate  Cognitive:  Alert and Appropriate  Insight:  Appropriate  Engagement in Group:  Engaged  Modes of Intervention:  Clarification and Problem-solving  Summary of Progress/Problems: Wellness group; Participated well; good insight  Helyn NumbersJennifer Hundley Dilcia Rybarczyk 03/12/2014, 9:54 AM
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
## 2014-03-12 NOTE — Progress Notes (Signed)
Discharge note: Pt denies SI/HI/AVH at time of discharge. Pt received both written and verbal discharge instructions. Pt agreed to f/u appt and med regimen. Pt received sample meds, prescriptions, hospitalization letters and belongings from room. Pt safely left [facility].

## 2014-03-12 NOTE — [facility] Suicide Risk Assessment (Signed)
   Demographic Factors:  Adolescent or young adult, Low socioeconomic status, Unemployed and female  Total Time spent with patient: 20 minutes  Psychiatric Specialty Exam: Physical Exam  Psychiatric: She has a normal mood and affect. Her speech is normal and behavior is normal. Judgment and thought content normal. Cognition and memory are normal.    Review of Systems  Constitutional: Negative.   HENT: Negative.   Eyes: Negative.   Respiratory: Negative.   Cardiovascular: Negative.   Gastrointestinal: Negative.   Genitourinary: Negative.   Musculoskeletal: Negative.   Skin: Negative.   Neurological: Negative.   Endo/Heme/Allergies: Negative.   Psychiatric/Behavioral: Negative.     Blood pressure 106/61, pulse 71, temperature 97.8 F (36.6 C), temperature source Oral, resp. rate 20, height 5\' 6"  (1.676 m), weight 82.555 kg (182 lb), last menstrual period 01/19/2014, unknown if currently breastfeeding.Body mass index is 29.39 kg/(m^2).  General Appearance: Fairly Groomed  Patent attorneyye Contact::  Good  Speech:  Clear and Coherent and Normal Rate  Volume:  Normal  Mood:  Euthymic  Affect:  Appropriate  Thought Process:  Goal Directed  Orientation:  Full (Time, Place, and Person)  Thought Content:  Negative  Suicidal Thoughts:  No  Homicidal Thoughts:  No  Memory:  Immediate;   Fair Recent;   Fair Remote;   Fair  Judgement:  Good  Insight:  Good  Psychomotor Activity:  Normal  Concentration:  Negative  Recall:  Fair  Fund of Knowledge:Fair  Language: Fair  Akathisia:  No  Handed:  Right  AIMS (if indicated):     Assets:  Communication Skills Desire for Improvement Physical Health  Sleep:  Number of Hours: 5.5    Musculoskeletal: Strength & Muscle Tone: within normal limits Gait & Station: normal Patient leans: N/A   Mental Status Per Nursing Assessment::   On Admission:     Current Mental Status by Physician: patient denies suicidal ideation, intent or plan  Loss  Factors: Financial problems/change in socioeconomic status  Historical Factors: Impulsivity  Risk Reduction Factors:   Sense of responsibility to family and Religious beliefs about death  Continued Clinical Symptoms:  Alcohol/Substance Abuse/Dependencies  Cognitive Features That Contribute To Risk:  Closed-mindedness    Suicide Risk:  Minimal: No identifiable suicidal ideation.  Patients presenting with no risk factors but with morbid ruminations; may be classified as minimal risk based on the severity of the depressive symptoms  Discharge Diagnoses:   AXIS I:  Schizoaffective disorder              Cannabis use disorder AXIS II:  Cluster B Traits AXIS III:   Past Medical History  Diagnosis Date  . Vaginal Pap smear, abnormal    AXIS IV:  other psychosocial or environmental problems and problems related to social environment AXIS V:  61-70 mild symptoms  Plan Of Care/Follow-up recommendations:  Activity:  as tolerated Diet:  healthy Tests:  Tegretol level: 11 Other:  patient to keep her after care appointment  Is patient on multiple antipsychotic therapies at discharge:  No   Has Patient had three or more failed trials of antipsychotic monotherapy by history:  No  Recommended Plan for Multiple Antipsychotic Therapies: NA    Thedore MinsMojeed Etan Vasudevan, MD 03/12/2014, 10:22 AM

## 2014-03-12 NOTE — Progress Notes (Signed)
[facility] :  Will you be returning to the same living situation after discharge: No. At discharge, do you have transportation home?:Yes,  boyfriend Do you have the ability to pay for your medications:Yes,  mental health  Release of information consent forms completed and in the chart;  Patient's signature needed at discharge.  Patient to Follow up at: Follow-up Information   Follow up with [facility]. (Go to the walk-in clinic M-F between 8 and 9AM for your hospital follow up appointment)    Contact information:   [address]  [place]  [phone]      Patient denies SI/HI:   Yes,  yes    Safety Planning and Suicide Prevention discussed:  Yes,  yes  Ida RogueRodney B Stony Stegmann 03/12/2014, 12:37 PM

## 2014-03-15 NOTE — Progress Notes (Signed)
Patient Discharge Instructions:  After Visit Summary (AVS):   Faxed to:  03/15/14 Discharge Summary Note:   Faxed to:  03/15/14 Psychiatric Admission Assessment Note:   Faxed to:  03/15/14 Suicide Risk Assessment - Discharge Assessment:   Faxed to:  03/15/14 Faxed/Sent to the Next Level Care provider:  03/15/14 [facility] @ [phone]  Jerelene ReddenSheena E Timpson, 03/15/2014, 2:59 PM

## 2014-09-23 ENCOUNTER — Encounter (HOSPITAL_COMMUNITY): Payer: Self-pay | Admitting: *Deleted

## 2014-10-10 ENCOUNTER — Encounter (HOSPITAL_COMMUNITY): Payer: Self-pay | Admitting: Emergency Medicine

## 2014-10-10 ENCOUNTER — Emergency Department (HOSPITAL_COMMUNITY)
Admission: EM | Admit: 2014-10-10 | Discharge: 2014-10-10 | Disposition: A | Discharge status: Home / Self Care | Payer: Medicaid Other | Attending: Emergency Medicine | Admitting: Emergency Medicine

## 2014-10-10 DIAGNOSIS — F3112 Bipolar disorder, current episode manic without psychotic features, moderate: Secondary | ICD-10-CM | POA: Diagnosis not present

## 2014-10-10 DIAGNOSIS — F419 Anxiety disorder, unspecified: Secondary | ICD-10-CM | POA: Diagnosis not present

## 2014-10-10 DIAGNOSIS — F1210 Cannabis abuse, uncomplicated: Secondary | ICD-10-CM | POA: Insufficient documentation

## 2014-10-10 DIAGNOSIS — F1410 Cocaine abuse, uncomplicated: Secondary | ICD-10-CM | POA: Diagnosis not present

## 2014-10-10 DIAGNOSIS — Z8619 Personal history of other infectious and parasitic diseases: Secondary | ICD-10-CM | POA: Diagnosis not present

## 2014-10-10 DIAGNOSIS — F309 Manic episode, unspecified: Secondary | ICD-10-CM | POA: Diagnosis present

## 2014-10-10 DIAGNOSIS — Z79899 Other long term (current) drug therapy: Secondary | ICD-10-CM | POA: Insufficient documentation

## 2014-10-10 DIAGNOSIS — Z3202 Encounter for pregnancy test, result negative: Secondary | ICD-10-CM | POA: Diagnosis not present

## 2014-10-10 LAB — BASIC METABOLIC PANEL
Anion gap: 11 (ref 5–15)
BUN: 9 mg/dL (ref 6–23)
CALCIUM: 9.6 mg/dL (ref 8.4–10.5)
CO2: 25 mEq/L (ref 19–32)
Chloride: 100 mEq/L (ref 96–112)
Creatinine, Ser: 0.84 mg/dL (ref 0.50–1.10)
GFR calc Af Amer: >90 mL/min (ref >90)
GFR calc non Af Amer: >90 mL/min (ref >90)
GLUCOSE: 125 mg/dL — AB (ref 70–99)
POTASSIUM: 3.4 meq/L — AB (ref 3.7–5.3)
Sodium: 136 mEq/L — ABNORMAL LOW (ref 137–147)

## 2014-10-10 LAB — URINE MICROSCOPIC-ADD ON

## 2014-10-10 LAB — RAPID URINE DRUG SCREEN, HOSP PERFORMED
Amphetamines: NOT DETECTED
BARBITURATES: NOT DETECTED
Benzodiazepines: NOT DETECTED
Cocaine: POSITIVE — AB
OPIATES: NOT DETECTED
TETRAHYDROCANNABINOL: POSITIVE — AB

## 2014-10-10 LAB — CBC WITH DIFFERENTIAL/PLATELET
BASOS PCT: 0 % (ref 0–1)
Basophils Absolute: 0 10*3/uL (ref 0.0–0.1)
Eosinophils Absolute: 0.2 10*3/uL (ref 0.0–0.7)
Eosinophils Relative: 3 % (ref 0–5)
HCT: 39.8 % (ref 36.0–46.0)
Hemoglobin: 13.8 g/dL (ref 12.0–15.0)
LYMPHS ABS: 1.2 10*3/uL (ref 0.7–4.0)
Lymphocytes Relative: 24 % (ref 12–46)
MCH: 30.5 pg (ref 26.0–34.0)
MCHC: 34.7 g/dL (ref 30.0–36.0)
MCV: 87.9 fL (ref 78.0–100.0)
Monocytes Absolute: 0.7 10*3/uL (ref 0.1–1.0)
Monocytes Relative: 14 % — ABNORMAL HIGH (ref 3–12)
Neutro Abs: 3 10*3/uL (ref 1.7–7.7)
Neutrophils Relative %: 59 % (ref 43–77)
Platelets: 159 10*3/uL (ref 150–400)
RBC: 4.53 MIL/uL (ref 3.87–5.11)
RDW: 13 % (ref 11.5–15.5)
WBC: 5 10*3/uL (ref 4.0–10.5)

## 2014-10-10 LAB — URINALYSIS, ROUTINE W REFLEX MICROSCOPIC
Glucose, UA: NEGATIVE mg/dL
HGB URINE DIPSTICK: NEGATIVE
Ketones, ur: NEGATIVE mg/dL
NITRITE: NEGATIVE
Protein, ur: 30 mg/dL — AB
SPECIFIC GRAVITY, URINE: 1.029 (ref 1.005–1.030)
Urobilinogen, UA: 1 mg/dL (ref 0.0–1.0)
pH: 7 (ref 5.0–8.0)

## 2014-10-10 LAB — POC URINE PREG, ED: PREG TEST UR: NEGATIVE

## 2014-10-10 LAB — CARBAMAZEPINE LEVEL, TOTAL: Carbamazepine Lvl: 0.7 ug/mL — ABNORMAL LOW (ref 4.0–12.0)

## 2014-10-10 MED ORDER — HALOPERIDOL 5 MG PO TABS: 5.0000 mg | ORAL_TABLET | Freq: Two times a day (BID) | ORAL | Status: DC

## 2014-10-10 MED ORDER — LORAZEPAM 1 MG PO TABS: 1.0000 mg | ORAL_TABLET | Freq: Three times a day (TID) | ORAL | Status: DC | PRN

## 2014-10-10 MED ORDER — ZOLPIDEM TARTRATE 5 MG PO TABS: 5.0000 mg | ORAL_TABLET | Freq: Every evening | ORAL | Status: DC | PRN

## 2014-10-10 MED ORDER — CARBAMAZEPINE ER 200 MG PO TB12
200.0000 mg | ORAL_TABLET | Freq: Two times a day (BID) | ORAL | Status: DC
  Filled 2014-10-10 (×2): qty 1

## 2014-10-10 MED ORDER — BENZTROPINE MESYLATE 1 MG PO TABS: 0.5000 mg | ORAL_TABLET | Freq: Two times a day (BID) | ORAL | Status: DC

## 2014-10-10 MED ORDER — TRAZODONE HCL 50 MG PO TABS: 50.0000 mg | ORAL_TABLET | Freq: Every evening | ORAL | Status: DC | PRN

## 2014-10-10 MED ORDER — ONDANSETRON HCL 4 MG PO TABS: 4.0000 mg | ORAL_TABLET | Freq: Three times a day (TID) | ORAL | Status: DC | PRN

## 2014-10-10 NOTE — BH Assessment (Signed)
Assessment Note  Makayla Medina is an 28 y.o. female with hx of Anxiety and Bipolar Disorder. Patient presents to [facility] with her mother. Sts, "I am only here to appease my family nothing more". Patient's mother explains that she is concerned about patient's well being. She explains that patient is hyper religous and agitated. Mom explains that patient displays risky behaviors as well evidenced by lending her car out to strangers. Mom reports that patient spends her money on useless items. Additionally, patient speaks of participating in 2 million dollar projects. The biggest concern for mom was patient making comments about booking a trip to [place]. Mom sts that patient has no reason or money for a trip to [place]. Patient's overall behaviors are prescribed as irrational and spontaneous. Patient has a hx of similar behaviors and mom sts that she is noticing familiar signs of a "manic/psychotic break".  Patient denies SI, HI, and AVH's. Patient does report a hx of 1 prior suicide attempt (age 33). Patient asked about alcohol and drug use. She responds, "You can ask the doctor about that information or look at my blood work". Patient refused to elaborate any further about alcohol and drug use. UDS + for Cocaine and THC.    Patient's outpatient psychiatrist is with [facility]. She was recently given a Invega Injection (3 days ago). Patient will follow up with her psychiatrist today 10/10/2014 at 1630 and again Monday 10/14/2014.    Axis I: Bipolar Disorder NOS, Cocaine Abuse, Cannabis Abuse Axis II: Deferred Axis III:  Past Medical History  Diagnosis Date  . Anxiety   . Bipolar disorder   . Vaginal Pap smear, abnormal   . Gonorrhea   . Chlamydia    Axis IV: other psychosocial or environmental problems, problems related to social environment, problems with access to health care services and problems with primary support group Axis V: 51-60 moderate symptoms  Past Medical  History:  Past Medical History  Diagnosis Date  . Anxiety   . Bipolar disorder   . Vaginal Pap smear, abnormal   . Gonorrhea   . Chlamydia     Past Surgical History  Procedure Laterality Date  . Dilation and curettage of uterus    . Colposcopy      Family History: No family history on file.  Social History:  reports that she has never smoked. She does not have any smokeless tobacco history on file. She reports that she drinks about 0.6 oz of alcohol per week. She reports that she uses illicit drugs (Marijuana).  Additional Social History:     CIWA: CIWA-Ar BP: 110/63 mmHg Pulse Rate: 73 COWS:    Allergies: No Known Allergies  Home Medications:  (Not in a hospital admission)  OB/GYN Status:  Patient's last menstrual period was 09/26/2014 (approximate).  General Assessment Data Location of Assessment: [facility] ED Is this an Initial Assessment or a Re-assessment for this encounter?: Initial Assessment Living Arrangements: Other (Comment), Parent, Other relatives (lives with daughter (35 yrs old), boyfriend, boyfriends fathe) Can pt return to current living arrangement?: Yes Admission Status: Voluntary Is patient capable of signing voluntary admission?: Yes Transfer from: Acute Hospital Referral Source: Self/Family/Friend     [facility] Crisis Care Plan Living Arrangements: Other (Comment), Parent, Other relatives (lives with daughter (35 yrs old), boyfriend, boyfriends fathe) Name of Psychiatrist:  (Dr. Heading-[facility] ) Name of Therapist:  ([facility])  Education Status Is patient currently in school?: No  Risk to self with the past 6  months Suicidal Ideation: No Suicidal Intent: No Is patient at risk for suicide?: No Suicidal Plan?: No Access to Means: No What has been your use of drugs/alcohol within the last 12 months?:  (n/a) Previous Attempts/Gestures: Yes How many times?:  (1x ) Other Self Harm Risks:  (none reported  ) Triggers for Past Attempts: Other (Comment) (patient does not provide details) Intentional Self Injurious Behavior: None Family Suicide History: Unknown Recent stressful life event(s): Other (Comment) ("Mom bringing me to the Emergency Department") Persecutory voices/beliefs?: No Depression: Yes Depression Symptoms: Feeling angry/irritable, Feeling worthless/self pity, Loss of interest in usual pleasures, Guilt, Fatigue, Isolating, Tearfulness, Insomnia, Despondent Substance abuse history and/or treatment for substance abuse?: No Suicide prevention information given to non-admitted patients: Not applicable  Risk to Others within the past 6 months Homicidal Ideation: No Thoughts of Harm to Others: No Current Homicidal Intent: No Current Homicidal Plan: No Access to Homicidal Means: No Identified Victim:  (n/a) History of harm to others?: No Assessment of Violence: None Noted Violent Behavior Description:  (patient calm and cooperative ) Does patient have access to weapons?: No Criminal Charges Pending?: No Does patient have a court date: No  Psychosis Hallucinations: None noted Delusions: None noted  Mental Status Report Appear/Hygiene: Disheveled Eye Contact: Good Motor Activity: Freedom of movement Speech: Logical/coherent Level of Consciousness: Alert Mood: Depressed Affect: Irritable Anxiety Level: Minimal Thought Processes: Coherent, Relevant Judgement: Partial Orientation: Person, Place, Time, Situation Obsessive Compulsive Thoughts/Behaviors: None  Cognitive Functioning Concentration: Decreased Memory: Recent Intact, Remote Intact IQ: Average Insight: Fair Impulse Control: Poor Appetite: Poor Weight Loss:  (none reported ) Weight Gain:  (none reported ) Sleep: No Change Total Hours of Sleep:  (varies ) Vegetative Symptoms: None  ADLScreening [facility] Assessment Services) Patient's cognitive ability adequate to safely complete daily activities?:  Yes Patient able to express need for assistance with ADLs?: Yes Independently performs ADLs?: Yes (appropriate for developmental age)  Prior Inpatient Therapy Prior Inpatient Therapy: Yes Prior Therapy Dates:  (patient unable to recall ) Prior Therapy Facilty/Provider(s):  (patient unable to recall ) Reason for Treatment:  (manic episode )  Prior Outpatient Therapy Prior Outpatient Therapy: Yes Prior Therapy Dates:  (current) Prior Therapy Facilty/Provider(s):  ([facility]) Reason for Treatment:  (medication managment )  ADL Screening (condition at time of admission) Patient's cognitive ability adequate to safely complete daily activities?: Yes Is the patient deaf or have difficulty hearing?: No Does the patient have difficulty seeing, even when wearing glasses/contacts?: No Does the patient have difficulty concentrating, remembering, or making decisions?: No Patient able to express need for assistance with ADLs?: Yes Does the patient have difficulty dressing or bathing?: No Independently performs ADLs?: Yes (appropriate for developmental age) Does the patient have difficulty walking or climbing stairs?: No Weakness of Legs: None Weakness of Arms/Hands: None  Home Assistive Devices/Equipment Home Assistive Devices/Equipment: None    Abuse/Neglect Assessment (Assessment to be complete while patient is alone) Physical Abuse: Denies Verbal Abuse: Denies Sexual Abuse: Denies Exploitation of patient/patient's resources: Denies Self-Neglect: Denies Values / Beliefs Cultural Requests During Hospitalization: None Spiritual Requests During Hospitalization: None   Advance Directives (For Healthcare) Does patient have an advance directive?: No Would patient like information on creating an advanced directive?: No - patient declined information Nutrition Screen- [facility] Patient's home diet: Regular  Additional Information 1:1 In Past 12 Months?:  No CIRT Risk: No Elopement Risk: No Does patient have medical clearance?: Yes     Disposition:  Disposition Initial Assessment Completed  for this Encounter: Yes Disposition of Patient: Outpatient treatment, Referred to, Other dispositions ([facility] appt 10/10/14 4:30pm ) Type of outpatient treatment: Adult (Follow up with Dr. Omelia BlackwaterHeaden 10/10/2014 [facility]) Other disposition(s): Other (Comment) (Discharge per Dr. Shyrl NumbersNanavanti ) Patient referred to: Other (Comment) (EDP-Dr. Shyrl NumbersNanavanti will discharge to outpt provider )  On Site Evaluation by:   Reviewed with Physician:    Melynda RipplePerry, Chikita Dogan [facility] 10/10/2014 3:07 PM

## 2014-10-10 NOTE — Discharge Instructions (Signed)
We saw you in the ER for your mental health concerns and had our behavioral health team evaluate you. The team feels comfortable sending you home, please follow the recommendations given to you by them and the follow-up and medications they have prescribed to you. Please refrain from substance abuse. Return to the ER if your symptoms worsen.   Bipolar Disorder Bipolar disorder is a mental illness. The term bipolar disorder actually is used to describe a group of disorders that all share varying degrees of emotional highs and lows that can interfere with daily functioning, such as work, school, or relationships. Bipolar disorder also can lead to drug abuse, hospitalization, and suicide. The emotional highs of bipolar disorder are periods of elation or irritability and high energy. These highs can range from a mild form (hypomania) to a severe form (mania). People experiencing episodes of hypomania may appear energetic, excitable, and highly productive. People experiencing mania may behave impulsively or erratically. They often make poor decisions. They may have difficulty sleeping. The most severe episodes of mania can involve having very distorted beliefs or perceptions about the world and seeing or hearing things that are not real (psychotic delusions and hallucinations).  The emotional lows of bipolar disorder (depression) also can range from mild to severe. Severe episodes of bipolar depression can involve psychotic delusions and hallucinations. Sometimes people with bipolar disorder experience a state of mixed mood. Symptoms of hypomania or mania and depression are both present during this mixed-mood episode. SIGNS AND SYMPTOMS There are signs and symptoms of the episodes of hypomania and mania as well as the episodes of depression. The signs and symptoms of hypomania and mania are similar but vary in severity. They include:  Inflated self-esteem or feeling of increased  self-confidence.  Decreased need for sleep.  Unusual talkativeness (rapid or pressured speech) or the feeling of a need to keep talking.  Sensation of racing thoughts or constant talking, with quick shifts between topics that may or may not be related (flight of ideas).  Decreased ability to focus or concentrate.  Increased purposeful activity, such as work, studies, or social activity, or nonproductive activity, such as pacing, squirming and fidgeting, or finger and toe tapping.  Impulsive behavior and use of poor judgment, resulting in high-risk activities, such as having unprotected sex or spending excessive amounts of money. Signs and symptoms of depression include the following:   Feelings of sadness, hopelessness, or helplessness.  Frequent or uncontrollable episodes of crying.  Lack of feeling anything or caring about anything.  Difficulty sleeping or sleeping too much.  Inability to enjoy the things you used to enjoy.   Desire to be alone all the time.   Feelings of guilt or worthlessness.  Lack of energy or motivation.   Difficulty concentrating, remembering, or making decisions.  Change in appetite or weight beyond normal fluctuations.  Thoughts of death or the desire to harm yourself. DIAGNOSIS  Bipolar disorder is diagnosed through an assessment by your caregiver. Your caregiver will ask questions about your emotional episodes. There are two main types of bipolar disorder. People with type I bipolar disorder have manic episodes with or without depressive episodes. People with type II bipolar disorder have hypomanic episodes and major depressive episodes, which are more serious than mild depression. The type of bipolar disorder you have can make an important difference in how your illness is monitored and treated. Your caregiver may ask questions about your medical history and use of alcohol or drugs, including prescription medication. Certain  medical conditions  and substances also can cause emotional highs and lows that resemble bipolar disorder (secondary bipolar disorder).  TREATMENT  Bipolar disorder is a long-term illness. It is best controlled with continuous treatment rather than treatment only when symptoms occur. The following treatments can be prescribed for bipolar disorders:  Medication--Medication can be prescribed by a doctor that is an expert in treating mental disorders (psychiatrists). Medications called mood stabilizers are usually prescribed to help control the illness. Other medications are sometimes added if symptoms of mania, depression, or psychotic delusions and hallucinations occur despite the use of a mood stabilizer.  Talk therapy--Some forms of talk therapy are helpful in providing support, education, and guidance. A combination of medication and talk therapy is best for managing the disorder over time. A procedure in which electricity is applied to your brain through your scalp (electroconvulsive therapy) is used in cases of severe mania when medication and talk therapy do not work or work too slowly. Document Released: 02/14/2001 Document Revised: 03/05/2013 Document Reviewed: 12/04/2012 [facility] Patient Information 2015 [employer] [place]. This information is not intended to replace advice given to you by your health care provider. Make sure you discuss any questions you have with your health care provider.

## 2014-10-10 NOTE — BH Assessment (Signed)
Per EDP- Dr. Shyrl NumbersNanavanti discharge home with follow up to patient's outpatient provider ([facility]) #[phone]. Patient has a follow up appointment today @ 1630 with psychiatrist Dr. Penelope GalasHeading.

## 2014-10-10 NOTE — ED Provider Notes (Addendum)
CSN: [id number]     Arrival date & time 10/10/14  0906 History   First MD Initiated Contact with Patient 10/10/14 [phone]     Chief Complaint  Patient presents with  . Manic Behavior     (Consider location/radiation/quality/duration/timing/severity/associated sxs/prior Treatment) HPI Comments: Pt comes in with cc of bipolar disorder evaluation. Brought in by mother and boyfriend, who allege, that pt has been missing her meds, and engaiging in manic behavior. Pt is hyperactive, not sleeping well, and always on the run. She has been doing things outside of character - like lending her cars to total strangers. PT also has been drinking more heavily, and mixes cocaine with it. Finally, pt has been delusional, and commenting on 20 million dollar plans of building embassy.   The history is provided by the patient.    Past Medical History  Diagnosis Date  . Anxiety   . Bipolar disorder   . Vaginal Pap smear, abnormal   . Gonorrhea   . Chlamydia    Past Surgical History  Procedure Laterality Date  . Dilation and curettage of uterus    . Colposcopy     No family history on file. History  Substance Use Topics  . Smoking status: Never Smoker   . Smokeless tobacco: Not on file  . Alcohol Use: 0.6 oz/week    1 Glasses of wine per week     Comment: socially   OB History    Gravida Para Term Preterm AB TAB SAB Ectopic Multiple Living   4 1 1  2 2    1      Review of Systems  Constitutional: Positive for activity change.  Respiratory: Negative for shortness of breath.   Cardiovascular: Negative for chest pain.  Gastrointestinal: Negative for nausea, vomiting and abdominal pain.  Genitourinary: Negative for dysuria.  Musculoskeletal: Negative for neck pain.  Neurological: Negative for headaches.  Psychiatric/Behavioral: Positive for behavioral problems and decreased concentration. Negative for suicidal ideas, hallucinations and self-injury.      Allergies  Review of patient's  allergies indicates no known allergies.  Home Medications   Prior to Admission medications   Medication Sig Start Date End Date Taking? Authorizing Provider  benztropine (COGENTIN) 0.5 MG tablet Take 1 tablet (0.5 mg total) by mouth 2 (two) times daily. 03/12/14   Fransisca KaufmannLaura Davis, NP  carbamazepine (TEGRETOL XR) 200 MG 12 hr tablet Take 1 tablet (200 mg total) by mouth 2 (two) times daily. 03/12/14   Fransisca KaufmannLaura Davis, NP  haloperidol (HALDOL) 5 MG tablet Take 1 tablet (5 mg total) by mouth 2 (two) times daily. 03/12/14   Fransisca KaufmannLaura Davis, NP  traZODone (DESYREL) 50 MG tablet Take 1 tablet (50 mg total) by mouth at bedtime as needed for sleep. 03/12/14   Fransisca KaufmannLaura Davis, NP   BP 115/70 mmHg  Pulse 106  Temp(Src) 97.7 F (36.5 C) (Oral)  Resp 16  SpO2 100%  LMP 09/26/2014 (Approximate) Physical Exam  Constitutional: She is oriented to person, place, and time. She appears well-developed and well-nourished.  HENT:  Head: Normocephalic and atraumatic.  Eyes: EOM are normal. Pupils are equal, round, and reactive to light.  Neck: Neck supple.  Cardiovascular: Normal rate, regular rhythm and normal heart sounds.   No murmur heard. Pulmonary/Chest: Effort normal. No respiratory distress.  Abdominal: Soft. She exhibits no distension. There is no tenderness. There is no rebound and no guarding.  Neurological: She is alert and oriented to person, place, and time.  Skin: Skin is warm and  dry.  Psychiatric: Her behavior is normal. Judgment normal.  Delusional  Nursing note and vitals reviewed.   ED Course  Procedures (including critical care time) Labs Review Labs Reviewed  BASIC METABOLIC PANEL  CBC WITH DIFFERENTIAL  URINE RAPID DRUG SCREEN (HOSP PERFORMED)  URINALYSIS, ROUTINE W REFLEX MICROSCOPIC  POC URINE PREG, ED    Imaging Review No results found.   EKG Interpretation   Date/Time:  Thursday October 10 2014 10:21:48 EST Ventricular Rate:  88 PR Interval:  127 QRS Duration: 84 QT Interval:   367 QTC Calculation: 444 R Axis:   79 Text Interpretation:  Sinus rhythm Confirmed by Rhunette CroftNANAVATI, MD, Janey GentaANKIT  [phone]([id number]) on 10/10/2014 10:54:24 AM      MDM   Final diagnoses:  Bipolar 1 disorder with moderate mania    DDx: Depression Bipolar disorder Schizophrenia Substance abuse Suicidal ideation Acute withdrawal  Pt with hx of bipolar, non compliance with meds, brought in by family for her behavioral problems. Pt is delusional, and appears to be in moderate manic breakdown. Will get TTS to eval.   Derwood KaplanAnkit Soren Pigman, MD 10/10/14 1101  2:42 PM Pt seen by TTS. She doesn't meet inpatient criteria - which i agree. They were able to secure an appt with her outpatient psych team today, and so we will d/c pt. Pt has mild-moderate mania right now, not full blown, and doesn't need to be ivc. Family is happy with the plan.  Derwood KaplanAnkit Marajade Lei, MD 10/10/14 [phone]
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
## 2015-07-22 ENCOUNTER — Emergency Department (HOSPITAL_COMMUNITY)
Admission: EM | Admit: 2015-07-22 | Discharge: 2015-07-22 | Disposition: A | Discharge status: Home / Self Care | Payer: Medicaid Other | Attending: Emergency Medicine | Admitting: Emergency Medicine

## 2015-07-22 ENCOUNTER — Encounter (HOSPITAL_COMMUNITY): Payer: Self-pay | Admitting: *Deleted

## 2015-07-22 ENCOUNTER — Inpatient Hospital Stay (HOSPITAL_COMMUNITY)
Admission: AD | Admit: 2015-07-22 | Discharge: 2015-07-26 | DRG: 885 | Disposition: A | Discharge status: Home / Self Care | Payer: Medicaid Other | Source: Intra-hospital | Attending: Psychiatry | Admitting: Psychiatry

## 2015-07-22 ENCOUNTER — Encounter (HOSPITAL_COMMUNITY): Payer: Self-pay | Admitting: Emergency Medicine

## 2015-07-22 DIAGNOSIS — F1210 Cannabis abuse, uncomplicated: Secondary | ICD-10-CM | POA: Diagnosis not present

## 2015-07-22 DIAGNOSIS — F1420 Cocaine dependence, uncomplicated: Secondary | ICD-10-CM | POA: Diagnosis present

## 2015-07-22 DIAGNOSIS — F259 Schizoaffective disorder, unspecified: Secondary | ICD-10-CM | POA: Diagnosis present

## 2015-07-22 DIAGNOSIS — R45851 Suicidal ideations: Secondary | ICD-10-CM | POA: Diagnosis present

## 2015-07-22 DIAGNOSIS — Z720 Tobacco use: Secondary | ICD-10-CM | POA: Diagnosis not present

## 2015-07-22 DIAGNOSIS — F32A Depression, unspecified: Secondary | ICD-10-CM

## 2015-07-22 DIAGNOSIS — E221 Hyperprolactinemia: Secondary | ICD-10-CM | POA: Diagnosis not present

## 2015-07-22 DIAGNOSIS — F319 Bipolar disorder, unspecified: Secondary | ICD-10-CM | POA: Diagnosis present

## 2015-07-22 DIAGNOSIS — F250 Schizoaffective disorder, bipolar type: Principal | ICD-10-CM | POA: Diagnosis present

## 2015-07-22 DIAGNOSIS — Z8619 Personal history of other infectious and parasitic diseases: Secondary | ICD-10-CM | POA: Insufficient documentation

## 2015-07-22 DIAGNOSIS — F309 Manic episode, unspecified: Secondary | ICD-10-CM | POA: Diagnosis present

## 2015-07-22 DIAGNOSIS — F1410 Cocaine abuse, uncomplicated: Secondary | ICD-10-CM | POA: Insufficient documentation

## 2015-07-22 DIAGNOSIS — Z3202 Encounter for pregnancy test, result negative: Secondary | ICD-10-CM | POA: Insufficient documentation

## 2015-07-22 DIAGNOSIS — F1220 Cannabis dependence, uncomplicated: Secondary | ICD-10-CM | POA: Diagnosis not present

## 2015-07-22 DIAGNOSIS — Z79899 Other long term (current) drug therapy: Secondary | ICD-10-CM | POA: Diagnosis not present

## 2015-07-22 DIAGNOSIS — F329 Major depressive disorder, single episode, unspecified: Secondary | ICD-10-CM

## 2015-07-22 DIAGNOSIS — F17200 Nicotine dependence, unspecified, uncomplicated: Secondary | ICD-10-CM | POA: Diagnosis present

## 2015-07-22 LAB — I-STAT BETA HCG BLOOD, ED (MC, WL, AP ONLY)

## 2015-07-22 LAB — COMPREHENSIVE METABOLIC PANEL
ALK PHOS: 82 U/L (ref 38–126)
ALT: 19 U/L (ref 14–54)
AST: 36 U/L (ref 15–41)
Albumin: 5 g/dL (ref 3.5–5.0)
Anion gap: 11 (ref 5–15)
BUN: 8 mg/dL (ref 6–20)
CALCIUM: 9.7 mg/dL (ref 8.9–10.3)
CHLORIDE: 103 mmol/L (ref 101–111)
CO2: 23 mmol/L (ref 22–32)
CREATININE: 1.02 mg/dL — AB (ref 0.44–1.00)
GFR calc Af Amer: >60 mL/min (ref >60)
Glucose, Bld: 96 mg/dL (ref 65–99)
Potassium: 3.7 mmol/L (ref 3.5–5.1)
SODIUM: 137 mmol/L (ref 135–145)
Total Bilirubin: 1 mg/dL (ref 0.3–1.2)
Total Protein: 8.1 g/dL (ref 6.5–8.1)

## 2015-07-22 LAB — CBC
HCT: 46.8 % — ABNORMAL HIGH (ref 36.0–46.0)
HEMOGLOBIN: 16.1 g/dL — AB (ref 12.0–15.0)
MCH: 31.3 pg (ref 26.0–34.0)
MCHC: 34.4 g/dL (ref 30.0–36.0)
MCV: 91.1 fL (ref 78.0–100.0)
Platelets: 207 10*3/uL (ref 150–400)
RBC: 5.14 MIL/uL — AB (ref 3.87–5.11)
RDW: 13.4 % (ref 11.5–15.5)
WBC: 9.7 10*3/uL (ref 4.0–10.5)

## 2015-07-22 LAB — RAPID URINE DRUG SCREEN, HOSP PERFORMED
AMPHETAMINES: NOT DETECTED
BARBITURATES: NOT DETECTED
Benzodiazepines: NOT DETECTED
Cocaine: POSITIVE — AB
Opiates: NOT DETECTED
Tetrahydrocannabinol: POSITIVE — AB

## 2015-07-22 LAB — ETHANOL: Alcohol, Ethyl (B): <5 mg/dL (ref <5)

## 2015-07-22 LAB — SALICYLATE LEVEL: Salicylate Lvl: <4 mg/dL (ref 2.8–30.0)

## 2015-07-22 LAB — ACETAMINOPHEN LEVEL: Acetaminophen (Tylenol), Serum: <10 ug/mL — ABNORMAL LOW (ref 10–30)

## 2015-07-22 MED ORDER — CARBAMAZEPINE ER 200 MG PO TB12
200.0000 mg | ORAL_TABLET | Freq: Two times a day (BID) | ORAL | Status: DC
  Administered 2015-07-22: 200 mg via ORAL
  Filled 2015-07-22 (×2): qty 1

## 2015-07-22 MED ORDER — TRAZODONE HCL 50 MG PO TABS
50.0000 mg | ORAL_TABLET | Freq: Every evening | ORAL | Status: DC | PRN
  Administered 2015-07-22: 50 mg via ORAL
  Filled 2015-07-22: qty 1

## 2015-07-22 MED ORDER — ACETAMINOPHEN 325 MG PO TABS: 650.0000 mg | ORAL_TABLET | ORAL | Status: DC | PRN

## 2015-07-22 MED ORDER — LORAZEPAM 1 MG PO TABS
1.0000 mg | ORAL_TABLET | Freq: Three times a day (TID) | ORAL | Status: DC | PRN
  Administered 2015-07-22: 1 mg via ORAL
  Filled 2015-07-22: qty 1

## 2015-07-22 MED ORDER — HALOPERIDOL 5 MG PO TABS
5.0000 mg | ORAL_TABLET | Freq: Two times a day (BID) | ORAL | Status: DC
  Administered 2015-07-22 – 2015-07-25 (×6): 5 mg via ORAL
  Filled 2015-07-22 (×12): qty 1

## 2015-07-22 MED ORDER — CARBAMAZEPINE ER 200 MG PO TB12
200.0000 mg | ORAL_TABLET | Freq: Two times a day (BID) | ORAL | Status: DC
  Administered 2015-07-22 – 2015-07-26 (×8): 200 mg via ORAL
  Filled 2015-07-22 (×16): qty 1

## 2015-07-22 MED ORDER — HALOPERIDOL 5 MG PO TABS
5.0000 mg | ORAL_TABLET | Freq: Two times a day (BID) | ORAL | Status: DC
  Administered 2015-07-22: 5 mg via ORAL
  Filled 2015-07-22: qty 1

## 2015-07-22 MED ORDER — BENZTROPINE MESYLATE 1 MG PO TABS
0.5000 mg | ORAL_TABLET | Freq: Two times a day (BID) | ORAL | Status: DC
  Administered 2015-07-22: 0.5 mg via ORAL
  Filled 2015-07-22: qty 1

## 2015-07-22 MED ORDER — HYDROXYZINE HCL 25 MG PO TABS: 25.0000 mg | ORAL_TABLET | Freq: Four times a day (QID) | ORAL | Status: DC | PRN

## 2015-07-22 MED ORDER — BENZTROPINE MESYLATE 0.5 MG PO TABS
0.5000 mg | ORAL_TABLET | Freq: Two times a day (BID) | ORAL | Status: DC
  Administered 2015-07-22 – 2015-07-26 (×8): 0.5 mg via ORAL
  Filled 2015-07-22 (×16): qty 1

## 2015-07-22 NOTE — Progress Notes (Signed)
D   Pt has spent the entire shift in bed  She requested i bring her medication to her as she was lightheaded   She endorses depression and some anxiety  A   Verbal support given   Educate on medications and administered same   Monitor for effectiveness    Q 15 min checks R   Pt is safe and verbalized knowledge
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
## 2015-07-22 NOTE — BH Assessment (Signed)
Patient accepted to [facility] by Dr. Jannifer Franklin and Julieanne Cotton, NP. The attending provider is Dr. Abelina Bachelor. Room assignment is 504-1. Support paperwork completed and faxed to [facility]. Nursing report [phone].
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
## 2015-07-22 NOTE — ED Notes (Signed)
Pt wanded by security. Belongings are behind desk w/ nurses.
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
## 2015-07-22 NOTE — ED Notes (Signed)
Patient appears anxious, restless. Reports anxiety 10/10, unable to quantify feelings of depression at present. Reports using medication to aid in sleep. Denies any change in weight or appetite in recent weeks.  Encouragement offered. Oriented to the unit. Given snack, Ativan.  Q 15 safety checks in place.
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
## 2015-07-22 NOTE — Tx Team (Addendum)
Initial Interdisciplinary Treatment Plan   PATIENT STRESSORS: Marital or family conflict Medication change or noncompliance Substance abuse   PATIENT STRENGTHS: Average or above average intelligence Physical Health   PROBLEM LIST: Problem List/Patient Goals Date to be addressed Date deferred Reason deferred Estimated date of resolution  Altered thought process 07/22/2015     Substance abuse 07/22/2015     Alteration in mood 07/22/2015                 "I need to work on my anxiety and how to clear my thoughts"                         DISCHARGE CRITERIA:  Ability to meet basic life and health needs Improved stabilization in mood, thinking, and/or behavior Reduction of life-threatening or endangering symptoms to within safe limits  PRELIMINARY DISCHARGE PLAN: Attend aftercare/continuing care group Outpatient therapy Return to previous living arrangement  PATIENT/FAMIILY INVOLVEMENT: This treatment plan has been presented to and reviewed with the patient, Makayla Medina, and/or family member,.  The patient and family have been given the opportunity to ask questions and make suggestions.  Harvel Quale 07/22/2015, 5:50 PM

## 2015-07-23 ENCOUNTER — Encounter (HOSPITAL_COMMUNITY): Payer: Self-pay

## 2015-07-23 DIAGNOSIS — F250 Schizoaffective disorder, bipolar type: Secondary | ICD-10-CM | POA: Diagnosis present

## 2015-07-23 DIAGNOSIS — F1220 Cannabis dependence, uncomplicated: Secondary | ICD-10-CM | POA: Diagnosis present

## 2015-07-23 DIAGNOSIS — F17200 Nicotine dependence, unspecified, uncomplicated: Secondary | ICD-10-CM | POA: Diagnosis present

## 2015-07-23 DIAGNOSIS — Z720 Tobacco use: Secondary | ICD-10-CM

## 2015-07-23 DIAGNOSIS — F1420 Cocaine dependence, uncomplicated: Secondary | ICD-10-CM

## 2015-07-23 MED ORDER — TRAZODONE HCL 50 MG PO TABS
50.0000 mg | ORAL_TABLET | Freq: Every day | ORAL | Status: DC
  Administered 2015-07-23 – 2015-07-25 (×3): 50 mg via ORAL
  Filled 2015-07-23 (×5): qty 1

## 2015-07-23 MED ORDER — ENSURE ENLIVE PO LIQD
237.0000 mL | Freq: Two times a day (BID) | ORAL | Status: DC
  Administered 2015-07-25: 237 mL via ORAL

## 2015-07-23 NOTE — Progress Notes (Signed)
Pt has been in hr room most of the evening.  She had a female visitor this evening who brought her some belongings.  Conversation with pt was minimal.  She was encouraged to make her needs known to staff.  She denies SI/HI/AVH.  She forwards little and stays to herself.  Pt voices no needs or concerns this evening.  Support and encouragement offered.  Safety maintained with q15 minute checks.
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
## 2015-07-23 NOTE — Progress Notes (Signed)
Patient ID: Makayla Medina, female   DOB: 03-Dec-1985, 29 y.o.   MRN: [id number]  DAR: Pt. Denies SI/HI and A/V Hallucinations. Patient does not report any pain or discomfort at this time. Support and encouragement provided to the patient. Scheduled medications administered to patient per physician's orders. Patient has required no PRN medications at this time. Patient remains isolative and minimal with Clinical research associate and somewhat irritable. She forwards very little even with prompting. Patient is able to complete ADL's and make her needs known to staff. Q15 minute checks are maintained for safety.

## 2015-07-23 NOTE — Progress Notes (Signed)
Pt stated that her goal was to find out when her release date is because she doesn't need to be here. However, she had a good day otherwise.
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
## 2015-07-23 NOTE — BHH Group Notes (Signed)
[facility] [employer] Group Therapy  07/23/2015 , 1:50 PM    Type of Therapy:  [employer] Presentation  Participation Level:  Invited.  Chose to not attend  Summary of Progress/Problems:  Onalee Hua from [employer] came to present his recovery story and play the guitar.    Daryel Gerald B 07/23/2015 , 1:50 PM

## 2015-07-23 NOTE — Plan of Care (Signed)
Problem: Alteration in thought process Goal: STG-Patient is able to follow short directions Outcome: Progressing PAtient able to follow problems with no distress

## 2015-07-23 NOTE — Progress Notes (Signed)
NUTRITION ASSESSMENT  Pt identified as at risk on the Malnutrition Screen Tool  INTERVENTION: 1. Supplements: Continue Ensure Enlive po BID, each supplement provides 350 kcal and 20 grams of protein   NUTRITION DIAGNOSIS: Unintentional weight loss related to sub-optimal intake as evidenced by pt report.   Goal: Pt to meet >/= 90% of their estimated nutrition needs.  Monitor:  PO intake  Assessment:  Pt admitted with depression, anxiety, and cocaine use. Per RN note, pt has been in bed most of the day.  Pt with progressive weight loss since last year. Pt has been ordered Ensure supplements via [facility] protocol.    Height: Ht Readings from Last 1 Encounters:  07/22/15  (1.676 m)    Weight: Wt Readings from Last 1 Encounters:  07/22/15 175 lb (79.379 kg)    Weight Hx: Wt Readings from Last 10 Encounters:  07/22/15 175 lb (79.379 kg)  03/04/14 182 lb (82.555 kg)  02/19/14 176 lb (79.833 kg)  12/14/13 193 lb (87.544 kg)  07/05/11 144 lb 12.8 oz (65.681 kg)    BMI:  Body mass index is 28.26 kg/(m^2). Pt meets criteria for overweight based on current BMI.  Estimated Nutritional Needs: Kcal: 25-30 kcal/kg Protein: > 1 gram protein/kg Fluid: 1 ml/kcal  Diet Order: Diet regular Room service appropriate?: Yes; Fluid consistency:: Thin Pt is also offered choice of unit snacks mid-morning and mid-afternoon.  Pt is eating as desired.   Lab results and medications reviewed.   Tilda Franco, MS, RD, LDN Pager: [phone] After Hours Pager: [phone]
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
## 2015-07-24 LAB — BASIC METABOLIC PANEL
Anion gap: 7 (ref 5–15)
BUN: 8 mg/dL (ref 6–20)
CALCIUM: 9 mg/dL (ref 8.9–10.3)
CHLORIDE: 105 mmol/L (ref 101–111)
CO2: 24 mmol/L (ref 22–32)
CREATININE: 0.98 mg/dL (ref 0.44–1.00)
GFR calc non Af Amer: >60 mL/min (ref >60)
Glucose, Bld: 91 mg/dL (ref 65–99)
Potassium: 3.4 mmol/L — ABNORMAL LOW (ref 3.5–5.1)
SODIUM: 136 mmol/L (ref 135–145)

## 2015-07-24 LAB — LIPID PANEL
CHOLESTEROL: 157 mg/dL (ref 0–200)
HDL: 36 mg/dL — ABNORMAL LOW (ref >40)
LDL Cholesterol: 98 mg/dL (ref 0–99)
Total CHOL/HDL Ratio: 4.4 RATIO
Triglycerides: 117 mg/dL (ref <150)
VLDL: 23 mg/dL (ref 0–40)

## 2015-07-24 LAB — TSH: TSH: 0.963 u[IU]/mL (ref 0.350–4.500)

## 2015-07-24 MED ORDER — POTASSIUM CHLORIDE CRYS ER 10 MEQ PO TBCR
10.0000 meq | EXTENDED_RELEASE_TABLET | Freq: Two times a day (BID) | ORAL | Status: AC
  Administered 2015-07-24 – 2015-07-25 (×2): 10 meq via ORAL
  Filled 2015-07-24 (×3): qty 1

## 2015-07-24 NOTE — Progress Notes (Signed)
Patient ID: Makayla Medina File, female   DOB: 1986/06/25, 29 y.o.   MRN: [id number] D  --  Pt. Denies pain this shift.   She has good eye contact and maintains a calm , pleasant affect.  She is looking forward to going home on Saturday, having signed  72 hr. Request for DC.  Pt. Stays to herself and has little to say , but is friendly on approach by staff.  Pt. Attends unit groups with adiquate participation.  Pt. Contracts for safety. --- A --  Support as needed -- R ---  Pt. Remains safe and pleasant on unit
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
## 2015-07-24 NOTE — Progress Notes (Signed)
Pt reports she is doing "ok" today.  She says that she has to learn to not let people get the best of her and watch how she reacts to people.  She was in bed at the beginning of the shift and was moved to another room to accommodate her and another pt concerning the temp of the room.  Pt was pleased with the move.  Pt was observed later on the phone talking to someone and was obviously upset and tearful.  She would not elaborate with staff about the situation.  Pt denies SI/HI/AVH.  She says she is supposed to discharge home on Saturday and is looking forward to that.  She was encouraged to make her needs and concerns known to staff.  Support and encouragement offered.  Safety maintained with q15 minute checks.
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
## 2015-07-24 NOTE — [facility] Group Notes (Signed)
[facility] Group Notes:  (Counselor/Nursing/MHT/Case Management/Adjunct)  07/24/2015 1:15PM  Type of Therapy:  Group Therapy  Participation Level:  Active  Participation Quality:  Appropriate  Affect:  Flat  Cognitive:  Oriented  Insight:  Improving  Engagement in Group:  Limited  Engagement in Therapy:  Limited  Modes of Intervention:  Discussion, Exploration and Socialization  Summary of Progress/Problems: The topic for group was balance in life.  Pt participated in the discussion about when their life was in balance and out of balance and how this feels.  Pt discussed ways to get back in balance and short term goals they can work on to get where they want to be. Engaged.  Stayed for about half of the group before leaving.  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  It was clear she misses that as she was talking about it.  Has tried to duplicate it since, but has not found it.  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   Makayla Medina 07/24/2015 3:43 PM

## 2015-07-24 NOTE — [facility] Group Notes (Signed)
[facility] Group Notes:  (Nursing/MHT/Case Management/Adjunct)  Date:  07/24/2015  Time:  5:51 PM  Type of Therapy:  self esteem  Participation Level:  Active  Participation Quality:  Appropriate  Affect:  Appropriate and Blunted  Cognitive:  Alert  Insight:  Good  Engagement in Group:  Engaged  Modes of Intervention:  Discussion  Summary of Progress/Problems:  pt. Said how important it is for a person to feel good about themselves and how it effects the way others treat them Makayla Medina 07/24/2015, 5:51 PM

## 2015-07-25 DIAGNOSIS — E221 Hyperprolactinemia: Secondary | ICD-10-CM | POA: Clinically undetermined

## 2015-07-25 LAB — PROLACTIN: PROLACTIN: 100.1 ng/mL — AB (ref 4.8–23.3)

## 2015-07-25 MED ORDER — HALOPERIDOL DECANOATE 100 MG/ML IM SOLN
75.0000 mg | INTRAMUSCULAR | Status: DC
  Administered 2015-07-25: 75 mg via INTRAMUSCULAR
  Filled 2015-07-25: qty 0.75
  Filled 2015-07-25: qty 1

## 2015-07-25 MED ORDER — ARIPIPRAZOLE 5 MG PO TABS
5.0000 mg | ORAL_TABLET | Freq: Every day | ORAL | Status: DC
  Administered 2015-07-25: 5 mg via ORAL
  Filled 2015-07-25 (×3): qty 1

## 2015-07-25 MED ORDER — HALOPERIDOL 5 MG PO TABS
5.0000 mg | ORAL_TABLET | Freq: Every morning | ORAL | Status: DC
  Administered 2015-07-26: 5 mg via ORAL
  Filled 2015-07-25 (×3): qty 1

## 2015-07-25 NOTE — Progress Notes (Signed)
[facility] Group Notes:  (Nursing/MHT/Case Management/Adjunct)  Date:  07/25/2015  Time:  10:40 PM  Type of Therapy:  Psychoeducational Skills  Participation Level:  Active  Participation Quality:  Appropriate  Affect:  Appropriate  Cognitive:  Appropriate  Insight:  Good  Engagement in Group:  Engaged  Modes of Intervention:  Education  Summary of Progress/Problems: The patient shared with the group that she had a good day and that she anticipates being discharged tomorrow. As a theme for the day, her relapse prevention will include going back to work and to stop worrying about other and focus on herself. She states that this may sound selfish of her to say such things (focusing on herself), but this author encouraged to her to take time for herself and that this was not a selfish thought .   Hazle Coca S 07/25/2015, 10:40 PM

## 2015-07-25 NOTE — Progress Notes (Signed)
  [facility] Adult Case Management Discharge Plan :  Will you be returning to the same living situation after discharge:  Yes,  home At discharge, do you have transportation home?: Yes,  friend Do you have the ability to pay for your medications: Yes,  MCD  Release of information consent forms completed and in the chart;  Patient's signature needed at discharge.  Patient to Follow up at: Follow-up Information    Follow up with [facility] On 08/09/2015.   Why:  Saturday at 1:15 with the Dr.  I tried to call [name], your therapist, at [phone], to schedule another apppointment, but her VM was full and it disconnected me.  You will need to call yourself to make another appointment, or call the office   Contact information:   [address] [phone]       Patient denies SI/HI: Yes,  yes    Aeronautical engineer and Suicide Prevention discussed: Yes,  yes  Have you used any form of tobacco in the last 30 days? (Cigarettes, Smokeless Tobacco, Cigars, and/or Pipes): Patient Refused Screening  Has patient been referred to the Quitline?: Patient refused referral  Ida Rogue 07/25/2015, 12:24 PM

## 2015-07-25 NOTE — [facility] Group Notes (Signed)
[facility] LCSW Group Therapy  07/25/2015 2:17 PM  Type of Therapy:  Group Therapy  Participation Level:  Active  Participation Quality:  Attentive  Affect:  Flat  Cognitive:  Oriented  Insight:  Limited  Engagement in Therapy:  Engaged  Modes of Intervention:  Discussion and Socialization  Summary of Progress/Problems: Chaplain was here to lead a group on themes of hope and/or courage Pt was pleasant and gave appropriate input when asked. Pt spoke about her passion for helping others and stated that she uses it as a coping skill to help her not think about drugs. 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  Pt also spoke about wanting a place to stay that is "all my own". Describes current living situation as not ideal.  Staying with ex boyfriend.  Jonathon Jordan 07/25/2015, 2:17 PM
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
## 2015-07-25 NOTE — Plan of Care (Signed)
Problem: Alteration in thought process Goal: STG-Patient is able to sleep at least 6 hours per night Outcome: Completed/Met Date Met:  07/25/15 Pt reports she has been able to sleep well at night while on the unit.

## 2015-07-25 NOTE — Progress Notes (Signed)
Pt reports she has had a good day.  She states she is supposed to discharge tomorrow around lunchtime back to live with her BF.  She says that he is her only support.  He has been the only one visiting her while she has been on the unit.  She says her family is not supportive at all, esp her mother.  She denies SI/HI/AVH.  She has been pleasant and cooperative on the unit this evening.  Pt voices no needs or concerns at this time.  Support and encouragement offered.  Safety maintained with q15 minute checks.
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
## 2015-07-25 NOTE — Plan of Care (Signed)
Problem: Alteration in thought process Goal: LTG-Patient has not harmed self or others in at least 2 days Outcome: Progressing Pt has been able to remain in control and has not harmed herself or anyone else while on the unit.

## 2015-07-25 NOTE — BHH Group Notes (Signed)
[facility] LCSW Aftercare Discharge Planning Group Note   07/25/2015 1:02 PM  Participation Quality:  Active  Mood/Affect:  Appropriate  Depression Rating:    Anxiety Rating:    Thoughts of Suicide:  No Will you contract for safety?   No  Current AVH:  No    Plan for Discharge/Comments:  Pt was cooperative and pleasant in group today. She offered input when asked. Pt also asked server al questions about her upcoming discharge, she is looking forward to leaving the hospital.   Transportation Means: Pt will take the bus downtown where she will then be picked up by a friend.  Supports: Pt is receiving services at [facility] [place] following her hospital stay.   Jonathon Jordan

## 2015-07-25 NOTE — Progress Notes (Signed)
D-  Patients presents with blunted affect mood is depressed and irritable, continues to have difficulty with her sleep awakens 2-3 x a night,  Adl's are poor, encouraged to shower, appetite has improved eating meals in the cafeteria..Pt reports not trusting her ex boyfriend, " He wants all the benefits but he doesn't to get married"   A- Support and Encouragement provided, Allowed patient to ventilate during 1:1.Haldol dec IM given  R- Will continue to monitor on q 15 minute checks for safety, compliant with medications and programming

## 2015-07-26 DIAGNOSIS — F250 Schizoaffective disorder, bipolar type: Principal | ICD-10-CM

## 2015-07-26 LAB — BASIC METABOLIC PANEL
ANION GAP: 8 (ref 5–15)
BUN: 7 mg/dL (ref 6–20)
CALCIUM: 9.2 mg/dL (ref 8.9–10.3)
CHLORIDE: 104 mmol/L (ref 101–111)
CO2: 24 mmol/L (ref 22–32)
Creatinine, Ser: 0.85 mg/dL (ref 0.44–1.00)
GFR calc non Af Amer: >60 mL/min (ref >60)
Glucose, Bld: 91 mg/dL (ref 65–99)
Potassium: 4 mmol/L (ref 3.5–5.1)
Sodium: 136 mmol/L (ref 135–145)

## 2015-07-26 MED ORDER — CARBAMAZEPINE ER 200 MG PO TB12: 200.0000 mg | ORAL_TABLET | Freq: Two times a day (BID) | ORAL | Status: DC

## 2015-07-26 MED ORDER — ARIPIPRAZOLE 5 MG PO TABS: 5.0000 mg | ORAL_TABLET | Freq: Every day | ORAL | Status: DC

## 2015-07-26 MED ORDER — HALOPERIDOL 5 MG PO TABS: 5.0000 mg | ORAL_TABLET | Freq: Every morning | ORAL | Status: DC

## 2015-07-26 MED ORDER — TRAZODONE HCL 50 MG PO TABS: 50.0000 mg | ORAL_TABLET | Freq: Every day | ORAL | Status: DC

## 2015-07-26 MED ORDER — HALOPERIDOL DECANOATE 100 MG/ML IM SOLN: 75.0000 mg | INTRAMUSCULAR | Status: DC

## 2015-07-26 MED ORDER — HYDROXYZINE HCL 25 MG PO TABS: 25.0000 mg | ORAL_TABLET | Freq: Four times a day (QID) | ORAL | Status: DC | PRN

## 2015-07-26 MED ORDER — BENZTROPINE MESYLATE 0.5 MG PO TABS: 0.5000 mg | ORAL_TABLET | Freq: Two times a day (BID) | ORAL | Status: DC

## 2015-07-26 NOTE — BHH Suicide Risk Assessment (Signed)
[facility] Discharge Suicide Risk Assessment   Demographic Factors:  Living alone  Total Time spent with patient: 45 minutes  Musculoskeletal: Strength & Muscle Tone: within normal limits Gait & Station: normal Patient leans: N/A  Psychiatric Specialty Exam: Physical Exam  ROS  Blood pressure 108/66, pulse 64, temperature 98 F (36.7 C), temperature source Oral, resp. rate 16, height  (1.676 m), weight 79.379 kg (175 lb), unknown if currently breastfeeding.Body mass index is 28.26 kg/(m^2).  General Appearance: Casual  Eye Contact::  Good  Speech:  Normal Rate409  Volume:  Normal  Mood:  Anxious  Affect:  Appropriate  Thought Process:  Coherent  Orientation:  Full (Time, Place, and Person)  Thought Content:  WDL  Suicidal Thoughts:  No  Homicidal Thoughts:  No  Memory:  Immediate;   Good Recent;   Good Remote;   Good  Judgement:  Fair  Insight:  Good  Psychomotor Activity:  Normal  Concentration:  Fair  Recall:  Good  Fund of Knowledge:Fair  Language: Good  Akathisia:  No  Handed:  Right  AIMS (if indicated):     Assets:  Communication Skills Desire for Improvement Financial Resources/Insurance Housing Physical Health Social Support  Sleep:  Number of Hours: 5.5  Cognition: WNL  ADL's:  Intact   Have you used any form of tobacco in the last 30 days? (Cigarettes, Smokeless Tobacco, Cigars, and/or Pipes): Patient Refused Screening  Has this patient used any form of tobacco in the last 30 days? (Cigarettes, Smokeless Tobacco, Cigars, and/or Pipes) Yes, Prescription not provided because: Patient refused  Mental Status Per Nursing Assessment::   On Admission:   (pt will not answer)  Current Mental Status by Physician: NA  Loss Factors: Noncompliance with medication  Historical Factors: Prior suicide attempts and Impulsivity  Risk Reduction Factors:   Responsible for children under 63 years of age, Sense of responsibility to family, Religious beliefs about  death, Employed, Positive social support, Positive therapeutic relationship and Positive coping skills or problem solving skills  Continued Clinical Symptoms:  Schizophrenia:   Less than 26 years old More than one psychiatric diagnosis Unstable or Poor Therapeutic Relationship Previous Psychiatric Diagnoses and Treatments  Cognitive Features That Contribute To Risk:  None    Suicide Risk:  Minimal: No identifiable suicidal ideation.  Patients presenting with no risk factors but with morbid ruminations; may be classified as minimal risk based on the severity of the depressive symptoms  Principal Problem: Schizoaffective disorder, bipolar type Discharge Diagnoses:  Patient Active Problem List   Diagnosis Date Noted  . Hyperprolactinemia [E22.1] 07/25/2015  . Schizoaffective disorder, bipolar type [F25.0] 07/23/2015  . Cannabis use disorder, moderate, dependence [F12.20] 07/23/2015  . Tobacco use disorder [Z72.0] 07/23/2015  . Cocaine use disorder, severe, dependence [F14.20] 07/22/2015    Follow-up Information    Follow up with [facility] On 08/09/2015.   Why:  Saturday at 1:15 with the Dr.  I tried to call [name], your therapist, at [phone], to schedule another apppointment, but her VM was full and it disconnected me.  You will need to call yourself to make another appointment, or call the office   Contact information:   [address]  [facility] [phone]       Plan Of Care/Follow-up recommendations:  Activity:  As tolerated Diet:  Unchanged from the past  Is patient on multiple antipsychotic therapies at discharge:  Yes,   Do you recommend tapering to monotherapy for antipsychotics?  Patient is on Haldol back and Abilify was added to reduce hyperprolactinemia.  Patient will address this issue with her psychiatrist on her next appointment.   Has Patient had three or more failed trials of antipsychotic monotherapy by history:  No  Recommended Plan  for Multiple Antipsychotic Therapies: Taper to monotherapy as described:  Patient will discuss with her outpatient psychiatrist as she is taking Abilify to reduce hyperproteinemia.  She wants to continue Haldol Decanoate which is helping her psychosis.    Makayla Medina T. 07/26/2015, 11:26 AM

## 2015-07-26 NOTE — Progress Notes (Signed)
Pt discharged home with family, a female and a female Pt was ambulatory and well at the time of dischsrge, no complains  , All papers and prescriptions were given and valuables returned. [place]  understanding expressed. Denies SI/HI and A/VH. Pt given opportunity to express concerns and asked questions.

## 2015-07-26 NOTE — Discharge Summary (Signed)
Physician Discharge Summary Note  Patient:  Makayla Medina is an 29 y.o., female MRN:  [id number] DOB:  May 20, 1986 Patient phone:  [phone] (home)  Patient address:   [address] [place] [address],  Total Time spent with patient: 45 minutes  Date of Admission:  07/22/2015 Date of Discharge: 07/26/2015  Reason for Admission:  psychosis  Principal Problem: Schizoaffective disorder, bipolar type Discharge Diagnoses: Patient Active Problem List   Diagnosis Date Noted  . Hyperprolactinemia [E22.1] 07/25/2015  . Schizoaffective disorder, bipolar type [F25.0] 07/23/2015  . Cannabis use disorder, moderate, dependence [F12.20] 07/23/2015  . Tobacco use disorder [Z72.0] 07/23/2015  . Cocaine use disorder, severe, dependence [F14.20] 07/22/2015    Musculoskeletal: Strength & Muscle Tone: within normal limits Gait & Station: normal Patient leans: N/A  Psychiatric Specialty Exam:  SEE SRA Physical Exam  Vitals reviewed. Psychiatric: Her mood appears anxious. Thought content is not paranoid and not delusional. She does not exhibit a depressed mood. She expresses no homicidal and no suicidal ideation.    Review of Systems  All other systems reviewed and are negative.   Blood pressure 108/66, pulse 64, temperature 98 F (36.7 C), temperature source Oral, resp. rate 16, height  (1.676 m), weight 79.379 kg (175 lb), unknown if currently breastfeeding.Body mass index is 28.26 kg/(m^2).  Have you used any form of tobacco in the last 30 days? (Cigarettes, Smokeless Tobacco, Cigars, and/or Pipes): Patient Refused Screening  Has this patient used any form of tobacco in the last 30 days? (Cigarettes, Smokeless Tobacco, Cigars, and/or Pipes) N/A  Past Medical History:  Past Medical History  Diagnosis Date  . Anxiety   . Bipolar disorder   . Vaginal Pap smear, abnormal   . Gonorrhea   . Chlamydia     Past Surgical History  Procedure Laterality Date  . Dilation and  curettage of uterus    . Colposcopy     Family History:  Family History  Problem Relation Age of Onset  . Alcoholism Mother    Social History:  History  Alcohol Use  . 0.6 oz/week  . 1 Glasses of wine per week    Comment: socially     History  Drug Use  . Yes  . Special: Marijuana, Cocaine    Comment: 3 days ago. Uses once a week.    Social History   Social History  . Marital Status: Single    Spouse Name: N/A  . Number of Children: N/A  . Years of Education: N/A   Social History Main Topics  . Smoking status: Never Smoker   . Smokeless tobacco: None  . Alcohol Use: 0.6 oz/week    1 Glasses of wine per week     Comment: socially  . Drug Use: Yes    Special: Marijuana, Cocaine     Comment: 3 days ago. Uses once a week.  Marland Kitchen Sexual Activity: Yes     Comment: pt will niot answer   Other Topics Concern  . None   Social History Narrative   Risk to Self: Is patient at risk for suicide?: No Risk to Others:   Prior Inpatient Therapy:   Prior Outpatient Therapy:    Level of Care:  OP  Hospital Course:  Shine Scrogham was admitted for Schizoaffective disorder, bipolar type and crisis management.  She was treated discharged with the medications listed below under Medication List.  Medical problems were identified and treated as needed.  Home medications were restarted as appropriate.  Improvement was monitored by observation and [name] daily report of symptom reduction.  Emotional and mental status was monitored by daily self-inventory reports completed by [name] and clinical staff.         [name] was evaluated by the treatment team for stability and plans for continued recovery upon discharge.  [name] motivation was an integral factor for scheduling further treatment.  Employment, transportation, bed availability, health status, family support, and any pending legal issues were also considered during her hospital stay.  She was  offered further treatment options upon discharge including but not limited to Residential, Intensive Outpatient, and Outpatient treatment.  [name] will follow up with the services as listed below under Follow Up Information.     Upon completion of this admission the patient was both mentally and medically stable for discharge denying suicidal/homicidal ideation, auditory/visual/tactile hallucinations, delusional thoughts and paranoia.      Consults:  psychiatry  Significant Diagnostic Studies:  labs: per ED  Discharge Vitals:   Blood pressure 108/66, pulse 64, temperature 98 F (36.7 C), temperature source Oral, resp. rate 16, height 5\' 6"  (1.676 m), weight 79.379 kg (175 lb), unknown if currently breastfeeding. Body mass index is 28.26 kg/(m^2). Lab Results:   Results for orders placed or performed during the hospital encounter of 07/22/15 (from the past 72 hour(s))  Basic metabolic panel     Status: Abnormal   Collection Time: 07/24/15  6:45 AM  Result Value Ref Range   Sodium 136 135 - 145 mmol/L   Potassium 3.4 (L) 3.5 - 5.1 mmol/L   Chloride 105 101 - 111 mmol/L   CO2 24 22 - 32 mmol/L   Glucose, Bld 91 65 - 99 mg/dL   BUN 8 6 - 20 mg/dL   Creatinine, Ser 09/23/15 0.44 - 1.00 mg/dL   Calcium 9.0 8.9 - 8.60 mg/dL   GFR calc non Af Amer >60 >60 mL/min   GFR calc Af Amer >60 >60 mL/min    Comment: (NOTE) The eGFR has been calculated using the CKD EPI equation. This calculation has not been validated in all clinical situations. eGFR's persistently <60 mL/min signify possible Chronic Kidney Disease.    Anion gap 7 5 - 15    Comment: Performed at [facility]  Lipid panel     Status: Abnormal   Collection Time: 07/24/15  6:45 AM  Result Value Ref Range   Cholesterol 157 0 - 200 mg/dL   Triglycerides 09/23/15 776 mg/dL   HDL 36 (L) <490 mg/dL   Total CHOL/HDL Ratio 4.4 RATIO   VLDL 23 0 - 40 mg/dL   LDL Cholesterol 98 0 - 99 mg/dL    Comment:        Total  Cholesterol/HDL:CHD Risk Coronary Heart Disease Risk Table                     Men   Women  1/2 Average Risk   3.4   3.3  Average Risk       5.0   4.4  2 X Average Risk   9.6   7.1  3 X Average Risk  23.4   11.0        Use the calculated Patient Ratio above and the CHD Risk Table to determine the patient's CHD Risk.        ATP III CLASSIFICATION (LDL):  <100     mg/dL   Optimal  >42  mg/dL  Near or Above                    Optimal  130-159  mg/dL   Borderline  160-189  mg/dL   High  >190     mg/dL   Very High Performed at [facility]   Prolactin     Status: Abnormal   Collection Time: 07/24/15  6:45 AM  Result Value Ref Range   Prolactin 100.1 (H) 4.8 - 23.3 ng/mL    Comment: (NOTE) Performed At: [facility] [place], [place] [id number] Lindon Romp MD PP:[id number] Performed at [facility]   TSH     Status: None   Collection Time: 07/24/15  6:54 AM  Result Value Ref Range   TSH 0.963 0.350 - 4.500 uIU/mL    Comment: Performed at [facility] metabolic panel     Status: None   Collection Time: 07/26/15  6:32 AM  Result Value Ref Range   Sodium 136 135 - 145 mmol/L   Potassium 4.0 3.5 - 5.1 mmol/L   Chloride 104 101 - 111 mmol/L   CO2 24 22 - 32 mmol/L   Glucose, Bld 91 65 - 99 mg/dL   BUN 7 6 - 20 mg/dL   Creatinine, Ser 0.85 0.44 - 1.00 mg/dL   Calcium 9.2 8.9 - 10.3 mg/dL   GFR calc non Af Amer >60 >60 mL/min   GFR calc Af Amer >60 >60 mL/min    Comment: (NOTE) The eGFR has been calculated using the CKD EPI equation. This calculation has not been validated in all clinical situations. eGFR's persistently <60 mL/min signify possible Chronic Kidney Disease.    Anion gap 8 5 - 15    Comment: Performed at [facility]    Physical Findings: AIMS: Facial and Oral Movements Muscles of Facial Expression: None, normal Lips and Perioral Area: None, normal Jaw:  None, normal Tongue: None, normal,Extremity Movements Upper (arms, wrists, hands, fingers): None, normal Lower (legs, knees, ankles, toes): None, normal, Trunk Movements Neck, shoulders, hips: None, normal, Overall Severity Severity of abnormal movements (highest score from questions above): None, normal Incapacitation due to abnormal movements: None, normal Patient's awareness of abnormal movements (rate only patient's report): No Awareness, Dental Status Current problems with teeth and/or dentures?: No Does patient usually wear dentures?: No  CIWA:  CIWA-Ar Total: 2 COWS:  COWS Total Score: 0   See Psychiatric Specialty Exam and Suicide Risk Assessment completed by Attending Physician prior to discharge.  Discharge destination:  Home  Is patient on multiple antipsychotic therapies at discharge:  No   Has Patient had three or more failed trials of antipsychotic monotherapy by history:  No    Recommended Plan for Multiple Antipsychotic Therapies: NA     Medication List    TAKE these medications      Indication   ARIPiprazole 5 MG tablet  Commonly known as:  ABILIFY  Take 1 tablet (5 mg total) by mouth at bedtime.   Indication:  mood stabilization     benztropine 0.5 MG tablet  Commonly known as:  COGENTIN  Take 1 tablet (0.5 mg total) by mouth 2 (two) times daily.   Indication:  Extrapyramidal Reaction caused by Medications     carbamazepine 200 MG 12 hr tablet  Commonly known as:  TEGRETOL XR  Take 1 tablet (200 mg total) by mouth 2 (two) times daily.   Indication:  agitated mood  haloperidol 5 MG tablet  Commonly known as:  HALDOL  Take 1 tablet (5 mg total) by mouth every morning.   Indication:  Psychosis, Delusions/agitation     haloperidol decanoate 100 MG/ML injection  Commonly known as:  HALDOL DECANOATE  Inject 0.75 mLs (75 mg total) into the muscle every 30 (thirty) days. Last dose was given 07/25/2015.  Next dose due 08/24/2015.   Indication:   Psychosis     hydrOXYzine 25 MG tablet  Commonly known as:  ATARAX/VISTARIL  Take 1 tablet (25 mg total) by mouth every 6 (six) hours as needed for anxiety.   Indication:  Anxiety Neurosis     traZODone 50 MG tablet  Commonly known as:  DESYREL  Take 1 tablet (50 mg total) by mouth at bedtime.   Indication:  Trouble Sleeping           Follow-up Information    Follow up with [facility] On 08/09/2015.   Why:  Saturday at 1:15 with the Dr.  I tried to call Ms Kellie Simmering, your therapist, at [place] [phone], to schedule another apppointment, but her VM was full and it disconnected me.  You will need to call yourself to make another appointment, or call the office   Contact information:   [place] [phone]       Follow-up recommendations:  Activity:  as tol Diet:  as tol  Comments:  1.  Take all your medications as prescribed.              2.  Report any adverse side effects to outpatient provider.                       3.  Patient instructed to not use alcohol or illegal drugs while on prescription medicines.            4.  In the event of worsening symptoms, instructed patient to call 911, the crisis hotline or go to nearest emergency room for evaluation of symptoms.  Total Discharge Time: 40 min  Signed: Freda Munro May Agustin AGNP-BC 07/26/2015, 11:11 AM   Patient seen chart reviewed.  Patient is doing much better on her medication.  She is taking Haldol Decanoate however found to be hyperprolactinemia and Abilify was added to address that.  Her thinking is much clearer and she denies any suicidal thoughts or homicidal thought.  She like to continue her treatment and follow-up at ACT services.  She has no side effects.  Patient is aware that she is taking 2 Anti-Psychotic medication.  She will discuss with her outpatient Psychiatrist to gradually taper off from 1 Anti-psychotic medicine.   I agreed with the findings, treatment and disposition plan of this  patient.  Berniece Andreas, MD

## 2015-09-15 ENCOUNTER — Encounter (HOSPITAL_COMMUNITY): Payer: Self-pay | Admitting: Emergency Medicine

## 2015-09-15 ENCOUNTER — Ambulatory Visit (HOSPITAL_COMMUNITY)
Admission: RE | Admit: 2015-09-15 | Discharge: 2015-09-15 | Disposition: A | Discharge status: Home / Self Care | Payer: Medicaid Other | Attending: Psychiatry | Admitting: Psychiatry

## 2015-09-15 ENCOUNTER — Emergency Department (HOSPITAL_COMMUNITY)
Admission: EM | Admit: 2015-09-15 | Discharge: 2015-09-16 | Payer: Medicaid Other | Attending: Emergency Medicine | Admitting: Emergency Medicine

## 2015-09-15 DIAGNOSIS — Z8619 Personal history of other infectious and parasitic diseases: Secondary | ICD-10-CM | POA: Insufficient documentation

## 2015-09-15 DIAGNOSIS — F22 Delusional disorders: Secondary | ICD-10-CM | POA: Insufficient documentation

## 2015-09-15 DIAGNOSIS — F419 Anxiety disorder, unspecified: Secondary | ICD-10-CM | POA: Insufficient documentation

## 2015-09-15 DIAGNOSIS — Z3A00 Weeks of gestation of pregnancy not specified: Secondary | ICD-10-CM | POA: Insufficient documentation

## 2015-09-15 DIAGNOSIS — F309 Manic episode, unspecified: Secondary | ICD-10-CM | POA: Insufficient documentation

## 2015-09-15 DIAGNOSIS — F1420 Cocaine dependence, uncomplicated: Secondary | ICD-10-CM | POA: Diagnosis present

## 2015-09-15 DIAGNOSIS — Z79899 Other long term (current) drug therapy: Secondary | ICD-10-CM | POA: Diagnosis not present

## 2015-09-15 DIAGNOSIS — F250 Schizoaffective disorder, bipolar type: Secondary | ICD-10-CM | POA: Insufficient documentation

## 2015-09-15 DIAGNOSIS — Z3490 Encounter for supervision of normal pregnancy, unspecified, unspecified trimester: Secondary | ICD-10-CM

## 2015-09-15 DIAGNOSIS — R45851 Suicidal ideations: Secondary | ICD-10-CM | POA: Diagnosis not present

## 2015-09-15 DIAGNOSIS — R451 Restlessness and agitation: Secondary | ICD-10-CM | POA: Insufficient documentation

## 2015-09-15 DIAGNOSIS — O99340 Other mental disorders complicating pregnancy, unspecified trimester: Secondary | ICD-10-CM | POA: Diagnosis present

## 2015-09-15 LAB — CBC
HCT: 43.8 % (ref 36.0–46.0)
Hemoglobin: 15 g/dL (ref 12.0–15.0)
MCH: 30.7 pg (ref 26.0–34.0)
MCHC: 34.2 g/dL (ref 30.0–36.0)
MCV: 89.8 fL (ref 78.0–100.0)
PLATELETS: 222 10*3/uL (ref 150–400)
RBC: 4.88 MIL/uL (ref 3.87–5.11)
RDW: 13.5 % (ref 11.5–15.5)
WBC: 10.9 10*3/uL — AB (ref 4.0–10.5)

## 2015-09-15 LAB — COMPREHENSIVE METABOLIC PANEL
ALK PHOS: 64 U/L (ref 38–126)
ALT: 17 U/L (ref 14–54)
AST: 29 U/L (ref 15–41)
Albumin: 4.6 g/dL (ref 3.5–5.0)
Anion gap: 10 (ref 5–15)
BILIRUBIN TOTAL: 0.6 mg/dL (ref 0.3–1.2)
BUN: 5 mg/dL — AB (ref 6–20)
CHLORIDE: 104 mmol/L (ref 101–111)
CO2: 23 mmol/L (ref 22–32)
Calcium: 9.5 mg/dL (ref 8.9–10.3)
Creatinine, Ser: 0.86 mg/dL (ref 0.44–1.00)
Glucose, Bld: 115 mg/dL — ABNORMAL HIGH (ref 65–99)
Potassium: 3.3 mmol/L — ABNORMAL LOW (ref 3.5–5.1)
Sodium: 137 mmol/L (ref 135–145)
Total Protein: 7.8 g/dL (ref 6.5–8.1)

## 2015-09-15 LAB — ACETAMINOPHEN LEVEL: Acetaminophen (Tylenol), Serum: <10 ug/mL — ABNORMAL LOW (ref 10–30)

## 2015-09-15 LAB — SALICYLATE LEVEL: Salicylate Lvl: <4 mg/dL (ref 2.8–30.0)

## 2015-09-15 LAB — I-STAT BETA HCG BLOOD, ED (MC, WL, AP ONLY)

## 2015-09-15 LAB — ETHANOL

## 2015-09-15 MED ORDER — LORAZEPAM 2 MG/ML IJ SOLN
1.0000 mg | Freq: Once | INTRAMUSCULAR | Status: AC
  Administered 2015-09-15: 1 mg via INTRAMUSCULAR
  Filled 2015-09-15: qty 1

## 2015-09-15 MED ORDER — DIPHENHYDRAMINE HCL 25 MG PO CAPS
50.0000 mg | ORAL_CAPSULE | Freq: Every evening | ORAL | Status: DC | PRN
  Administered 2015-09-15: 50 mg via ORAL
  Filled 2015-09-15: qty 2

## 2015-09-15 MED ORDER — ACETAMINOPHEN 325 MG PO TABS
650.0000 mg | ORAL_TABLET | ORAL | Status: DC | PRN
  Administered 2015-09-15: 650 mg via ORAL
  Filled 2015-09-15: qty 2

## 2015-09-15 MED ORDER — NICOTINE 21 MG/24HR TD PT24: 21.0000 mg | MEDICATED_PATCH | Freq: Every day | TRANSDERMAL | Status: DC

## 2015-09-15 NOTE — ED Notes (Signed)
Patient became tearful and laid down in the floor in hall. Encouragement offered. Patient made statements about a cartel coming to get her when she noticed another patient being transferred. Patient agreed to take a shower at present.   Q 15 safety checks continue.

## 2015-09-15 NOTE — ED Provider Notes (Signed)
CSN: [id number]     Arrival date & time 09/15/15  1706 History   First MD Initiated Contact with Patient 09/15/15 1739     Chief Complaint  Patient presents with  . Medical Clearance     (Consider location/radiation/quality/duration/timing/severity/associated sxs/prior Treatment) HPI   29 year old female with history of bipolar anxiety brought here via [employer] for evaluation of delusional behaviors. Patient was brought here because mom reports patient has been acting erratic, aggressive, and verbally abusive to everyone. I was able to get a good history from patient. Patient mentioned that she has not been on her psychiatric medication for the past 2 months due to lack of money. Since then she has been feeling increasingly agitated, and feels that she needs to be back on medication. She admits that she is self-medicating with alcohol, marijuana and moly, last used today.  Patient states she is eating and sleeping well. She denies homicidal or suicidal ideation and denies auditory or visual sedation. She has no other complaint.  Past Medical History  Diagnosis Date  . Anxiety   . Bipolar disorder (HCC)   . Vaginal Pap smear, abnormal   . Gonorrhea   . Chlamydia    Past Surgical History  Procedure Laterality Date  . Dilation and curettage of uterus    . Colposcopy     Family History  Problem Relation Age of Onset  . Alcoholism Mother    Social History  Substance Use Topics  . Smoking status: Never Smoker   . Smokeless tobacco: None  . Alcohol Use: 0.6 oz/week    1 Glasses of wine per week     Comment: socially   OB History    Gravida Para Term Preterm AB TAB SAB Ectopic Multiple Living   4 1 1  2 2    1      Review of Systems  All other systems reviewed and are negative.     Allergies  Review of patient's allergies indicates no known allergies.  Home Medications   Prior to Admission medications   Medication Sig Start Date End Date Taking? Authorizing Provider   ARIPiprazole (ABILIFY) 5 MG tablet Take 1 tablet (5 mg total) by mouth at bedtime. 07/26/15   Adonis BrookSheila Agustin, NP  benztropine (COGENTIN) 0.5 MG tablet Take 1 tablet (0.5 mg total) by mouth 2 (two) times daily. 07/26/15   Adonis BrookSheila Agustin, NP  carbamazepine (TEGRETOL XR) 200 MG 12 hr tablet Take 1 tablet (200 mg total) by mouth 2 (two) times daily. 07/26/15   Adonis BrookSheila Agustin, NP  haloperidol (HALDOL) 5 MG tablet Take 1 tablet (5 mg total) by mouth every morning. 07/26/15   Adonis BrookSheila Agustin, NP  haloperidol decanoate (HALDOL DECANOATE) 100 MG/ML injection Inject 0.75 mLs (75 mg total) into the muscle every 30 (thirty) days. Last dose was given 07/25/2015.  Next dose due 08/24/2015. 07/26/15   Adonis BrookSheila Agustin, NP  hydrOXYzine (ATARAX/VISTARIL) 25 MG tablet Take 1 tablet (25 mg total) by mouth every 6 (six) hours as needed for anxiety. 07/26/15   Adonis BrookSheila Agustin, NP  traZODone (DESYREL) 50 MG tablet Take 1 tablet (50 mg total) by mouth at bedtime. 07/26/15   Adonis BrookSheila Agustin, NP   BP 102/62 mmHg  Pulse 93  Temp(Src) 97.7 F (36.5 C) (Oral)  Resp 18  SpO2 99% Physical Exam  Constitutional: She appears well-developed and well-nourished. No distress.  African-American female sitting in a chair handcuffed.  nontoxic  HENT:  Head: Atraumatic.  Mouth/Throat: Oropharynx is clear and moist.  Eyes:  Conjunctivae are normal.  Neck: Neck supple.  Cardiovascular: Normal rate and regular rhythm.   Pulmonary/Chest: Effort normal and breath sounds normal.  Abdominal: Soft. There is no tenderness.  Neurological: She is alert. GCS eye subscore is 4. GCS verbal subscore is 5. GCS motor subscore is 6.  Skin: No rash noted.  Psychiatric: Her affect is labile. Thought content is delusional. Thought content is not paranoid. She expresses no homicidal and no suicidal ideation.  Cooperative and polite but can be aggressive at times  Nursing note and vitals reviewed.   ED Course  Procedures (including critical care time)  Patient  brought here due to delusional and aggressive behaviors. History of bipolar not been on her medication for the past 2 months. Suspect patient is in a manic state. She also tested positive for pregnancy. Patient states she is aware that she is pregnant and would like to have an abortion. She is unable to recall her last menstrual period.  7:12 PM Since pt is currently pregnant, i am hesitant of restarting her psychiatric medication.  She is not SI/HI and currently cooperative, therefore I will consult TTS for further management of her condition.  Care discussed with Dr. Estell Harpin.    12:57 AM At this time pt does not have any medical condition that can emergently preclude psychiatric assessment.    Labs Review Labs Reviewed  COMPREHENSIVE METABOLIC PANEL - Abnormal; Notable for the following:    Potassium 3.3 (*)    Glucose, Bld 115 (*)    BUN 5 (*)    All other components within normal limits  ACETAMINOPHEN LEVEL - Abnormal; Notable for the following:    Acetaminophen (Tylenol), Serum <10 (*)    All other components within normal limits  CBC - Abnormal; Notable for the following:    WBC 10.9 (*)    All other components within normal limits  I-STAT BETA HCG BLOOD, ED ([facility], [facility], [facility] ONLY) - Abnormal; Notable for the following:    I-stat hCG, quantitative >2000.0 (*)    All other components within normal limits  ETHANOL  SALICYLATE LEVEL  URINE RAPID DRUG SCREEN, HOSP PERFORMED    Imaging Review No results found. I have personally reviewed and evaluated these images and lab results as part of my medical decision-making.   EKG Interpretation None      MDM   Final diagnoses:  Schizoaffective disorder, bipolar type (HCC)  Mania (HCC)  Pregnant    BP 112/81 mmHg  Pulse 76  Temp(Src) 98.6 F (37 C) (Oral)  Resp 18  SpO2 98%     Fayrene Helper, PA-C 09/16/15 1610  Bethann Berkshire, MD 09/18/15 1512
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
## 2015-09-15 NOTE — ED Notes (Signed)
Continues to pace. Repeatedly asking for towels and wash cloth. Threatened to break the glass at the nurses' station.

## 2015-09-15 NOTE — ED Notes (Signed)
Per police, [facility]patient acting erratic, delusional-meds recently changed-

## 2015-09-15 NOTE — ED Notes (Signed)
Patient noncompliant with the triage process-came at RN-[employer] intervened-patient in [employer] cuffs

## 2015-09-15 NOTE — ED Notes (Signed)
Patient is agitated, wants to leave. Wrestled with Berna SpareMarcus to get into his office door. Patient also slammed hand against nursing station window.

## 2015-09-15 NOTE — BH Assessment (Signed)
[facility] Assessment Progress Note   Patient verbally aggressive with clinician.  Calling names and making threats.  Pt attempted to push door open to get into clinical office, was able to be verbally redirected.

## 2015-09-15 NOTE — ED Notes (Signed)
Patient continues to pace and make threats to staff. Insisting she has been here 24hrs and that is all [place] requires of her. [facility] or she will murder "everyone in here".

## 2015-09-15 NOTE — ED Notes (Signed)
Patient agitated. Making verbal threats to nursing staff. Appears to be responding to internal stimuli. Pacing, making demands.  Donell SievertSpencer Simon PA made aware of patient status. Will continue to monitor and attempt to redirect. Encouragement offered.  Q 15 safety checks continue.

## 2015-09-15 NOTE — ED Notes (Signed)
Patient ambulated back to [facility]PU from Triage.  Irritable on first contact.  She would not speak with me.  She then jumped out of bed and requested to use the phone.  I showed her where the phone was and instructed her on how to place a call.  She refused to answer any of my questions.

## 2015-09-16 DIAGNOSIS — R45851 Suicidal ideations: Secondary | ICD-10-CM | POA: Diagnosis not present

## 2015-09-16 DIAGNOSIS — F250 Schizoaffective disorder, bipolar type: Secondary | ICD-10-CM

## 2015-09-16 DIAGNOSIS — F1420 Cocaine dependence, uncomplicated: Secondary | ICD-10-CM

## 2015-09-16 MED ORDER — LORAZEPAM 2 MG/ML IJ SOLN
2.0000 mg | Freq: Once | INTRAMUSCULAR | Status: AC
  Administered 2015-09-16: 2 mg via INTRAMUSCULAR
  Filled 2015-09-16: qty 1

## 2015-09-16 MED ORDER — POTASSIUM CHLORIDE CRYS ER 20 MEQ PO TBCR
20.0000 meq | EXTENDED_RELEASE_TABLET | Freq: Once | ORAL | Status: AC
  Administered 2015-09-16: 20 meq via ORAL
  Filled 2015-09-16: qty 1

## 2015-09-16 MED ORDER — LORAZEPAM 1 MG PO TABS: 2.0000 mg | ORAL_TABLET | Freq: Once | ORAL | Status: AC

## 2015-09-16 MED ORDER — OLANZAPINE 10 MG PO TBDP: 10.0000 mg | ORAL_TABLET | Freq: Two times a day (BID) | ORAL | Status: DC

## 2015-09-16 NOTE — Progress Notes (Addendum)
Pending review for possible placement with [facility]. H&P and Assessment have been faxed to the [facility] for the charge nurse to review.    09/16/2015 Nicole Watson Robarge, MS, NCC, LPCA Therapeutic Triage Specialist     

## 2015-09-16 NOTE — BH Assessment (Addendum)
[facility] Assessment Progress Note  Per Thedore MinsMojeed Akintayo, MD, this pt requires psychiatric hospitalization at this time.  Placement is to be sought for her at a psychiatric facility.  At 12:25 Albin FellingCarla [facility].  Pt has been accepted to their facility by Dr Jeannine KittenFarah after 15:00 today.  Please call report to [phone].  Juliette AlcideMae Agustin, NP concurs with this decision.  Pt's nurse has been notified.  Pt is under IVC and is to be transported via [employer] examinerlaw enforcement.  Doylene Canninghomas Shanica Castellanos, MA Triage Specialist [phone]

## 2015-09-16 NOTE — Consult Note (Addendum)
[facility] Psychiatry Consult   Reason for Consult:  Delusional behaviors Referring Physician:  New Site provider Patient Identification: Makayla Medina MRN:  [id number] Principal Diagnosis: Schizoaffective disorder, bipolar type [facility]) Diagnosis:   Patient Active Problem List   Diagnosis Date Noted  . 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 Schizoaffective disorder, bipolar type ([place]) [F25.0] 07/23/2015  . Cannabis use disorder, moderate, dependence ([place]) [F12.20] 07/23/2015  . Tobacco use disorder [F17.200] 07/23/2015  . Cocaine use disorder, severe, dependence ([place]) [F14.20] 07/22/2015    Total Time spent with patient: 45 minutes  Subjective:   Makayla Medina is a 29 y.o. female patient admitted with bizzarre behavior.  HPI:  Makayla Medina, 29 year old female with history of bipolar anxiety brought here via [employer] for evaluation of delusional behaviors.  Per TTS note, patient was brought here because mom reports patient has been acting erratic, aggressive, and verbally abusive to everyone.  Patient reported that she had not been on her psychiatric medication for the past 2 months due to financial constraints.  She reports increased agitation and insomnia.  She admits that she is self-medicating with alcohol, marijuana and Cloyde Reams, last used today. Patient states she is eating and sleeping well.   Today patient was seen.  Initially, she was calm.  Tearful when asked about her pregnancy.  She denied homicidal or suicidal ideation.  She denied auditory or visual sedation.  She then became agitated and focused on a staff member.  She began to curse and demand for blanket.  Nursing staff present and helping patient.     Past Psychiatric History: Bipolar disorder  Risk to Self: Is patient at risk for suicide?: No, but patient needs Medical Clearance Risk to Others:   Prior Inpatient Therapy:   Prior Outpatient Therapy:    Past Medical History:  Past Medical History   Diagnosis Date  . Anxiety   . Bipolar disorder ([place])   . Vaginal Pap smear, abnormal   . Gonorrhea   . Chlamydia     Past Surgical History  Procedure Laterality Date  . Dilation and curettage of uterus    . Colposcopy     Family History:  Family History  Problem Relation Age of Onset  . Alcoholism Mother    Family Psychiatric  History: Denies Social History:  History  Alcohol Use  . 0.6 oz/week  . 1 Glasses of wine per week    Comment: socially     History  Drug Use  . Yes  . Special: Marijuana, Cocaine    Comment: 3 days ago. Uses once a week.    Social History   Social History  . Marital Status: Single    Spouse Name: N/A  . Number of Children: N/A  . Years of Education: N/A   Social History Main Topics  . Smoking status: Never Smoker   . Smokeless tobacco: None  . Alcohol Use: 0.6 oz/week    1 Glasses of wine per week     Comment: socially  . Drug Use: Yes    Special: Marijuana, Cocaine     Comment: 3 days ago. Uses once a week.  Marland Kitchen Sexual Activity: Yes     Comment: pt will niot answer   Other Topics Concern  . None   Social History Narrative   Additional Social History:     Allergies:  No Known Allergies  Labs:  Results for orders placed or performed during the hospital encounter of 09/15/15 (from the past 48 hour(s))  Ethanol (ETOH)  Status: None   Collection Time: 09/15/15  5:19 PM  Result Value Ref Range   Alcohol, Ethyl (B) <5 <5 mg/dL    Comment:        LOWEST DETECTABLE LIMIT FOR SERUM ALCOHOL IS 5 mg/dL FOR MEDICAL PURPOSES ONLY   Salicylate level     Status: None   Collection Time: 09/15/15  5:19 PM  Result Value Ref Range   Salicylate Lvl <3.6 2.8 - 30.0 mg/dL  Acetaminophen level     Status: Abnormal   Collection Time: 09/15/15  5:19 PM  Result Value Ref Range   Acetaminophen (Tylenol), Serum <10 (L) 10 - 30 ug/mL    Comment:        THERAPEUTIC CONCENTRATIONS VARY SIGNIFICANTLY. A RANGE OF 10-30 ug/mL MAY BE AN  EFFECTIVE CONCENTRATION FOR MANY PATIENTS. HOWEVER, SOME ARE BEST TREATED AT CONCENTRATIONS OUTSIDE THIS RANGE. ACETAMINOPHEN CONCENTRATIONS >150 ug/mL AT 4 HOURS AFTER INGESTION AND >50 ug/mL AT 12 HOURS AFTER INGESTION ARE OFTEN ASSOCIATED WITH TOXIC REACTIONS.   Comprehensive metabolic panel     Status: Abnormal   Collection Time: 09/15/15  5:20 PM  Result Value Ref Range   Sodium 137 135 - 145 mmol/L   Potassium 3.3 (L) 3.5 - 5.1 mmol/L   Chloride 104 101 - 111 mmol/L   CO2 23 22 - 32 mmol/L   Glucose, Bld 115 (H) 65 - 99 mg/dL   BUN 5 (L) 6 - 20 mg/dL   Creatinine, Ser 0.86 0.44 - 1.00 mg/dL   Calcium 9.5 8.9 - 10.3 mg/dL   Total Protein 7.8 6.5 - 8.1 g/dL   Albumin 4.6 3.5 - 5.0 g/dL   AST 29 15 - 41 U/L   ALT 17 14 - 54 U/L   Alkaline Phosphatase 64 38 - 126 U/L   Total Bilirubin 0.6 0.3 - 1.2 mg/dL   GFR calc non Af Amer >60 >60 mL/min   GFR calc Af Amer >60 >60 mL/min    Comment: (NOTE) The eGFR has been calculated using the CKD EPI equation. This calculation has not been validated in all clinical situations. eGFR's persistently <60 mL/min signify possible Chronic Kidney Disease.    Anion gap 10 5 - 15  CBC     Status: Abnormal   Collection Time: 09/15/15  5:20 PM  Result Value Ref Range   WBC 10.9 (H) 4.0 - 10.5 K/uL   RBC 4.88 3.87 - 5.11 MIL/uL   Hemoglobin 15.0 12.0 - 15.0 g/dL   HCT 43.8 36.0 - 46.0 %   MCV 89.8 78.0 - 100.0 fL   MCH 30.7 26.0 - 34.0 pg   MCHC 34.2 30.0 - 36.0 g/dL   RDW 13.5 11.5 - 15.5 %   Platelets 222 150 - 400 K/uL  I-Stat beta hCG blood, ED ([facility], [facility], [facility] only)     Status: Abnormal   Collection Time: 09/15/15  5:29 PM  Result Value Ref Range   I-stat hCG, quantitative >2000.0 (H) <5 mIU/mL   Comment 3            Comment:   GEST. AGE      CONC.  (mIU/mL)   <=1 WEEK        5 - 50     2 WEEKS       50 - 500     3 WEEKS       100 - 10,000     4 WEEKS     1,000 - 30,000  FEMALE AND NON-PREGNANT FEMALE:     LESS THAN 5  mIU/mL     Current Facility-Administered Medications  Medication Dose Route Frequency Provider Last Rate Last Dose  . acetaminophen (TYLENOL) tablet 650 mg  650 mg Oral Q4H PRN Domenic Moras, PA-C   650 mg at 09/15/15 2302  . diphenhydrAMINE (BENADRYL) capsule 50 mg  50 mg Oral QHS PRN Orpah Greek, MD   50 mg at 09/15/15 2000  . LORazepam (ATIVAN) tablet 2 mg  2 mg Oral Once Kerrie Buffalo, NP       Or  . LORazepam (ATIVAN) injection 2 mg  2 mg Intramuscular Once Kerrie Buffalo, NP      . nicotine (NICODERM CQ - dosed in mg/24 hours) patch 21 mg  21 mg Transdermal Daily Domenic Moras, PA-C   Stopped at 09/15/15 2006  . OLANZapine zydis (ZYPREXA) disintegrating tablet 10 mg  10 mg Oral BID PC Analiza Cowger       Current Outpatient Prescriptions  Medication Sig Dispense Refill  . ARIPiprazole (ABILIFY) 5 MG tablet Take 1 tablet (5 mg total) by mouth at bedtime. (Patient not taking: Reported on 09/15/2015) 30 tablet 0  . benztropine (COGENTIN) 0.5 MG tablet Take 1 tablet (0.5 mg total) by mouth 2 (two) times daily. (Patient not taking: Reported on 09/15/2015) 60 tablet 0  . carbamazepine (TEGRETOL XR) 200 MG 12 hr tablet Take 1 tablet (200 mg total) by mouth 2 (two) times daily. (Patient not taking: Reported on 09/15/2015) 60 tablet 0  . haloperidol (HALDOL) 5 MG tablet Take 1 tablet (5 mg total) by mouth every morning. (Patient not taking: Reported on 09/15/2015) 30 tablet 0  . haloperidol decanoate (HALDOL DECANOATE) 100 MG/ML injection Inject 0.75 mLs (75 mg total) into the muscle every 30 (thirty) days. Last dose was given 07/25/2015.  Next dose due 08/24/2015. (Patient not taking: Reported on 09/15/2015) 1 mL 0  . hydrOXYzine (ATARAX/VISTARIL) 25 MG tablet Take 1 tablet (25 mg total) by mouth every 6 (six) hours as needed for anxiety. (Patient not taking: Reported on 09/15/2015) 30 tablet 0  . traZODone (DESYREL) 50 MG tablet Take 1 tablet (50 mg total) by mouth at bedtime. (Patient not  taking: Reported on 09/15/2015) 30 tablet 0    Musculoskeletal: Strength & Muscle Tone: within normal limits Gait & Station: normal Patient leans: N/A  Psychiatric Specialty Exam: Review of Systems  Psychiatric/Behavioral: Positive for depression and suicidal ideas. The patient is nervous/anxious.   All other systems reviewed and are negative.   Blood pressure 109/67, pulse 72, temperature 98 F (36.7 C), temperature source Oral, resp. rate 21, SpO2 98 %, unknown if currently breastfeeding.There is no weight on file to calculate BMI.  General Appearance: Disheveled  Eye Contact::  Good  Speech:  Pressured  Volume:  Increased  Mood:  Angry, Anxious and Irritable  Affect:  Depressed, Inappropriate, Labile, Restricted and Tearful  Thought Process:  Disorganized and Loose  Orientation:  Full (Time, Place, and Person)  Thought Content:  Rumination  Suicidal Thoughts:  Yes.  without intent/plan  Homicidal Thoughts:  No  Memory:  Immediate;   Fair Recent;   Fair Remote;   Fair  Judgement:  Impaired  Insight:  Lacking  Psychomotor Activity:  Normal  Concentration:  Good  Recall:  Good  Fund of Knowledge:Good  Language: Good  Akathisia:  NA  Handed:  Right  AIMS (if indicated):     Assets:  Resilience  ADL's:  Intact  Cognition:  WNL  Sleep:   poor   Treatment Plan Summary: Admit for crisis management and mood stabilization.  Bed available at [facility].  Dr Sheppard Evens accepting MD. Medication management to re-stabilize current mood symptoms Group counseling sessions for coping skills Medical consults as needed Review and reinstate any pertinent home medications for other health problems   Disposition: Recommend psychiatric Inpatient admission when medically cleared. Supportive therapy provided about ongoing stressors. [facility].  Dr Sheppard Evens.  Freda Munro May Agustin AGNP-BC 09/16/2015 1:23 PM Patient seen face-to-face for psychiatric evaluation, chart  reviewed and case discussed with the physician extender and developed treatment plan. Reviewed the information documented and agree with the treatment plan. Corena Pilgrim, MD

## 2015-09-27 ENCOUNTER — Emergency Department (HOSPITAL_COMMUNITY)
Admission: EM | Admit: 2015-09-27 | Discharge: 2015-09-27 | Disposition: A | Discharge status: Home / Self Care | Payer: Medicaid Other | Attending: Emergency Medicine | Admitting: Emergency Medicine

## 2015-09-27 ENCOUNTER — Encounter (HOSPITAL_COMMUNITY): Payer: Self-pay | Admitting: *Deleted

## 2015-09-27 DIAGNOSIS — H1033 Unspecified acute conjunctivitis, bilateral: Secondary | ICD-10-CM | POA: Insufficient documentation

## 2015-09-27 DIAGNOSIS — Z8619 Personal history of other infectious and parasitic diseases: Secondary | ICD-10-CM | POA: Insufficient documentation

## 2015-09-27 DIAGNOSIS — H578 Other specified disorders of eye and adnexa: Secondary | ICD-10-CM | POA: Diagnosis present

## 2015-09-27 DIAGNOSIS — Z8659 Personal history of other mental and behavioral disorders: Secondary | ICD-10-CM | POA: Insufficient documentation

## 2015-09-27 MED ORDER — TETRACAINE HCL 0.5 % OP SOLN
2.0000 [drp] | Freq: Once | OPHTHALMIC | Status: AC
  Administered 2015-09-27: 2 [drp] via OPHTHALMIC
  Filled 2015-09-27: qty 2

## 2015-09-27 MED ORDER — OFLOXACIN 0.3 % OP SOLN
2.0000 [drp] | Freq: Four times a day (QID) | OPHTHALMIC | Status: DC
  Administered 2015-09-27: 2 [drp] via OPHTHALMIC
  Filled 2015-09-27: qty 5

## 2015-09-27 MED ORDER — FLUORESCEIN SODIUM 1 MG OP STRP
2.0000 | ORAL_STRIP | Freq: Once | OPHTHALMIC | Status: AC
  Administered 2015-09-27: 2 via OPHTHALMIC
  Filled 2015-09-27: qty 2

## 2015-09-27 MED ORDER — ACETAMINOPHEN 500 MG PO TABS
1000.0000 mg | ORAL_TABLET | Freq: Once | ORAL | Status: AC
  Administered 2015-09-27: 1000 mg via ORAL
  Filled 2015-09-27: qty 2

## 2015-09-27 NOTE — Discharge Instructions (Signed)
1. Medications: Ofloxaxin - use 2 drops 4x per day for 7 days, usual home medications 2. Treatment: rest, drink plenty of fluids, cool compresses to help with eye irritation, wash hands frequently 3. Follow Up: Please followup with ophthalmology in 2 days for further evaluation of your eye infection; Please return to the ER for vision changes, high fevers, worsening of the swelling of your eyes   Bacterial Conjunctivitis Bacterial conjunctivitis, commonly called pink eye, is an inflammation of the clear membrane that covers the white part of the eye (conjunctiva). The inflammation can also happen on the underside of the eyelids. The blood vessels in the conjunctiva become inflamed, causing the eye to become red or pink. Bacterial conjunctivitis may spread easily from one eye to another and from person to person (contagious).  CAUSES  Bacterial conjunctivitis is caused by bacteria. The bacteria may come from your own skin, your upper respiratory tract, or from someone else with bacterial conjunctivitis. SYMPTOMS  The normally white color of the eye or the underside of the eyelid is usually pink or red. The pink eye is usually associated with irritation, tearing, and some sensitivity to light. Bacterial conjunctivitis is often associated with a thick, yellowish discharge from the eye. The discharge may turn into a crust on the eyelids overnight, which causes your eyelids to stick together. If a discharge is present, there may also be some blurred vision in the affected eye. DIAGNOSIS  Bacterial conjunctivitis is diagnosed by your caregiver through an eye exam and the symptoms that you report. Your caregiver looks for changes in the surface tissues of your eyes, which may point to the specific type of conjunctivitis. A sample of any discharge may be collected on a cotton-tip swab if you have a severe case of conjunctivitis, if your cornea is affected, or if you keep getting repeat infections that do not  respond to treatment. The sample will be sent to a lab to see if the inflammation is caused by a bacterial infection and to see if the infection will respond to antibiotic medicines. TREATMENT   Bacterial conjunctivitis is treated with antibiotics. Antibiotic eyedrops are most often used. However, antibiotic ointments are also available. Antibiotics pills are sometimes used. Artificial tears or eye washes may ease discomfort. HOME CARE INSTRUCTIONS   To ease discomfort, apply a cool, clean washcloth to your eye for 10-20 minutes, 3-4 times a day.  Gently wipe away any drainage from your eye with a warm, wet washcloth or a cotton ball.  Wash your hands often with soap and water. Use paper towels to dry your hands.  Do not share towels or washcloths. This may spread the infection.  Change or wash your pillowcase every day.  You should not use eye makeup until the infection is gone.  Do not operate machinery or drive if your vision is blurred.  Stop using contact lenses. Ask your caregiver how to sterilize or replace your contacts before using them again. This depends on the type of contact lenses that you use.  When applying medicine to the infected eye, do not touch the edge of your eyelid with the eyedrop bottle or ointment tube. SEEK IMMEDIATE MEDICAL CARE IF:   Your infection has not improved within 3 days after beginning treatment.  You had yellow discharge from your eye and it returns.  You have increased eye pain.  Your eye redness is spreading.  Your vision becomes blurred.  You have a fever or persistent symptoms for more than 2-3  days.  You have a fever and your symptoms suddenly get worse.  You have facial pain, redness, or swelling. MAKE SURE YOU:   Understand these instructions.  Will watch your condition.  Will get help right away if you are not doing well or get worse.   This information is not intended to replace advice given to you by your health care  provider. Make sure you discuss any questions you have with your health care provider.   Document Released: 11/08/2005 Document Revised: 11/29/2014 Document Reviewed: 04/10/2012 Elsevier Interactive Patient Education [employer]2016 Elsevier Inc.

## 2015-09-27 NOTE — ED Notes (Signed)
PT returns with on going eye infection. Both eyes are swollen  And drainage. Pt is pacing the room and agitated.

## 2015-09-27 NOTE — ED Provider Notes (Signed)
CSN: [id number]     Arrival date & time 09/27/15  0906 History  By signing my name below, I, Placido SouLogan Joldersma, attest that this documentation has been prepared under the direction and in the presence of [employer] Corp[employer]annah Kieana Livesay, PA-C. Electronically Signed: Placido SouLogan Joldersma, ED Scribe. 09/27/2015. 10:36 AM.   Chief Complaint  Patient presents with  . Eye Drainage    RT and LT    The history is provided by the patient and medical records. No language interpreter was used.    HPI Comments: Makayla Medina is a 29 y.o. female who presents to the Emergency Department complaining of constant, moderate, bilateral eye irritation with onset 1 week ago. Pt notes that she was seen for the same symptoms at [facility] 3 days ago and was given both saline and eye drops during her visit which provided relief at the time but denies having been prescribed anything for ongoing relief. She notes associated photophobia, swelling, redness and discharge. Pt notes currently being pregnant. She denies fevers, chills, vision changes, headache, neck stiffness and sinus pressure.   Pt is pregnant.  Denies abd pain, N/V, vaginal bleeding.    Past Medical History  Diagnosis Date  . Anxiety   . Bipolar disorder (HCC)   . Vaginal Pap smear, abnormal   . Gonorrhea   . Chlamydia    Past Surgical History  Procedure Laterality Date  . Dilation and curettage of uterus    . Colposcopy     Family History  Problem Relation Age of Onset  . Alcoholism Mother    Social History  Substance Use Topics  . Smoking status: Never Smoker   . Smokeless tobacco: None  . Alcohol Use: 0.6 oz/week    1 Glasses of wine per week     Comment: socially   OB History    Gravida Para Term Preterm AB TAB SAB Ectopic Multiple Living   4 1 1  2 2    1      Review of Systems  Constitutional: Negative for fever, chills, diaphoresis, appetite change, fatigue and unexpected weight change.  HENT: Negative for mouth sores and sinus  pressure.   Eyes: Positive for photophobia, pain, discharge, redness and visual disturbance.  Respiratory: Negative for cough, chest tightness, shortness of breath and wheezing.   Cardiovascular: Negative for chest pain.  Gastrointestinal: Negative for nausea, vomiting, abdominal pain, diarrhea and constipation.  Endocrine: Negative for polydipsia, polyphagia and polyuria.  Genitourinary: Negative for dysuria, urgency, frequency and hematuria.  Musculoskeletal: Negative for back pain and neck stiffness.  Skin: Negative for rash.  Allergic/Immunologic: Negative for immunocompromised state.  Neurological: Negative for syncope, light-headedness and headaches.  Hematological: Does not bruise/bleed easily.  Psychiatric/Behavioral: Negative for sleep disturbance. The patient is not nervous/anxious.    Allergies  Review of patient's allergies indicates no known allergies.  Home Medications   Prior to Admission medications   Not on File   BP 101/70 mmHg  Pulse 88  Temp(Src) 98.6 F (37 C) (Oral)  Resp 18  Ht 5\' 7"  (1.702 m)  Wt 160 lb (72.576 kg)  BMI 25.05 kg/m2  SpO2 100% Physical Exam  Constitutional: She is oriented to person, place, and time. She appears well-developed and well-nourished. No distress.  HENT:  Head: Normocephalic and atraumatic.  Nose: Nose normal. No mucosal edema or rhinorrhea.  Mouth/Throat: Uvula is midline, oropharynx is clear and moist and mucous membranes are normal. No uvula swelling. No oropharyngeal exudate, posterior oropharyngeal edema, posterior oropharyngeal erythema or  tonsillar abscesses.  Eyes: Conjunctivae, EOM and lids are normal. Pupils are equal, round, and reactive to light. Lids are everted and swept, no foreign bodies found. Right eye exhibits no chemosis, no discharge and no exudate. No foreign body present in the right eye. Left eye exhibits no chemosis, no discharge and no exudate. No foreign body present in the left eye. Right conjunctiva  is not injected. Right conjunctiva has no hemorrhage. Left conjunctiva is not injected. Left conjunctiva has no hemorrhage.  Slit lamp exam:      The right eye shows no corneal abrasion, no corneal flare, no corneal ulcer, no foreign body, no fluorescein uptake and no anterior chamber bulge.       The left eye shows no corneal abrasion, no corneal flare, no corneal ulcer, no foreign body, no fluorescein uptake and no anterior chamber bulge.  Pupils equal round and reactive to light No vertical, horizontal or rotational nystagmus No Corneal abrasion noted no fluorescein uptake No visible foreign body No corneal flare, ulcer or dendritic staining  No herpetic lesions to the face or around the eye Purulent Drainage from both eyes  Visual Acuity:  Bilateral Distance: 20/50  R Distance: 20/100  L Distance: 20/50  Neck: Normal range of motion.  Full range of motion without pain No midline or paraspinal tenderness No nuchal rigidity; no meningeal signs  Cardiovascular: Normal rate, regular rhythm, normal heart sounds and intact distal pulses.   Pulmonary/Chest: Effort normal. No respiratory distress.  Abdominal: Soft. There is no tenderness.  Musculoskeletal: Normal range of motion.  Neurological: She is alert and oriented to person, place, and time.  Mental Status:  Alert, oriented, thought content appropriate. Speech fluent without evidence of aphasia. Able to follow 2 step commands without difficulty.   Cranial Nerves:  II:  Peripheral visual fields grossly normal, pupils equal, round, reactive to light III,IV, VI: ptosis not present, extra-ocular motions intact bilaterally  V,VII: smile symmetric, facial light touch sensation equal VIII: hearing grossly normal bilaterally  IX,X: gag reflex present  XI: bilateral shoulder shrug equal and strong XII: midline tongue extension   Skin: Skin is warm and dry. She is not diaphoretic. No erythema.  Psychiatric: She has a normal mood and  affect.  Nursing note and vitals reviewed.  ED Course  Procedures  DIAGNOSTIC STUDIES: Oxygen Saturation is 100% on RA, normal by my interpretation.    COORDINATION OF CARE: 10:14 AM Pt presents today due to bilateral eye irriation. Discussed next steps with pt including an eye exam and pt agreed to plan.   SLIT LAMP EXAM: Tetracaine 2 drops used.  Lids everted and swept for exam, no evidence of foreign body.  Conjunctivae: significant injection bilaterally Cornea: No evidence of abrasion, dendritic lesions or corneal ulcers EOM: full without diplopia  Pupils: PERRL  Fluorescein uptake: none  Patient tolerated procedure well without immediate complications.  10:22 AM Discussed next steps with pt including an rx for antibiotics and information regarding follow up with an eye specialist. Pt agreed to plan.   Labs Review Labs Reviewed - No data to display  Imaging Review No results found.   EKG Interpretation None      MDM   Final diagnoses:  Acute bacterial conjunctivitis of both eyes    Makayla File presents with symptoms consistent with bacterial conjunctivitis.  Purulent discharge on exam.  No corneal abrasions, entrapment, consensual photophobia, or dendritic staining with fluorescein study.  Presentation non-concerning for iritis, corneal abrasions, or HSV.  No  evidence of preseptal or orbital cellulitis.  Patient has had multiple previous unknown treatments in the last week. Pt is not a contact lens wearer.  Patient is pregnant. Discussed with pharmacy who recommends ofloxacin 2 drops, 4 times a day 7 days.  First drops instilled here in the emergency department and patient discharged home with her ofloxacin medication. Discussed at length the importance of adherence to the regimen. Personal hygiene and frequent handwashing discussed.  Patient advised to followup with ophthalmologist in 2 days for reevaluation in several days.  Patient verbalizes understanding and is  agreeable with discharge.  BP 101/70 mmHg  Pulse 88  Temp(Src) 98.6 F (37 C) (Oral)  Resp 18  Ht  (1.702 m)  Wt 160 lb (72.576 kg)  BMI 25.05 kg/m2  SpO2 100%  I personally performed the services described in this documentation, which was scribed in my presence. The recorded information has been reviewed and is accurate.       Dahlia Client Maleke Feria, PA-C 09/27/15 1136  Vanetta Mulders, MD 09/28/15 [phone]

## 2015-10-06 LAB — OB RESULTS CONSOLE GC/CHLAMYDIA
Chlamydia: NEGATIVE
Gonorrhea: NEGATIVE

## 2015-10-06 LAB — OB RESULTS CONSOLE RUBELLA ANTIBODY, IGM: Rubella: IMMUNE

## 2015-10-06 LAB — OB RESULTS CONSOLE HIV ANTIBODY (ROUTINE TESTING): HIV: NONREACTIVE

## 2015-10-06 LAB — OB RESULTS CONSOLE ANTIBODY SCREEN: ANTIBODY SCREEN: NEGATIVE

## 2015-10-06 LAB — OB RESULTS CONSOLE ABO/RH: RH Type: POSITIVE

## 2015-10-06 LAB — OB RESULTS CONSOLE HGB/HCT, BLOOD
HCT: 39 %
Hemoglobin: 14.1 g/dL

## 2015-10-06 LAB — OB RESULTS CONSOLE PLATELET COUNT: Platelets: 198 10*3/uL

## 2015-10-06 LAB — OB RESULTS CONSOLE HEPATITIS B SURFACE ANTIGEN: HEP B S AG: NEGATIVE

## 2015-10-06 LAB — OB RESULTS CONSOLE RPR: RPR: NONREACTIVE

## 2015-10-08 ENCOUNTER — Encounter: Payer: Self-pay | Admitting: *Deleted

## 2015-10-08 DIAGNOSIS — O0990 Supervision of high risk pregnancy, unspecified, unspecified trimester: Secondary | ICD-10-CM | POA: Insufficient documentation

## 2015-10-16 ENCOUNTER — Encounter (HOSPITAL_COMMUNITY): Payer: Self-pay | Admitting: Emergency Medicine

## 2015-10-16 ENCOUNTER — Emergency Department (HOSPITAL_COMMUNITY)
Admission: EM | Admit: 2015-10-16 | Discharge: 2015-10-16 | Disposition: A | Discharge status: Home / Self Care | Payer: Medicaid Other | Attending: Emergency Medicine | Admitting: Emergency Medicine

## 2015-10-16 DIAGNOSIS — K5900 Constipation, unspecified: Secondary | ICD-10-CM | POA: Insufficient documentation

## 2015-10-16 DIAGNOSIS — Z3A08 8 weeks gestation of pregnancy: Secondary | ICD-10-CM | POA: Insufficient documentation

## 2015-10-16 DIAGNOSIS — F309 Manic episode, unspecified: Secondary | ICD-10-CM | POA: Insufficient documentation

## 2015-10-16 DIAGNOSIS — Z3490 Encounter for supervision of normal pregnancy, unspecified, unspecified trimester: Secondary | ICD-10-CM

## 2015-10-16 DIAGNOSIS — O99611 Diseases of the digestive system complicating pregnancy, first trimester: Secondary | ICD-10-CM | POA: Diagnosis not present

## 2015-10-16 DIAGNOSIS — O2341 Unspecified infection of urinary tract in pregnancy, first trimester: Secondary | ICD-10-CM | POA: Diagnosis not present

## 2015-10-16 DIAGNOSIS — O9989 Other specified diseases and conditions complicating pregnancy, childbirth and the puerperium: Secondary | ICD-10-CM | POA: Diagnosis present

## 2015-10-16 DIAGNOSIS — F17200 Nicotine dependence, unspecified, uncomplicated: Secondary | ICD-10-CM | POA: Diagnosis not present

## 2015-10-16 DIAGNOSIS — Z8619 Personal history of other infectious and parasitic diseases: Secondary | ICD-10-CM | POA: Insufficient documentation

## 2015-10-16 DIAGNOSIS — N390 Urinary tract infection, site not specified: Secondary | ICD-10-CM

## 2015-10-16 DIAGNOSIS — O99341 Other mental disorders complicating pregnancy, first trimester: Secondary | ICD-10-CM | POA: Insufficient documentation

## 2015-10-16 LAB — URINALYSIS, ROUTINE W REFLEX MICROSCOPIC
Bilirubin Urine: NEGATIVE
GLUCOSE, UA: NEGATIVE mg/dL
Ketones, ur: NEGATIVE mg/dL
Nitrite: NEGATIVE
PH: 6.5 (ref 5.0–8.0)
PROTEIN: NEGATIVE mg/dL
Specific Gravity, Urine: 1.03 (ref 1.005–1.030)

## 2015-10-16 LAB — URINE MICROSCOPIC-ADD ON

## 2015-10-16 LAB — I-STAT BETA HCG BLOOD, ED (MC, WL, AP ONLY): I-stat hCG, quantitative: >2000 m[IU]/mL — ABNORMAL HIGH (ref <5)

## 2015-10-16 MED ORDER — NITROFURANTOIN MONOHYD MACRO 100 MG PO CAPS
100.0000 mg | ORAL_CAPSULE | Freq: Once | ORAL | Status: AC
  Administered 2015-10-16: 100 mg via ORAL
  Filled 2015-10-16: qty 1

## 2015-10-16 MED ORDER — ACETAMINOPHEN 500 MG PO TABS
1000.0000 mg | ORAL_TABLET | Freq: Once | ORAL | Status: AC
  Administered 2015-10-16: 1000 mg via ORAL
  Filled 2015-10-16: qty 2

## 2015-10-16 MED ORDER — NITROFURANTOIN MONOHYD MACRO 100 MG PO CAPS: 100.0000 mg | ORAL_CAPSULE | Freq: Two times a day (BID) | ORAL | Status: DC

## 2015-10-16 NOTE — ED Provider Notes (Signed)
CSN: [id number]     Arrival date & time 10/16/15  4098 History  By signing my name below, I, Soijett Blue, attest that this documentation has been prepared under the direction and in the presence of Kawanda Drumheller, MD. Electronically Signed: Soijett Blue, ED Scribe. 10/16/2015. 1:12 AM.   Chief Complaint  Patient presents with  . Threatened Miscarriage  . Manic Behavior      The history is provided by the patient. No language interpreter was used.     Makayla Medina is a 29 y.o. female with a medical hx of anxiety and bipolar disorder, who presents to the Emergency Department via [employer] complaining of abdominal pain x 2 days.  She reports that she is currently 2 months pregnant and her next appointment is 10/29/15. She has had her first [place] but she is unsure of when it happened but it was completed at [facility]. Per [employer], the pt didn't complain of abdominal pain until she was arrested by [employer] tonight.  Pt is having associated symptoms of nausea, vomiting x 2 episodes, vaginal discharge, diarrhea, and constipation. She notes that she has not tried any medications for the relief of her symptoms. She denies any other symptoms. She notes that this is her fifth pregnancy and that she only has 1 child. She is unsure of when her LMP was but June 17th is her EDD. Pt is a poor historian who is uncooperative.  Per chart review, it was found that the pt had her [place] completed at [facility] on 09/18/2015 that showed a live uterine gestation.    Past Medical History  Diagnosis Date  . Anxiety   . Bipolar disorder (HCC)   . Vaginal Pap smear, abnormal   . Gonorrhea   . Chlamydia    Past Surgical History  Procedure Laterality Date  . Dilation and curettage of uterus    . Colposcopy     Family History  Problem Relation Age of Onset  . Alcoholism Mother    Social History  Substance Use Topics  . Smoking status: Current Some Day Smoker  . Smokeless tobacco: None  .  Alcohol Use: 0.6 oz/week    1 Glasses of wine per week     Comment: socially   OB History    Gravida Para Term Preterm AB TAB SAB Ectopic Multiple Living   Review of Systems  Gastrointestinal: Positive for nausea, vomiting and constipation.  Genitourinary: Positive for dysuria. Negative for flank pain, vaginal bleeding and menstrual problem.    A complete 10 system review of systems was obtained and all systems are negative except as noted in the HPI and PMH.   Allergies  Review of patient's allergies indicates no known allergies.  Home Medications   Prior to Admission medications   Not on File   BP 117/90 mmHg  Pulse 80  Temp(Src) 99 F (37.2 C) (Oral)  Resp 18  SpO2 100%  LMP  (LMP Unknown)  Breastfeeding? Yes Physical Exam  Constitutional: She is oriented to person, place, and time. She appears well-developed and well-nourished. No distress.  HENT:  Head: Normocephalic and atraumatic.  Mouth/Throat: Oropharynx is clear and moist. No oropharyngeal exudate.  Eyes: EOM are normal. Pupils are equal, round, and reactive to light.  Neck: Normal range of motion. Neck supple.  Cardiovascular: Normal rate, regular rhythm and normal heart sounds.  Exam reveals no gallop and  no friction rub.   No murmur heard. Pulmonary/Chest: Effort normal and breath sounds normal. No respiratory distress. She has no wheezes. She has no rales.  Abdominal: Soft. Bowel sounds are increased. There is no tenderness. There is no rebound and no guarding.  Hyperactive bowel sounds  Musculoskeletal: Normal range of motion.  Neurological: She is alert and oriented to person, place, and time.  Skin: Skin is warm and dry.  Psychiatric: She has a normal mood and affect. Her behavior is normal.  Nursing note and vitals reviewed.   ED Course  Procedures (including critical care time) DIAGNOSTIC STUDIES: Oxygen Saturation is 98% on RA, nl by my interpretation.    COORDINATION OF  CARE: 1:10 AM Discussed treatment plan with pt at bedside which includes UA and pt agreed to plan.  2:57 AM- Bedside US performed by Dr. Nicanor AlconPalumbo with positive IUP, positive cardiac activity, no adnexal masses. Pt will be given discharge instruction for a UTI.    Labs Review Labs Reviewed  URINALYSIS, ROUTINE W REFLEX MICROSCOPIC (NOT [facility]) - Abnormal; Notable for the following:    Color, Urine AMBER (*)    APPearance CLOUDY (*)    Hgb urine dipstick MODERATE (*)    Leukocytes, UA MODERATE (*)    All other components within normal limits  URINE MICROSCOPIC-ADD ON - Abnormal; Notable for the following:    Squamous Epithelial / LPF 6-30 (*)    Bacteria, UA FEW (*)    All other components within normal limits  I-STAT BETA HCG BLOOD, ED ([facility], [facility], [facility] ONLY) - Abnormal; Notable for the following:    I-stat hCG, quantitative >2000.0 (*)    All other components within normal limits    Imaging Review No results found. I have personally reviewed and evaluated these lab results as part of my medical decision-making.   EKG Interpretation None      MDM   Final diagnoses:  None    IUP by me on US with cardiac activity.  UTI.  Will treat safe for discharge at this time  I personally performed the services described in this documentation, which was scribed in my presence. The recorded information has been reviewed and is accurate.      Cy BlamerApril Aymara Sassi, MD 10/16/15 [phone]

## 2015-10-16 NOTE — ED Notes (Signed)
Bed: WA17 Expected date:  Expected time:  Means of arrival:  Comments: Preg/bipolar/abd pain

## 2015-10-16 NOTE — ED Notes (Signed)
[facility] [place] 29 [facility] [place] [facility] a [facility] and bipolar disorder who has had abdominal pain for past two days and feels like she is having a miscarriage.  Pt is currently asking for food and Tylenol 3 to prevent the miscarriage.  In police custody at this time and reported that patient did not c/o pain until she was arrested per [employer] officers.

## 2015-10-16 NOTE — ED Notes (Signed)
Pt ambulating independently w/ steady gait on d/c in no acute distress, A&Ox4. D/c instructions reviewed w/ pt and officer at bedside any further questions or concerns at present.

## 2015-10-16 NOTE — ED Notes (Signed)
Pt states she doesn't have to urinate at this time

## 2015-10-28 ENCOUNTER — Inpatient Hospital Stay (HOSPITAL_COMMUNITY): Payer: Medicaid Other

## 2015-10-28 ENCOUNTER — Telehealth: Payer: Self-pay | Admitting: *Deleted

## 2015-10-28 ENCOUNTER — Encounter (HOSPITAL_COMMUNITY): Payer: Self-pay | Admitting: *Deleted

## 2015-10-28 ENCOUNTER — Inpatient Hospital Stay (HOSPITAL_COMMUNITY)
Admission: AD | Admit: 2015-10-28 | Discharge: 2015-10-28 | Disposition: A | Discharge status: Home / Self Care | Payer: Medicaid Other | Source: Ambulatory Visit | Attending: Family Medicine | Admitting: Family Medicine

## 2015-10-28 DIAGNOSIS — O209 Hemorrhage in early pregnancy, unspecified: Secondary | ICD-10-CM | POA: Insufficient documentation

## 2015-10-28 DIAGNOSIS — O23591 Infection of other part of genital tract in pregnancy, first trimester: Secondary | ICD-10-CM | POA: Insufficient documentation

## 2015-10-28 DIAGNOSIS — O26851 Spotting complicating pregnancy, first trimester: Secondary | ICD-10-CM

## 2015-10-28 DIAGNOSIS — O418X11 Other specified disorders of amniotic fluid and membranes, first trimester, fetus 1: Secondary | ICD-10-CM

## 2015-10-28 DIAGNOSIS — A5901 Trichomonal vulvovaginitis: Secondary | ICD-10-CM | POA: Diagnosis not present

## 2015-10-28 DIAGNOSIS — Z3A12 12 weeks gestation of pregnancy: Secondary | ICD-10-CM | POA: Diagnosis not present

## 2015-10-28 DIAGNOSIS — O468X1 Other antepartum hemorrhage, first trimester: Secondary | ICD-10-CM

## 2015-10-28 HISTORY — DX: Schizophrenia, unspecified: F20.9

## 2015-10-28 LAB — WET PREP, GENITAL
CLUE CELLS WET PREP: NONE SEEN
Sperm: NONE SEEN
Yeast Wet Prep HPF POC: NONE SEEN

## 2015-10-28 LAB — URINALYSIS, ROUTINE W REFLEX MICROSCOPIC
Bilirubin Urine: NEGATIVE
Glucose, UA: NEGATIVE mg/dL
Ketones, ur: NEGATIVE mg/dL
Nitrite: NEGATIVE
Protein, ur: 30 mg/dL — AB
Specific Gravity, Urine: 1.02 (ref 1.005–1.030)
pH: 7.5 (ref 5.0–8.0)

## 2015-10-28 LAB — CBC
HEMATOCRIT: 38.2 % (ref 36.0–46.0)
Hemoglobin: 13.7 g/dL (ref 12.0–15.0)
MCH: 30.2 pg (ref 26.0–34.0)
MCHC: 35.9 g/dL (ref 30.0–36.0)
MCV: 84.1 fL (ref 78.0–100.0)
PLATELETS: 179 10*3/uL (ref 150–400)
RBC: 4.54 MIL/uL (ref 3.87–5.11)
RDW: 12.7 % (ref 11.5–15.5)
WBC: 7.6 10*3/uL (ref 4.0–10.5)

## 2015-10-28 LAB — RAPID URINE DRUG SCREEN, HOSP PERFORMED
Amphetamines: NOT DETECTED
BARBITURATES: NOT DETECTED
Benzodiazepines: NOT DETECTED
Cocaine: NOT DETECTED
Opiates: NOT DETECTED
TETRAHYDROCANNABINOL: POSITIVE — AB

## 2015-10-28 LAB — URINE MICROSCOPIC-ADD ON

## 2015-10-28 MED ORDER — METRONIDAZOLE 500 MG PO TABS
2000.0000 mg | ORAL_TABLET | Freq: Once | ORAL | Status: AC
  Administered 2015-10-28: 2000 mg via ORAL
  Filled 2015-10-28: qty 4

## 2015-10-28 NOTE — MAU Provider Note (Signed)
Chief Complaint: Vaginal Bleeding  First Provider Initiated Contact with Patient 10/30/2015 at 1620.   SUBJECTIVE HPI: Makayla Medina is a 29 y.o. G5P1021 at [redacted]w[redacted]d who presents to Maternity Admissions reporting light vaginal bleeding for several days. Denies passage of tissue or clots. Ultrasound performed 10/16/2015 at [facility] ED showed a single live intrauterine pregnancy. Has not Started prenatal care. Patient is incarcerated.  Associated signs and symptoms: Negative for abdominal pain, passage of tissue or passage of clots. Positive for vaginal discharge.  Past Medical History  Diagnosis Date  . Anxiety   . Bipolar disorder (HCC)   . Vaginal Pap smear, abnormal   . Gonorrhea   . Chlamydia   . Schizophrenia (HCC)    OB History  Gravida Para Term Preterm AB SAB TAB Ectopic Multiple Living  # Outcome Date GA Lbr Len/2nd Weight Sex Delivery Anes PTL Lv  5 Current           4 Gravida           3 TAB           2 TAB           1 Term              Past Surgical History  Procedure Laterality Date  . Dilation and curettage of uterus    . Colposcopy     Social History   Social History  . Marital Status: Single    Spouse Name: N/A  . Number of Children: N/A  . Years of Education: N/A   Occupational History  . Not on file.   Social History Main Topics  . Smoking status: Current Some Day Smoker -- 0.25 packs/day    Types: Cigarettes  . Smokeless tobacco: Not on file  . Alcohol Use: 0.6 oz/week    1 Glasses of wine per week     Comment: socially  . Drug Use: Yes    Special: Marijuana, Cocaine     Comment: 3 days ago. Uses once a week.  Marland Kitchen Sexual Activity: Yes     Comment: pt will niot answer   Other Topics Concern  . Not on file   Social History Narrative   No current facility-administered medications on file prior to encounter.   Current Outpatient Prescriptions on File Prior to Encounter  Medication Sig Dispense Refill  .  nitrofurantoin, macrocrystal-monohydrate, (MACROBID) 100 MG capsule Take 1 capsule (100 mg total) by mouth 2 (two) times daily. X 7 days 14 capsule 0   No Known Allergies  I have reviewed the past Medical Hx, Surgical Hx, Social Hx, Allergies and Medications.   Review of Systems  Constitutional: Negative for fever and chills.  Gastrointestinal: Negative for abdominal pain.  Genitourinary: Positive for vaginal bleeding and vaginal discharge. Negative for dysuria, urgency, frequency, hematuria and flank pain.    OBJECTIVE Patient Vitals for the past 24 hrs:  BP Temp Temp src Pulse Resp SpO2  10/28/15 1521 107/58 mmHg 97.6 F (36.4 C) Oral 80 18 100 %   Constitutional: Well-developed, well-nourished female in no acute distress.  Cardiovascular: normal rate Respiratory: normal rate and effort.  GI: Abd soft, non-tender. Gravid, size equal states. MS: Extremities nontender, no edema, normal ROM Neurologic: Alert and oriented x 4.  GU: Neg CVAT.  SPECULUM EXAM: NEFG, moderate amount of frothy, yellow, malodorous discharge, scant blood noted initially, but cervix very friable, "strawberry cervix"  BIMANUAL: cervix long and closed; uterus 12-13 week size, no adnexal tenderness or masses. No CMT.  LAB RESULTS Results for orders placed or performed during the hospital encounter of 10/28/15 (from the past 24 hour(s))  Urinalysis, Routine w reflex microscopic (not at [facility])     Status: Abnormal   Collection Time: 10/28/15  3:15 PM  Result Value Ref Range   Color, Urine YELLOW YELLOW   APPearance HAZY (A) CLEAR   Specific Gravity, Urine 1.020 1.005 - 1.030   pH 7.5 5.0 - 8.0   Glucose, UA NEGATIVE NEGATIVE mg/dL   Hgb urine dipstick LARGE (A) NEGATIVE   Bilirubin Urine NEGATIVE NEGATIVE   Ketones, ur NEGATIVE NEGATIVE mg/dL   Protein, ur 30 (A) NEGATIVE mg/dL   Nitrite NEGATIVE NEGATIVE   Leukocytes, UA LARGE (A) NEGATIVE  Urine microscopic-add on     Status: Abnormal   Collection  Time: 10/28/15  3:15 PM  Result Value Ref Range   Squamous Epithelial / LPF 6-30 (A) NONE SEEN   WBC, UA 6-30 0 - 5 WBC/hpf   RBC / HPF 6-30 0 - 5 RBC/hpf   Bacteria, UA MANY (A) NONE SEEN   Urine-Other TRICHOMONAS PRESENT   Wet prep, genital     Status: Abnormal   Collection Time: 10/28/15  4:26 PM  Result Value Ref Range   Yeast Wet Prep HPF POC NONE SEEN NONE SEEN   Trich, Wet Prep PRESENT (A) NONE SEEN   Clue Cells Wet Prep HPF POC NONE SEEN NONE SEEN   WBC, Wet Prep HPF POC MANY (A) NONE SEEN   Sperm NONE SEEN   CBC     Status: None   Collection Time: 10/28/15  4:44 PM  Result Value Ref Range   WBC 7.6 4.0 - 10.5 K/uL   RBC 4.54 3.87 - 5.11 MIL/uL   Hemoglobin 13.7 12.0 - 15.0 g/dL   HCT 16.1 09.6 - 04.5 %   MCV 84.1 78.0 - 100.0 fL   MCH 30.2 26.0 - 34.0 pg   MCHC 35.9 30.0 - 36.0 g/dL   RDW 40.9 81.1 - 91.4 %   Platelets 179 150 - 400 K/uL    IMAGING US Ob Comp Less 14 Wks  10/28/2015  CLINICAL DATA:  Twelve weeks pregnant, bleeding and cramping. EXAM: OBSTETRIC <14 WK ULTRASOUND TECHNIQUE: Transabdominal ultrasound was performed for evaluation of the gestation as well as the maternal uterus and adnexal regions. COMPARISON:  09/18/2015 FINDINGS: Intrauterine gestational sac: Visualized/normal in shape. Yolk sac:  Not visualized Embryo:  Visualized Cardiac Activity: Visualized Heart Rate: 150 bpm CRL:   6.04 cm  mm   12 w 4 d                  [place] EDC: 05/07/2016 Maternal uterus/adnexae: Single viable intrauterine pregnancy with an estimated gestational age [redacted] weeks 4 days by crown-rump length. Cardiac activity detected with a heart rate of 150 beats per minute. Trace subchorionic hemorrhage suspected. Ovaries appear normal. No free fluid. IMPRESSION: Single viable 12 week 4 day IUP. Electronically Signed   By: Judie Petit.  Shick M.D.   On: 10/28/2015 17:46    MAU COURSE CBC, ultrasound, wet prep and GC/chlamydia culture, UA, HIV.  Flagyl given for Trichomonas.  MDM Vaginal  bleeding in early pregnancy with normal intrauterine pregnancy and hemodynamically stable. Bleeding from friable cervix with Trichomonas and subchorionic hemorrhage. No apparent emergent condition.  ASSESSMENT 1. Subchorionic hematoma, antepartum, first trimester, fetus 1   2. Vaginal bleeding  in pregnancy, first trimester   3. Trichomonal vaginitis during pregnancy in first trimester    PLAN Discharge back to jail in stable condition. Bleeding precautions Pelvic rest 1 week. Partner needs treatment. No intercourse for at least 7 days after both patient and partner treated. Patient will not have access to partner to give him expedited partner therapy. HIV, GC/Chlamydia cultures pending Follow-up Information    [facility] On 11/13/2015.   Specialty:  Obstetrics and Gynecology   Why:  at 8:45 am for New OB appoitment   Contact information:   [address] [place] [id number] [phone]      [facility].   Why:  As needed in emergencies   Contact information:   [address] [id number] [place] [id number] [phone]       Medication List    TAKE these medications        nitrofurantoin (macrocrystal-monohydrate) 100 MG capsule  Commonly known as:  MACROBID  Take 1 capsule (100 mg total) by mouth 2 (two) times daily. X 7 days     prenatal multivitamin Tabs tablet  Take 1 tablet by mouth daily at 12 noon.       [place] Shaylynne Lunt, CNM 10/28/2015  5:42 PM  4

## 2015-10-28 NOTE — MAU Note (Signed)
Pt is incarcerated and escorted by [employer].  Pt states she continues to have vaginal bleeding and states something isn't right with the pregnancy.  Pt states she changed two pads yesterday and two today so far.

## 2015-10-28 NOTE — Telephone Encounter (Signed)
[facility] stating that [place] had 1 episode of vaginal bleeding which was bright red and filled a sanitary pad. She is also experiencing abdominal cramping. Lawanna Kobusngel requested advice as to whether inmate should be brought to the hospital or to our clinic for evaluation. I consulted with Judeth HornErin Lawrence NP in the clinic and pt's chart was reviewed. Pt has documented IUP per ED note on 10/16/15.  I then told Lawanna Kobusngel RN that inmate should be taken to MAU for evaluation if she saturates another pad or if pain becomes more severe. She may have tylenol for discomfort. Lawanna KobusAngel RN voiced understanding.

## 2015-10-28 NOTE — Discharge Instructions (Signed)
Subchorionic Hematoma A subchorionic hematoma is a gathering of blood between the outer wall of the placenta and the inner wall of the womb (uterus). The placenta is the organ that connects the fetus to the wall of the uterus. The placenta performs the feeding, breathing (oxygen to the fetus), and waste removal (excretory work) of the fetus.  Subchorionic hematoma is the most common abnormality found on a result from ultrasonography done during the first trimester or early second trimester of pregnancy. If there has been little or no vaginal bleeding, early small hematomas usually shrink on their own and do not affect your baby or pregnancy. The blood is gradually absorbed over 1-2 weeks. When bleeding starts later in pregnancy or the hematoma is larger or occurs in an older pregnant woman, the outcome may not be as good. Larger hematomas may get bigger, which increases the chances for miscarriage. Subchorionic hematoma also increases the risk of premature detachment of the placenta from the uterus, preterm (premature) labor, and stillbirth. HOME CARE INSTRUCTIONS  Stay on bed rest if your health care provider recommends this. Although bed rest will not prevent more bleeding or prevent a miscarriage, your health care provider may recommend bed rest until you are advised otherwise.  Avoid heavy lifting (more than 10 lb [4.5 kg]), exercise, sexual intercourse, or douching as directed by your health care provider.  Keep track of the number of pads you use each day and how soaked (saturated) they are. Write down this information.  Do not use tampons.  Keep all follow-up appointments as directed by your health care provider. Your health care provider may ask you to have follow-up blood tests or ultrasound tests or both. SEEK IMMEDIATE MEDICAL CARE IF:  You have severe cramps in your stomach, back, abdomen, or pelvis.  You have a fever.  You pass large clots or tissue. Save any tissue for your health  care provider to look at.  Your bleeding increases or you become lightheaded, feel weak, or have fainting episodes.   This information is not intended to replace advice given to you by your health care provider. Make sure you discuss any questions you have with your health care provider.   Document Released: 02/23/2007 Document Revised: 11/29/2014 Document Reviewed: 06/07/2013 Elsevier Interactive Patient Education 2016 [employer]  Prenatal Care WHAT IS PRENATAL CARE?  Prenatal care is the process of caring for a pregnant woman before she gives birth. Prenatal care makes sure that she and her baby remain as healthy as possible throughout pregnancy. Prenatal care may be provided by a midwife, family practice health care provider, or a childbirth and pregnancy specialist (obstetrician). Prenatal care may include physical examinations, testing, treatments, and education on nutrition, lifestyle, and social support services. WHY IS PRENATAL CARE SO IMPORTANT?  Early and consistent prenatal care increases the chance that you and your baby will remain healthy throughout your pregnancy. This type of care also decreases a baby's risk of being born too early (prematurely), or being born smaller than expected (small for gestational age). Any underlying medical conditions you may have that could pose a risk during your pregnancy are discussed during prenatal care visits. You will also be monitored regularly for any new conditions that may arise during your pregnancy so they can be treated quickly and effectively. WHAT HAPPENS DURING PRENATAL CARE VISITS? Prenatal care visits may include the following: Discussion Tell your health care provider about any new signs or symptoms you have experienced since your last visit. These might  include:  Nausea or vomiting.  Increased or decreased level of energy.  Difficulty sleeping.  Back or leg pain.  Weight changes.  Frequent urination.  Shortness of  breath with physical activity.  Changes in your skin, such as the development of a rash or itchiness.  Vaginal discharge or bleeding.  Feelings of excitement or nervousness.  Changes in your baby's movements. You may want to write down any questions or topics you want to discuss with your health care provider and bring them with you to your appointment. Examination During your first prenatal care visit, you will likely have a complete physical exam. Your health care provider will often examine your vagina, cervix, and the position of your uterus, as well as check your heart, lungs, and other body systems. As your pregnancy progresses, your health care provider will measure the size of your uterus and your baby's position inside your uterus. He or she may also examine you for early signs of labor. Your prenatal visits may also include checking your blood pressure and, after about 10-12 weeks of pregnancy, listening to your baby's heartbeat. Testing Regular testing often includes:  Urinalysis. This checks your urine for glucose, protein, or signs of infection.  Blood count. This checks the levels of white and red blood cells in your body.  Tests for sexually transmitted infections (STIs). Testing for STIs at the beginning of pregnancy is routinely done and is required in many states.  Antibody testing. You will be checked to see if you are immune to certain illnesses, such as rubella, that can affect a developing fetus.  Glucose screen. Around 24-28 weeks of pregnancy, your blood glucose level will be checked for signs of gestational diabetes. Follow-up tests may be recommended.  Group B strep. This is a bacteria that is commonly found inside a woman's vagina. This test will inform your health care provider if you need an antibiotic to reduce the amount of this bacteria in your body prior to labor and childbirth.  Ultrasound. Many pregnant women undergo an ultrasound screening around 18-20  weeks of pregnancy to evaluate the health of the fetus and check for any developmental abnormalities.  HIV (human immunodeficiency virus) testing. Early in your pregnancy, you will be screened for HIV. If you are at high risk for HIV, this test may be repeated during your third trimester of pregnancy. You may be offered other testing based on your age, personal or family medical history, or other factors.  HOW OFTEN SHOULD I PLAN TO SEE MY HEALTH CARE PROVIDER FOR PRENATAL CARE? Your prenatal care check-up schedule depends on any medical conditions you have before, or develop during, your pregnancy. If you do not have any underlying medical conditions, you will likely be seen for checkups:  Monthly, during the first 6 months of pregnancy.  Twice a month during months 7 and 8 of pregnancy.  Weekly starting in the 9th month of pregnancy and until delivery. If you develop signs of early labor or other concerning signs or symptoms, you may need to see your health care provider more often. Ask your health care provider what prenatal care schedule is best for you. WHAT CAN I DO TO KEEP MYSELF AND MY BABY AS HEALTHY AS POSSIBLE DURING MY PREGNANCY?  Take a prenatal vitamin containing 400 micrograms (0.4 mg) of folic acid every day. Your health care provider may also ask you to take additional vitamins such as iodine, vitamin D, iron, copper, and zinc.  Take 1500-2000 mg of calcium  daily starting at your 20th week of pregnancy until you deliver your baby.  Make sure you are up to date on your vaccinations. Unless directed otherwise by your health care provider:  You should receive a tetanus, diphtheria, and pertussis (Tdap) vaccination between the 27th and 36th week of your pregnancy, regardless of when your last Tdap immunization occurred. This helps protect your baby from whooping cough (pertussis) after he or she is born.  You should receive an annual inactivated influenza vaccine (IIV) to help  protect you and your baby from influenza. This can be done at any point during your pregnancy.  Eat a well-rounded diet that includes:  Fresh fruits and vegetables.  Lean proteins.  Calcium-rich foods such as milk, yogurt, hard cheeses, and dark, leafy greens.  Whole grain breads.  Do noteat seafood high in mercury, including:  Swordfish.  Tilefish.  Shark.  King mackerel.  More than 6 oz tuna per week.  Do not eat:  Raw or undercooked meats or eggs.  Unpasteurized foods, such as soft cheeses (brie, blue, or feta), juices, and milks.  Lunch meats.  Hot dogs that have not been heated until they are steaming.  Drink enough water to keep your urine clear or pale yellow. For many women, this may be 10 or more 8 oz glasses of water each day. Keeping yourself hydrated helps deliver nutrients to your baby and may prevent the start of pre-term uterine contractions.  Do not use any tobacco products including cigarettes, chewing tobacco, or electronic cigarettes. If you need help quitting, ask your health care provider.  Do not drink beverages containing alcohol. No safe level of alcohol consumption during pregnancy has been determined.  Do not use any illegal drugs. These can harm your developing baby or cause a miscarriage.  Ask your health care provider or pharmacist before taking any prescription or over-the-counter medicines, herbs, or supplements.  Limit your caffeine intake to no more than 200 mg per day.  Exercise. Unless told otherwise by your health care provider, try to get 30 minutes of moderate exercise most days of the week. Do not  do high-impact activities, contact sports, or activities with a high risk of falling, such as horseback riding or downhill skiing.  Get plenty of rest.  Avoid anything that raises your body temperature, such as hot tubs and saunas.  If you own a cat, do not empty its litter box. Bacteria contained in cat feces can cause an  infection called toxoplasmosis. This can result in serious harm to the fetus.  Stay away from chemicals such as insecticides, lead, mercury, and cleaning or paint products that contain solvents.  Do not have any X-rays taken unless medically necessary.  Take a childbirth and breastfeeding preparation class. Ask your health care provider if you need a referral or recommendation.   This information is not intended to replace advice given to you by your health care provider. Make sure you discuss any questions you have with your health care provider.   Document Released: 11/11/2003 Document Revised: 11/29/2014 Document Reviewed: 01/23/2014 Elsevier Interactive Patient Education [employer]

## 2015-10-29 ENCOUNTER — Encounter: Payer: Self-pay | Admitting: Advanced Practice Midwife

## 2015-10-29 LAB — GC/CHLAMYDIA PROBE AMP (~~LOC~~) NOT AT ARMC
Chlamydia: NEGATIVE
NEISSERIA GONORRHEA: NEGATIVE

## 2015-10-29 LAB — HIV ANTIBODY (ROUTINE TESTING W REFLEX): HIV SCREEN 4TH GENERATION: NONREACTIVE

## 2015-11-04 ENCOUNTER — Encounter: Payer: Self-pay | Admitting: *Deleted

## 2015-11-11 ENCOUNTER — Emergency Department (HOSPITAL_COMMUNITY)
Admission: EM | Admit: 2015-11-11 | Discharge: 2015-11-12 | Disposition: A | Discharge status: Home / Self Care | Payer: Medicaid Other | Attending: Emergency Medicine | Admitting: Emergency Medicine

## 2015-11-11 ENCOUNTER — Encounter (HOSPITAL_COMMUNITY): Payer: Self-pay | Admitting: *Deleted

## 2015-11-11 ENCOUNTER — Emergency Department (HOSPITAL_COMMUNITY): Payer: Medicaid Other

## 2015-11-11 DIAGNOSIS — R4020 Unspecified coma: Secondary | ICD-10-CM

## 2015-11-11 DIAGNOSIS — Z8619 Personal history of other infectious and parasitic diseases: Secondary | ICD-10-CM | POA: Diagnosis not present

## 2015-11-11 DIAGNOSIS — R4689 Other symptoms and signs involving appearance and behavior: Secondary | ICD-10-CM

## 2015-11-11 DIAGNOSIS — O9989 Other specified diseases and conditions complicating pregnancy, childbirth and the puerperium: Secondary | ICD-10-CM | POA: Insufficient documentation

## 2015-11-11 DIAGNOSIS — F1210 Cannabis abuse, uncomplicated: Secondary | ICD-10-CM | POA: Diagnosis not present

## 2015-11-11 DIAGNOSIS — R4182 Altered mental status, unspecified: Secondary | ICD-10-CM

## 2015-11-11 DIAGNOSIS — F1220 Cannabis dependence, uncomplicated: Secondary | ICD-10-CM | POA: Diagnosis present

## 2015-11-11 DIAGNOSIS — F22 Delusional disorders: Secondary | ICD-10-CM | POA: Diagnosis not present

## 2015-11-11 DIAGNOSIS — F250 Schizoaffective disorder, bipolar type: Secondary | ICD-10-CM | POA: Diagnosis present

## 2015-11-11 DIAGNOSIS — F17210 Nicotine dependence, cigarettes, uncomplicated: Secondary | ICD-10-CM | POA: Diagnosis not present

## 2015-11-11 DIAGNOSIS — F1410 Cocaine abuse, uncomplicated: Secondary | ICD-10-CM | POA: Diagnosis not present

## 2015-11-11 DIAGNOSIS — F911 Conduct disorder, childhood-onset type: Secondary | ICD-10-CM | POA: Insufficient documentation

## 2015-11-11 DIAGNOSIS — O99351 Diseases of the nervous system complicating pregnancy, first trimester: Secondary | ICD-10-CM | POA: Diagnosis not present

## 2015-11-11 DIAGNOSIS — O99320 Drug use complicating pregnancy, unspecified trimester: Secondary | ICD-10-CM

## 2015-11-11 DIAGNOSIS — F1910 Other psychoactive substance abuse, uncomplicated: Secondary | ICD-10-CM

## 2015-11-11 DIAGNOSIS — F6089 Other specific personality disorders: Secondary | ICD-10-CM | POA: Diagnosis not present

## 2015-11-11 DIAGNOSIS — Z3A15 15 weeks gestation of pregnancy: Secondary | ICD-10-CM | POA: Insufficient documentation

## 2015-11-11 DIAGNOSIS — O99331 Smoking (tobacco) complicating pregnancy, first trimester: Secondary | ICD-10-CM | POA: Insufficient documentation

## 2015-11-11 LAB — CBC WITH DIFFERENTIAL/PLATELET
BASOS ABS: 0 10*3/uL (ref 0.0–0.1)
BASOS PCT: 0 %
EOS ABS: 0 10*3/uL (ref 0.0–0.7)
EOS PCT: 0 %
HCT: 31.8 % — ABNORMAL LOW (ref 36.0–46.0)
Hemoglobin: 11.3 g/dL — ABNORMAL LOW (ref 12.0–15.0)
Lymphocytes Relative: 9 %
Lymphs Abs: 1.1 10*3/uL (ref 0.7–4.0)
MCH: 30.6 pg (ref 26.0–34.0)
MCHC: 35.5 g/dL (ref 30.0–36.0)
MCV: 86.2 fL (ref 78.0–100.0)
MONO ABS: 0.8 10*3/uL (ref 0.1–1.0)
MONOS PCT: 7 %
NEUTROS ABS: 10.2 10*3/uL — AB (ref 1.7–7.7)
Neutrophils Relative %: 84 %
PLATELETS: 177 10*3/uL (ref 150–400)
RBC: 3.69 MIL/uL — ABNORMAL LOW (ref 3.87–5.11)
RDW: 13.1 % (ref 11.5–15.5)
WBC: 12.2 10*3/uL — ABNORMAL HIGH (ref 4.0–10.5)

## 2015-11-11 LAB — URINALYSIS, ROUTINE W REFLEX MICROSCOPIC
BILIRUBIN URINE: NEGATIVE
Glucose, UA: NEGATIVE mg/dL
Hgb urine dipstick: NEGATIVE
NITRITE: NEGATIVE
PROTEIN: NEGATIVE mg/dL
Specific Gravity, Urine: 1.028 (ref 1.005–1.030)
pH: 6.5 (ref 5.0–8.0)

## 2015-11-11 LAB — RAPID URINE DRUG SCREEN, HOSP PERFORMED
Amphetamines: NOT DETECTED
BARBITURATES: NOT DETECTED
Benzodiazepines: NOT DETECTED
Cocaine: POSITIVE — AB
Opiates: NOT DETECTED
TETRAHYDROCANNABINOL: POSITIVE — AB

## 2015-11-11 LAB — COMPREHENSIVE METABOLIC PANEL
ALK PHOS: 50 U/L (ref 38–126)
ALT: 30 U/L (ref 14–54)
AST: 32 U/L (ref 15–41)
Albumin: 3.8 g/dL (ref 3.5–5.0)
Anion gap: 9 (ref 5–15)
BILIRUBIN TOTAL: 0.5 mg/dL (ref 0.3–1.2)
BUN: 9 mg/dL (ref 6–20)
CALCIUM: 9.4 mg/dL (ref 8.9–10.3)
CO2: 23 mmol/L (ref 22–32)
Chloride: 106 mmol/L (ref 101–111)
Creatinine, Ser: 0.55 mg/dL (ref 0.44–1.00)
GFR calc Af Amer: >60 mL/min (ref >60)
Glucose, Bld: 114 mg/dL — ABNORMAL HIGH (ref 65–99)
POTASSIUM: 3.4 mmol/L — AB (ref 3.5–5.1)
Sodium: 138 mmol/L (ref 135–145)
TOTAL PROTEIN: 6.8 g/dL (ref 6.5–8.1)

## 2015-11-11 LAB — HCG, QUANTITATIVE, PREGNANCY: HCG, BETA CHAIN, QUANT, S: 45122 m[IU]/mL — AB (ref <5)

## 2015-11-11 LAB — URINE MICROSCOPIC-ADD ON

## 2015-11-11 LAB — TROPONIN I

## 2015-11-11 LAB — SALICYLATE LEVEL

## 2015-11-11 LAB — ACETAMINOPHEN LEVEL

## 2015-11-11 LAB — I-STAT CG4 LACTIC ACID, ED: Lactic Acid, Venous: 1.49 mmol/L (ref 0.5–2.0)

## 2015-11-11 LAB — ETHANOL

## 2015-11-11 MED ORDER — SODIUM CHLORIDE 0.9 % IV BOLUS (SEPSIS)
1000.0000 mL | Freq: Once | INTRAVENOUS | Status: AC
  Administered 2015-11-11: 1000 mL via INTRAVENOUS

## 2015-11-11 MED ORDER — HALOPERIDOL LACTATE 5 MG/ML IJ SOLN
5.0000 mg | Freq: Once | INTRAMUSCULAR | Status: AC
  Administered 2015-11-11: 5 mg via INTRAVENOUS
  Filled 2015-11-11: qty 1

## 2015-11-11 NOTE — ED Notes (Signed)
Nurse drawing labs. 

## 2015-11-11 NOTE — ED Notes (Signed)
Pt attempted urine sample but stated she missed the cup

## 2015-11-11 NOTE — Progress Notes (Signed)
Provided patient with list of homeless shelters and food pantries in [place].

## 2015-11-11 NOTE — ED Provider Notes (Signed)
CSN: [id number]     Arrival date & time 11/11/15  1557 History   First MD Initiated Contact with Patient 11/11/15 1612     Chief Complaint  Patient presents with  . Altered Mental Status     (Consider location/radiation/quality/duration/timing/severity/associated sxs/prior Treatment) HPI Comments: 29 year old female with a history of bipolar disorder, recently diagnosed first trimester pregnancy complicated by first trimester bleeding who presents with altered mental status. History limited because of the patient's confusion and obtained from EMS. EMS reports that they found the patient lying on the ground near a gas station, awake but disoriented. Gas station staff reported that the patient was "doing drugs." The patient herself states that she was walking outside because she is homeless but she does not recall any events earlier today. She is unsure whether she took any medications or drugs. She has had abdominal pain since she was evaluated for threatened abortion on 12/6. She has had no further vaginal bleeding since then. She was given flagyl for check of Monistat which she states that she took. Currently, she states that her "heart hurts" because it is beating so fast. She denies any fevers, vomiting, or diarrhea. She endorses dry cough recently.   Patient is a 29 y.o. female presenting with altered mental status. The history is provided by the EMS personnel.  Altered Mental Status   Past Medical History  Diagnosis Date  . Anxiety   . Bipolar disorder (HCC)   . Vaginal Pap smear, abnormal   . Gonorrhea   . Chlamydia   . Schizophrenia [facility])    Past Surgical History  Procedure Laterality Date  . Dilation and curettage of uterus    . Colposcopy     Family History  Problem Relation Age of Onset  . Alcoholism Mother    Social History  Substance Use Topics  . Smoking status: Current Some Day Smoker -- 0.25 packs/day    Types: Cigarettes  . Smokeless tobacco: None  . Alcohol  Use: 0.6 oz/week    1 Glasses of wine per week     Comment: socially   OB History    Gravida Para Term Preterm AB TAB SAB Ectopic Multiple Living   Review of Systems  Unable to perform ROS: Mental status change     Allergies  Review of patient's allergies indicates no known allergies.  Home Medications   Prior to Admission medications   Medication Sig Start Date End Date Taking? Authorizing Provider  acetaminophen (TYLENOL) 325 MG tablet Take 650 mg by mouth every 6 (six) hours as needed for moderate pain.   Yes Historical Provider, MD  nitrofurantoin, macrocrystal-monohydrate, (MACROBID) 100 MG capsule Take 1 capsule (100 mg total) by mouth 2 (two) times daily. X 7 days Patient not taking: Reported on 11/11/2015 10/16/15   April Palumbo, MD   BP 86/59 mmHg  Pulse 74  Temp(Src) 98 F (36.7 C) (Oral)  Resp 16  SpO2 98%  LMP  (LMP Unknown) Physical Exam  Constitutional: She appears well-developed and well-nourished. No distress.  Shivering  HENT:  Head: Normocephalic and atraumatic.  Moist mucous membranes  Eyes: Conjunctivae and EOM are normal. Pupils are equal, round, and reactive to light.  Neck: Neck supple.  Cardiovascular: Regular rhythm and normal heart sounds.   No murmur heard. Tachycardic  Pulmonary/Chest: Effort normal and breath sounds normal.  Abdominal: Soft. Bowel sounds are normal. She exhibits no distension. There is  no tenderness.  Musculoskeletal: She exhibits no edema.  Neurological: She is alert.  Oriented to person and place, able to answer basic questions but unable to give details, moving all 4 extremities  Skin: Skin is warm and dry.  Psychiatric:  Mildly anxious during conversation  Nursing note and vitals reviewed.   ED Course  Procedures (including critical care time) Labs Review Labs Reviewed  COMPREHENSIVE METABOLIC PANEL - Abnormal; Notable for the following:    Potassium 3.4 (*)    Glucose, Bld 114 (*)     All other components within normal limits  CBC WITH DIFFERENTIAL/PLATELET - Abnormal; Notable for the following:    WBC 12.2 (*)    RBC 3.69 (*)    Hemoglobin 11.3 (*)    HCT 31.8 (*)    Neutro Abs 10.2 (*)    All other components within normal limits  ACETAMINOPHEN LEVEL - Abnormal; Notable for the following:    Acetaminophen (Tylenol), Serum <10 (*)    All other components within normal limits  URINE RAPID DRUG SCREEN, HOSP PERFORMED - Abnormal; Notable for the following:    Cocaine POSITIVE (*)    Tetrahydrocannabinol POSITIVE (*)    All other components within normal limits  HCG, QUANTITATIVE, PREGNANCY - Abnormal; Notable for the following:    hCG, Beta Chain, Quant, S 47829 (*)    All other components within normal limits  URINALYSIS, ROUTINE W REFLEX MICROSCOPIC (NOT AT [facility]) - Abnormal; Notable for the following:    Color, Urine AMBER (*)    Ketones, ur >80 (*)    Leukocytes, UA SMALL (*)    All other components within normal limits  URINE MICROSCOPIC-ADD ON - Abnormal; Notable for the following:    Squamous Epithelial / LPF 0-5 (*)    Bacteria, UA MANY (*)    All other components within normal limits  TROPONIN I  SALICYLATE LEVEL  ETHANOL  I-STAT CG4 LACTIC ACID, ED    Imaging Review Dg Chest 2 View  11/11/2015  CLINICAL DATA:  Altered mental status EXAM: CHEST  2 VIEW COMPARISON:  None. FINDINGS: Lungs are clear. Heart size and pulmonary vascularity are normal. No adenopathy. No bone lesions. IMPRESSION: No edema or consolidation. Electronically Signed   By: Bretta Bang III M.D.   On: 11/11/2015 17:27   I have personally reviewed and evaluated these lab results as part of my medical decision-making.   EKG Interpretation None     Medications  sodium chloride 0.9 % bolus 1,000 mL (0 mLs Intravenous Stopped 11/11/15 2055)  haloperidol lactate (HALDOL) injection 5 mg (5 mg Intravenous Given 11/11/15 2058)  sodium chloride 0.9 % bolus 1,000 mL (1,000 mLs  Intravenous New Bag/Given 11/11/15 2246)  sodium chloride 0.9 % bolus 1,000 mL (0 mLs Intravenous Stopped 11/11/15 2354)    MDM   Final diagnoses:  Altered mental status, unspecified altered mental status type  Delusions (HCC)  Polysubstance abuse  Aggressive behavior   patient presents EMS for altered mental status when she was found outside confused. Patient tachycardic but alert on arrival with otherwise stable vital signs. She is oriented to person and place but unable to answer questions about details of earlier today. Unclear whether patient has had drug ingestion. Placed patient on cardiac monitoring, obtained above labs including toxicology workup, and obtained chest x-ray. Gave the patient an IV fluid bolus.  Labwork shows normal CMP, beta-hCG 45,000 consistent with her known pregnancy, stable anemia, UA containing some bacteria as well as squamous epithelial  cells, similar in appearance to past UAs. UDS positive for THC and cocaine. On reexamination several hours after arrival, the patient's tachycardia had improved. Nursing reported that she had become verbally abusive and I went to evaluate the patient. She was hostile towards me and stated that she wanted to see my badge number as there was a monitor in the room telling her all of the badge numbers of everybody who had been in her room today. Because of her escalating verbal abuse towards staff and my concerns for delusional thoughts, I filled out an IVC form as I am concerned about psychosis in setting of polysubstance abuse, homelessness, and pregnancy. I reviewed pt's chart and she has hx of psychosis as well as schizoaffective disorder. Pt given 5mg  haldol after which she fell asleep again. She became hypotensive after haldol but the remainder of VS stable. Gave 2nd IVF bolus. Her BP has been low throughout her ED stay but she has no evidence of infection, no tachycardia or fevers, and no change in mentation. I suspect BP related to  medication administration, the fact that patient is sleeping, and that she is currently pregnant. I see no other evidence of life-threatening condition in labs or on exam. I have consulted TTS for eval. they attempted to speak with patient after she had received Haldol but she was asleep and unable to answer questions. We will reconsult TTS in the morning and patient's disposition will be determined based on psychiatry recommendations.  Laurence Spatesachel Morgan Genevra Orne, MD 11/12/15 [phone]

## 2015-11-11 NOTE — ED Notes (Signed)
Consults made to case management and social worker

## 2015-11-11 NOTE — ED Notes (Signed)
Pt stated that she is too cold to give a urine sample at this time.

## 2015-11-11 NOTE — ED Notes (Signed)
Bed: [id number] Expected date:  Expected time:  Means of arrival:  Comments: EMS- 29yo F, tachy/AMS/ingestion?

## 2015-11-11 NOTE — ED Notes (Signed)
Pt stated still unable to give urine sample 

## 2015-11-11 NOTE — ED Notes (Signed)
Pt began to yell and scream out, upon this RN arrival to room pt started to cuss and stated "I asked for something to fucking eat 3 fucking hours ago." This RN explained that it was inappropriate to yell and cuss in this manner and that this RN had rounded on the patient approx 30mins ago and pt had declined any needs/wants at that time. Pt began to exacalate stating "you don't know who you are fucking with" - security called to room on stand-by. Juliette Mangleerri Doster, Charge RN, Clista BernhardtJoe Perez, [facility] and Dr. Clarene DukeLittle made aware of current situation and patient behavior. Will continue to monitor, Juliette Mangleerri Doster at bedside for assistance.

## 2015-11-11 NOTE — ED Notes (Signed)
Nurse will get labs from IV

## 2015-11-11 NOTE — ED Notes (Signed)
Per Dr. Clarene DukeLittle - pt currently in a state of acute psychosis, pointing at the wall/ceiling stating "this camera has caught everything that is going on in this room, you arent a doctor! I want your badge number" - no camera is present in pt's room. Pt moved to [facility] for closer observation and monitoring.

## 2015-11-11 NOTE — ED Notes (Signed)
Per EMS:pt was found laying ion the ground near the gas station, alert, disoriented, gas station staff told EMS she "was doing drugs", upon arrival to ED pt is A+O, states she was praying and "praising hallelujah".

## 2015-11-11 NOTE — ED Notes (Signed)
Patient given dinner tray, tearful and states she is sorry for cursing staff.  NS 1000 cc's infused, patient is eating and drinking.  Patient given phone and called Father, states she will have her Father come and pick her up.  Patient states she has been homeless because, "I have been a very disobedient child".

## 2015-11-12 ENCOUNTER — Emergency Department (HOSPITAL_COMMUNITY): Payer: Medicaid Other

## 2015-11-12 DIAGNOSIS — R4182 Altered mental status, unspecified: Secondary | ICD-10-CM | POA: Insufficient documentation

## 2015-11-12 DIAGNOSIS — F1220 Cannabis dependence, uncomplicated: Secondary | ICD-10-CM

## 2015-11-12 DIAGNOSIS — R4689 Other symptoms and signs involving appearance and behavior: Secondary | ICD-10-CM | POA: Insufficient documentation

## 2015-11-12 DIAGNOSIS — F6089 Other specific personality disorders: Secondary | ICD-10-CM | POA: Diagnosis not present
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
## 2015-11-12 NOTE — [facility] Suicide Risk Assessment (Cosign Needed)
Suicide Risk Assessment  Discharge Assessment   [facility]   Demographic Factors:  Adolescent or young adult, Low socioeconomic status and Unemployed  Total Time spent with patient: 20 minutes  Musculoskeletal: Strength & Muscle Tone: within normal limits Gait & Station: normal Patient leans: N/A  Psychiatric Specialty Exam:     Blood pressure 90/50, pulse 83, temperature 98.2 F (36.8 C), temperature source Oral, resp. rate 20, SpO2 95 %, currently breastfeeding.There is no weight on file to calculate BMI.  General Appearance: Casual  Eye Contact:: Good  Speech: Clear and Coherent and Normal Rate  Volume: Normal  Mood: Angry, Anxious and Irritable  Affect: Congruent  Thought Process: Coherent, Goal Directed and Intact  Orientation: Full (Time, Place, and Person)  Thought Content: WDL  Suicidal Thoughts: No  Homicidal Thoughts: No  Memory: Immediate; Good Recent; Good Remote; Good  Judgement: Fair  Insight: Fair  Psychomotor Activity: Normal  Concentration: Good  Recall: NA  Fund of Knowledge:Fair  Language: Good  Akathisia: NA  Handed: Right  AIMS (if indicated):    Assets: Desire for Improvement  ADL's: Intact  Cognition: WNL            Has this patient used any form of tobacco in the last 30 days? (Cigarettes, Smokeless Tobacco, Cigars, and/or Pipes) Yes, A prescription for an FDA-approved tobacco cessation medication was offered at discharge and the patient refused  Mental Status Per Nursing Assessment::   On Admission:     Current Mental Status by Physician: NA  Loss Factors: NA  Historical Factors: NA  Risk Reduction Factors:   Pregnancy, Responsible for children under 28 years of age and Living with another person, especially a relative  Continued Clinical Symptoms:  Bipolar Disorder:   Mixed State Alcohol/Substance Abuse/Dependencies  Cognitive Features That  Contribute To Risk:  Polarized thinking    Suicide Risk:  Minimal: No identifiable suicidal ideation.  Patients presenting with no risk factors but with morbid ruminations; may be classified as minimal risk based on the severity of the depressive symptoms  Principal Problem: Cannabis use disorder, moderate, dependence (HCC) Discharge Diagnoses:  Patient Active Problem List   Diagnosis Date Noted  . Schizoaffective disorder, bipolar type (HCC) [F25.0] 07/23/2015    Priority: High  . Cannabis use disorder, moderate, dependence (HCC) [F12.20] 07/23/2015    Priority: High  . Aggressive behavior [F60.89]   . Altered mental status [R41.82]   . Supervision of high-risk pregnancy [O09.90] 10/08/2015  . 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 Tobacco use disorder [F17.200] 07/23/2015  . Cocaine use disorder, severe, dependence (HCC) [F14.20] 07/22/2015      Plan Of Care/Follow-up recommendations:  Activity:  as tolerated Diet:  regular  Is patient on multiple antipsychotic therapies at discharge:  No   Has Patient had three or more failed trials of antipsychotic monotherapy by history:  No  Recommended Plan for Multiple Antipsychotic Therapies: NA    Earney NavyJosephine C Davison Ohms   PMHNP-BC 11/12/2015, 11:24 AM
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
## 2015-11-12 NOTE — ED Notes (Signed)
Pt transferred from main ed, presents with bizarre behavior, cursing staff and religiously preoccupied.  [facility], pt calm & cooperative, very sleepy, not forthcoming with information. Awake, alert & responsive, no distress noted.  Pt is IVC and 4-[redacted] weeks pregnant.  Monitoring for safety, Q 15 min checks in effect.

## 2015-11-12 NOTE — Progress Notes (Signed)
CSW 1st shift ED spoke with patient at beside along with CSW 2nd shift ED. Patient asked CSW to put her hair up. CSW informed patient to speak with her nurse regarding her hair being put up.   Patient offered CSW information stating she had already been informed that she would be discharged on today. Patient stated she is going to her father's home. CSW 2nd shift ED offered to [facility]. Patient stated she did not want CSW to call.  Patient stated she has to report to her Engineer, drillingrobation Officer on today by 12:00. Patient stated she did not have the contact number on her and that it may be in her car. CSW 2nd shift ED offered to call the Engineer, drillingrobation Officer. Patient did not want CSW to make a call. 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  CSW 2nd shift ED provided patient with resources for homeless shelters and food pantry.   Patient stated she had no questions for CSW at the time.    [name], LCSWA [phone] ED CSW 11/12/2015 12:04 PM

## 2015-11-12 NOTE — ED Notes (Signed)
Psychiatrist and NP met with patient this am and determined patient safe to be discharged. Denies any SI/HI or a/v hallucinations. No delusions during our conversations today and patient oriented. Her follow up provider will call her tomorrow per social work. Patient relayed this information.
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
## 2015-11-13 ENCOUNTER — Encounter: Payer: Self-pay | Admitting: Obstetrics & Gynecology

## 2015-11-15 ENCOUNTER — Encounter (HOSPITAL_COMMUNITY): Payer: Self-pay | Admitting: Emergency Medicine

## 2015-11-15 ENCOUNTER — Emergency Department (HOSPITAL_COMMUNITY)
Admission: EM | Admit: 2015-11-15 | Discharge: 2015-11-18 | Disposition: A | Discharge status: Other Acute Inpatient Hospital | Payer: Medicaid Other | Attending: Emergency Medicine | Admitting: Emergency Medicine

## 2015-11-15 DIAGNOSIS — F1410 Cocaine abuse, uncomplicated: Secondary | ICD-10-CM | POA: Diagnosis not present

## 2015-11-15 DIAGNOSIS — F29 Unspecified psychosis not due to a substance or known physiological condition: Secondary | ICD-10-CM | POA: Diagnosis not present

## 2015-11-15 DIAGNOSIS — R45851 Suicidal ideations: Secondary | ICD-10-CM | POA: Insufficient documentation

## 2015-11-15 DIAGNOSIS — R45850 Homicidal ideations: Secondary | ICD-10-CM

## 2015-11-15 DIAGNOSIS — O99331 Smoking (tobacco) complicating pregnancy, first trimester: Secondary | ICD-10-CM | POA: Insufficient documentation

## 2015-11-15 DIAGNOSIS — Z3A00 Weeks of gestation of pregnancy not specified: Secondary | ICD-10-CM | POA: Insufficient documentation

## 2015-11-15 DIAGNOSIS — F1420 Cocaine dependence, uncomplicated: Secondary | ICD-10-CM | POA: Diagnosis present

## 2015-11-15 DIAGNOSIS — Z8619 Personal history of other infectious and parasitic diseases: Secondary | ICD-10-CM | POA: Insufficient documentation

## 2015-11-15 DIAGNOSIS — F17210 Nicotine dependence, cigarettes, uncomplicated: Secondary | ICD-10-CM | POA: Insufficient documentation

## 2015-11-15 DIAGNOSIS — R4689 Other symptoms and signs involving appearance and behavior: Secondary | ICD-10-CM | POA: Diagnosis present

## 2015-11-15 DIAGNOSIS — F1494 Cocaine use, unspecified with cocaine-induced mood disorder: Secondary | ICD-10-CM | POA: Diagnosis present

## 2015-11-15 DIAGNOSIS — F1210 Cannabis abuse, uncomplicated: Secondary | ICD-10-CM | POA: Diagnosis not present

## 2015-11-15 DIAGNOSIS — O99341 Other mental disorders complicating pregnancy, first trimester: Secondary | ICD-10-CM | POA: Diagnosis not present

## 2015-11-15 DIAGNOSIS — F1220 Cannabis dependence, uncomplicated: Secondary | ICD-10-CM | POA: Diagnosis present

## 2015-11-15 LAB — COMPREHENSIVE METABOLIC PANEL
ALBUMIN: 3.5 g/dL (ref 3.5–5.0)
ALT: 34 U/L (ref 14–54)
AST: 36 U/L (ref 15–41)
Alkaline Phosphatase: 59 U/L (ref 38–126)
Anion gap: 9 (ref 5–15)
BUN: 6 mg/dL (ref 6–20)
CHLORIDE: 103 mmol/L (ref 101–111)
CO2: 26 mmol/L (ref 22–32)
CREATININE: 0.53 mg/dL (ref 0.44–1.00)
Calcium: 9.3 mg/dL (ref 8.9–10.3)
GFR calc non Af Amer: >60 mL/min (ref >60)
GLUCOSE: 113 mg/dL — AB (ref 65–99)
Potassium: 3.6 mmol/L (ref 3.5–5.1)
SODIUM: 138 mmol/L (ref 135–145)
Total Bilirubin: 0.5 mg/dL (ref 0.3–1.2)
Total Protein: 6.7 g/dL (ref 6.5–8.1)

## 2015-11-15 LAB — CBC
HCT: 30.1 % — ABNORMAL LOW (ref 36.0–46.0)
HEMOGLOBIN: 10.5 g/dL — AB (ref 12.0–15.0)
MCH: 30.8 pg (ref 26.0–34.0)
MCHC: 34.9 g/dL (ref 30.0–36.0)
MCV: 88.3 fL (ref 78.0–100.0)
Platelets: 170 10*3/uL (ref 150–400)
RBC: 3.41 MIL/uL — AB (ref 3.87–5.11)
RDW: 13.5 % (ref 11.5–15.5)
WBC: 11.5 10*3/uL — ABNORMAL HIGH (ref 4.0–10.5)

## 2015-11-15 LAB — SALICYLATE LEVEL

## 2015-11-15 LAB — I-STAT BETA HCG BLOOD, ED (MC, WL, AP ONLY)

## 2015-11-15 LAB — ETHANOL: Alcohol, Ethyl (B): <5 mg/dL (ref <5)

## 2015-11-15 LAB — ACETAMINOPHEN LEVEL: Acetaminophen (Tylenol), Serum: <10 ug/mL — ABNORMAL LOW (ref 10–30)

## 2015-11-15 LAB — HCG, QUANTITATIVE, PREGNANCY: hCG, Beta Chain, Quant, S: 43550 m[IU]/mL — ABNORMAL HIGH (ref <5)

## 2015-11-15 MED ORDER — ACETAMINOPHEN 325 MG PO TABS: 650.0000 mg | ORAL_TABLET | ORAL | Status: DC | PRN
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
## 2015-11-15 NOTE — ED Notes (Signed)
Pt has (3)shirt,dress, shoes,jacket, $16.00(cash).

## 2015-11-15 NOTE — ED Notes (Signed)
Per [employer], [facility]-[place]-states she didn't care what happened to here
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
## 2015-11-15 NOTE — ED Notes (Signed)
Elita BooneNash, RN made aware of patient HCG beta result.

## 2015-11-15 NOTE — ED Notes (Signed)
Pt AAO x 3, no distress noted, presents for medical clearance, [facility]  Pt calm & cooperative at present, Denies SI, HI or AVH.  Monitoring for safety, Q 15 min checks in effect.

## 2015-11-16 DIAGNOSIS — F1494 Cocaine use, unspecified with cocaine-induced mood disorder: Secondary | ICD-10-CM | POA: Diagnosis present

## 2015-11-16 DIAGNOSIS — R45851 Suicidal ideations: Secondary | ICD-10-CM | POA: Diagnosis not present

## 2015-11-16 LAB — URINALYSIS, ROUTINE W REFLEX MICROSCOPIC
Bilirubin Urine: NEGATIVE
Glucose, UA: NEGATIVE mg/dL
HGB URINE DIPSTICK: NEGATIVE
Ketones, ur: NEGATIVE mg/dL
NITRITE: NEGATIVE
Protein, ur: NEGATIVE mg/dL
SPECIFIC GRAVITY, URINE: 1.009 (ref 1.005–1.030)
pH: 8 (ref 5.0–8.0)

## 2015-11-16 LAB — URINE MICROSCOPIC-ADD ON: RBC / HPF: NONE SEEN RBC/hpf (ref 0–5)

## 2015-11-16 LAB — RAPID URINE DRUG SCREEN, HOSP PERFORMED
AMPHETAMINES: NOT DETECTED
BARBITURATES: NOT DETECTED
Benzodiazepines: NOT DETECTED
COCAINE: POSITIVE — AB
OPIATES: NOT DETECTED
TETRAHYDROCANNABINOL: POSITIVE — AB

## 2015-11-16 MED ORDER — DIPHENHYDRAMINE HCL 50 MG/ML IJ SOLN
50.0000 mg | Freq: Once | INTRAMUSCULAR | Status: AC
  Administered 2015-11-16: 50 mg via INTRAMUSCULAR
  Filled 2015-11-16: qty 1

## 2015-11-16 MED ORDER — CEPHALEXIN 500 MG PO CAPS
500.0000 mg | ORAL_CAPSULE | Freq: Three times a day (TID) | ORAL | Status: DC
  Administered 2015-11-16 – 2015-11-18 (×4): 500 mg via ORAL
  Filled 2015-11-16 (×4): qty 1

## 2015-11-16 NOTE — ED Notes (Signed)
Patient is alert and agitated.  Beligerent with speech and difficult to redirect.

## 2015-11-16 NOTE — ED Notes (Signed)
Threatning,grandiose, flight of ideas.  Impossible to redirect. Pacing in front of nurses desk.
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
## 2015-11-17 MED ORDER — ZIPRASIDONE MESYLATE 20 MG IM SOLR
20.0000 mg | Freq: Once | INTRAMUSCULAR | Status: AC
  Administered 2015-11-17: 20 mg via INTRAMUSCULAR
  Filled 2015-11-17: qty 20

## 2015-11-17 MED ORDER — OLANZAPINE 10 MG PO TBDP: 10.0000 mg | ORAL_TABLET | Freq: Three times a day (TID) | ORAL | Status: DC | PRN

## 2015-11-17 MED ORDER — DIPHENHYDRAMINE HCL 50 MG/ML IJ SOLN
50.0000 mg | Freq: Once | INTRAMUSCULAR | Status: AC
  Administered 2015-11-17: 50 mg via INTRAMUSCULAR
  Filled 2015-11-17: qty 1

## 2015-11-17 NOTE — ED Notes (Signed)
Patient loud and beligerent, cursing staff. Impossible to redirect.

## 2015-11-17 NOTE — ED Notes (Signed)
Patient denies SI, HI and AVH at this time. Patient calm and cooperative at this time. Encouragement and support provided and safety maintain. Q 15 min safety checks remain in place. 
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
## 2015-11-17 NOTE — Progress Notes (Signed)
D Pt. Has been in bed this pm and appearing to rest  Comfortably.  Pt. Did wake up for VS and went to the bathroom, allowing for assessment.  Pt. Denied SI and HI, no complaints of pain or discomfort noted at present time.  A Writer offered support and encouragement.   R Pt. Remains safe on the unit.

## 2015-11-17 NOTE — Progress Notes (Signed)
Entered in d/c instructions medicaid coverage USE THIS WEBSITE TO ASSIST WITH UNDERSTANDING YOUR COVERAGE, [place] Transportation to Dr appts if you are have full Medicaid: [phone], [phone] or [phone] Transportation Supervisor [phone] As a Medicaid client you MUST contact DSS/SSI each time you change address, move to another [place] or another state to keep your address updated  [place]: [phone] [address] [place], [place] [address] [email or web address]

## 2015-11-17 NOTE — BH Assessment (Signed)
[facility] Assessment Progress Note  The following facilities have been contacted to seek placement for this pt, with results as noted:  Beds available, information sent, decision pending:  [facility] [place]   At capacity:  [facility]   Doylene Canninghomas [place], [place] Triage Specialist [phone]

## 2015-11-17 NOTE — ED Notes (Signed)
Patient is loud,belligerent and impossible to redirect.  Verbally threatning.  Provider informed.

## 2015-11-17 NOTE — ED Notes (Signed)
[facility].

## 2015-11-18 ENCOUNTER — Inpatient Hospital Stay (HOSPITAL_COMMUNITY)
Admission: AD | Admit: 2015-11-18 | Discharge: 2015-11-24 | DRG: 885 | Disposition: A | Discharge status: Home / Self Care | Payer: Medicaid Other | Source: Intra-hospital | Attending: Psychiatry | Admitting: Psychiatry

## 2015-11-18 ENCOUNTER — Encounter (HOSPITAL_COMMUNITY): Payer: Self-pay | Admitting: *Deleted

## 2015-11-18 DIAGNOSIS — F1410 Cocaine abuse, uncomplicated: Secondary | ICD-10-CM | POA: Diagnosis present

## 2015-11-18 DIAGNOSIS — N390 Urinary tract infection, site not specified: Secondary | ICD-10-CM | POA: Diagnosis present

## 2015-11-18 DIAGNOSIS — F419 Anxiety disorder, unspecified: Secondary | ICD-10-CM | POA: Diagnosis present

## 2015-11-18 DIAGNOSIS — F1994 Other psychoactive substance use, unspecified with psychoactive substance-induced mood disorder: Secondary | ICD-10-CM | POA: Diagnosis not present

## 2015-11-18 DIAGNOSIS — Z811 Family history of alcohol abuse and dependence: Secondary | ICD-10-CM

## 2015-11-18 DIAGNOSIS — G4700 Insomnia, unspecified: Secondary | ICD-10-CM | POA: Diagnosis present

## 2015-11-18 DIAGNOSIS — Z9119 Patient's noncompliance with other medical treatment and regimen: Secondary | ICD-10-CM

## 2015-11-18 DIAGNOSIS — R45851 Suicidal ideations: Secondary | ICD-10-CM | POA: Diagnosis present

## 2015-11-18 DIAGNOSIS — F250 Schizoaffective disorder, bipolar type: Secondary | ICD-10-CM | POA: Diagnosis not present

## 2015-11-18 DIAGNOSIS — Z331 Pregnant state, incidental: Secondary | ICD-10-CM | POA: Diagnosis present

## 2015-11-18 DIAGNOSIS — F1420 Cocaine dependence, uncomplicated: Secondary | ICD-10-CM | POA: Diagnosis present

## 2015-11-18 DIAGNOSIS — Z3491 Encounter for supervision of normal pregnancy, unspecified, first trimester: Secondary | ICD-10-CM

## 2015-11-18 DIAGNOSIS — F1220 Cannabis dependence, uncomplicated: Secondary | ICD-10-CM | POA: Diagnosis present

## 2015-11-18 DIAGNOSIS — F259 Schizoaffective disorder, unspecified: Secondary | ICD-10-CM

## 2015-11-18 DIAGNOSIS — Z87891 Personal history of nicotine dependence: Secondary | ICD-10-CM

## 2015-11-18 DIAGNOSIS — F17200 Nicotine dependence, unspecified, uncomplicated: Secondary | ICD-10-CM | POA: Diagnosis present

## 2015-11-18 DIAGNOSIS — O99341 Other mental disorders complicating pregnancy, first trimester: Secondary | ICD-10-CM | POA: Diagnosis not present

## 2015-11-18 DIAGNOSIS — F1210 Cannabis abuse, uncomplicated: Secondary | ICD-10-CM | POA: Diagnosis present

## 2015-11-18 MED ORDER — CEPHALEXIN 500 MG PO CAPS
500.0000 mg | ORAL_CAPSULE | Freq: Three times a day (TID) | ORAL | Status: DC
  Administered 2015-11-19 – 2015-11-24 (×16): 500 mg via ORAL
  Filled 2015-11-18 (×16): qty 1
  Filled 2015-11-18: qty 2
  Filled 2015-11-18 (×7): qty 1
  Filled 2015-11-18: qty 2
  Filled 2015-11-18: qty 1

## 2015-11-18 MED ORDER — ACETAMINOPHEN 325 MG PO TABS
650.0000 mg | ORAL_TABLET | Freq: Four times a day (QID) | ORAL | Status: DC | PRN
Start: 2015-11-18 — End: 2015-11-24
  Administered 2015-11-18 – 2015-11-23 (×4): 650 mg via ORAL
  Filled 2015-11-18 (×4): qty 2

## 2015-11-18 MED ORDER — MAGNESIUM HYDROXIDE 400 MG/5ML PO SUSP: 30.0000 mL | Freq: Every day | ORAL | Status: DC | PRN

## 2015-11-18 MED ORDER — OLANZAPINE 10 MG PO TBDP: 10.0000 mg | ORAL_TABLET | Freq: Three times a day (TID) | ORAL | Status: DC | PRN

## 2015-11-18 MED ORDER — ALUM & MAG HYDROXIDE-SIMETH 200-200-20 MG/5ML PO SUSP: 30.0000 mL | ORAL | Status: DC | PRN

## 2015-11-18 NOTE — ED Notes (Signed)
Patient resting in bed. Restless, fidgety when aroused. Denies SI, HI, AVH. Appears drowsy/withdrawn; flat. Denies any anxiety or depression.   Encouragement offered. [facility].  Q 15 safety checks continue.

## 2015-11-18 NOTE — ED Notes (Signed)
Patient has stayed in her room most of the day.  She has been irritable at times, but not bothering people.  Her appetite is good and she is attending to hygiene.

## 2015-11-18 NOTE — BH Assessment (Signed)
[facility] Assessment Progress Note  Per Thedore MinsMojeed Akintayo, MD, this pt requires psychiatric hospitalization at this time.  Rosey BathKelly Southard, RN, [facility] R[facility].  Pt is under IVC.  She refuses to sign Consent to Release Information.  [facility]C documents [facility].  Pt's nurse, Rudean HittDawnaly, has been notified and agrees to call report to [phone].  Pt is to be transported via Patent examinerlaw enforcement.  Doylene Canninghomas Shatira Dobosz, MA Triage Specialist [phone]

## 2015-11-19 ENCOUNTER — Encounter (HOSPITAL_COMMUNITY): Payer: Self-pay | Admitting: Psychiatry

## 2015-11-19 DIAGNOSIS — F259 Schizoaffective disorder, unspecified: Secondary | ICD-10-CM

## 2015-11-19 MED ORDER — OLANZAPINE 5 MG PO TBDP
5.0000 mg | ORAL_TABLET | Freq: Every day | ORAL | Status: DC
  Filled 2015-11-19 (×4): qty 1

## 2015-11-19 MED ORDER — CLONAZEPAM 0.5 MG PO TABS
0.5000 mg | ORAL_TABLET | Freq: Two times a day (BID) | ORAL | Status: DC | PRN
  Administered 2015-11-20: 0.5 mg via ORAL
  Filled 2015-11-19: qty 1

## 2015-11-19 MED ORDER — OLANZAPINE 5 MG PO TBDP: 5.0000 mg | ORAL_TABLET | Freq: Three times a day (TID) | ORAL | Status: DC | PRN
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
## 2015-11-19 NOTE — [facility] Group Notes (Signed)
   [facility] LCS[facility] Aftercare Discharge Planning Group Note  11/19/2015  8:45 AM   Participation Quality: Alert, Appropriate and Oriented  Mood/Affect: Agitated  Depression Rating: Reports low depression level today  Anxiety Rating: Reports low anxiety level today  Thoughts of Suicide: Pt denies SI/HI  Will you contract for safety? Yes  Current AVH: Pt denies  Plan for Discharge/Comments: Pt attended discharge planning group and actively participated in group. CSW provided pt with today's workbook. Patient reports feeling "wonderful" today. She appears guarded and agitated. She will likely return home with outpatient services at discharge.   Transportation Means: Pt reports access to transportation  Supports: No supports mentioned at this time  Samuella BruinKristin Hildegard Hlavac, MSW, [employer]CSWA Clinical Social Worker [facility] [phone]
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
## 2015-11-19 NOTE — Tx Team (Signed)
Initial Interdisciplinary Treatment Plan   PATIENT STRESSORS: Financial difficulties Legal issue Marital or family conflict Substance abuse Traumatic event   PATIENT STRENGTHS: Average or above average intelligence Communication skills General fund of knowledge Religious Affiliation   PROBLEM LIST: Problem List/Patient Goals Date to be addressed Date deferred Reason deferred Estimated date of resolution  "my attitude"  11/18/15     "more coping skills"  11/18/15           SI 11/18/15     psychosis 11/18/15                              DISCHARGE CRITERIA:  Improved stabilization in mood, thinking, and/or behavior Motivation to continue treatment in a less acute level of care  PRELIMINARY DISCHARGE PLAN: Outpatient therapy  PATIENT/FAMIILY INVOLVEMENT: This treatment plan has been presented to and reviewed with the patient, Makayla Medina.  The patient and family have been given the opportunity to ask questions and make suggestions.  [name], [name] 11/19/2015, 12:41 AM

## 2015-11-19 NOTE — [facility] Group Notes (Signed)
[facility] LCSW Group Therapy 11/19/2015  1:15 PM   Type of Therapy: Group Therapy  Participation Level: Did Not Attend. Patient invited to participate but declined.   Avnoor Koury, MSW, LCSWA Clinical Social Worker  [facility] [phone]   

## 2015-11-19 NOTE — Progress Notes (Signed)
D:Per patient self inventory form pt reports she slept fair last night. She reports a fair appetite, normal energy level, good concentration. She rates depression 0/10, hopelessness 0/10, anxiety 0/10- all on 0-10 scale, 10 being the worse. Pt denies SI/HI. Denies AVH. Pt reports her goal is "learning how to keep my mouth shut" and she will meet her goal by "don't know." Pt presents with paranoia. Noted pacing the hallway at times. Behavior cooperative on approach.  A:Special checks q 15 mins in place for safety. (see eMAR) Encouragement and support provided.  R:Safety maintained. Compliant with medication regimen. Will continue to monitor.

## 2015-11-19 NOTE — [facility] Suicide Risk Assessment (Signed)
[facility] INPATIENT:  Family/Significant Other Suicide Prevention Education  Suicide Prevention Education:  Patient Refusal for Family/Significant Other Suicide Prevention Education: The patient Makayla Medina has refused to provide written consent for family/significant other to be provided Family/Significant Other Suicide Prevention Education during admission and/or prior to discharge.  Physician notified. SPE reviewed with patient and brochure provided. Patient encouraged to return to hospital if having suicidal thoughts, patient verbalized his/her understanding and has no further questions at this time.   Makayla Medina, Makayla Medina 11/19/2015, 4:08 PM

## 2015-11-20 MED ORDER — COMPLETENATE 29-1 MG PO CHEW
1.0000 | CHEWABLE_TABLET | Freq: Every day | ORAL | Status: DC
  Filled 2015-11-20: qty 1

## 2015-11-20 MED ORDER — OLANZAPINE 10 MG PO TBDP
10.0000 mg | ORAL_TABLET | Freq: Every day | ORAL | Status: DC
Start: 2015-11-20 — End: 2015-11-24
  Administered 2015-11-20 – 2015-11-23 (×4): 10 mg via ORAL
  Filled 2015-11-20 (×7): qty 1

## 2015-11-20 MED ORDER — DIPHENHYDRAMINE HCL 25 MG PO CAPS: 25.0000 mg | ORAL_CAPSULE | Freq: Three times a day (TID) | ORAL | Status: DC | PRN

## 2015-11-20 MED ORDER — BACITRACIN-NEOMYCIN-POLYMYXIN OINTMENT TUBE
TOPICAL_OINTMENT | CUTANEOUS | Status: DC | PRN
  Filled 2015-11-20: qty 15

## 2015-11-20 NOTE — Progress Notes (Signed)
[facility] Group Notes:  (Nursing/MHT/Case Management/Adjunct)  Date:  11/20/2015  Time:  9:37 PM  Type of Therapy:  Psychoeducational Skills  Participation Level:  Active  Participation Quality:  Attentive  Affect:  Angry and Labile  Cognitive:  Disorganized  Insight:  Good  Engagement in Group:  Developing/Improving  Modes of Intervention:  Education  Summary of Progress/Problems: The patient verbalized in group that she had a good day overall until she had a disagreement with a staff member. The patient's goal for tomorrow is to work on her attitude.   Hazle CocaGOODMAN, Tameca Jerez S 11/20/2015, 9:37 PM

## 2015-11-20 NOTE — Progress Notes (Signed)
Pt went to dining room for dinner, tolerated well. Will continue to monitor.  

## 2015-11-20 NOTE — Progress Notes (Signed)
D: Pt denies SI/HI denies AVH. Pt has minimal interaction with this nurse . Pt attended nursing group this morning and participated fair. Pt continues to present with paranoia. Reports she does not trust anyone with blue eyes and blonde hair.  A:Special checks q 15 mins in place for safety. Encouragement and support provided.  R:Safety maintained. Will continue to monitor.
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
## 2015-11-20 NOTE — Progress Notes (Signed)
D: Pt has depressed affect and mood.  She reports her day has been "okay" and that her goal tonight is to "try not to have bad dreams."  Pt denies SI/HI, denies hallucinations, denies pain.  Pt has stayed in her room for the majority of the night and she did not attend evening group.  Pt continues to be paranoid and prefers beverages that she opens herself.   A: Met with pt 1:1 and provided support and encouragement.  Medications offered per order. Pt provided with juice that she could open per pt's request.   R: Pt refused zyprexa tonight, reporting that she didn't know enough about it.  Pt provided with information sheet on medication.  She was compliant with Keflex. Pt verbally contracts for safety and reports that she will notify staff of needs and concerns.  Will continue to monitor and assess.

## 2015-11-20 NOTE — Progress Notes (Signed)
Reported to this nurse by nursing staff that when in line on unit for lunch pt misidentifying nursing staff and becoming increasingly agitated. Question MD in regards to medication and current pregnancy status. MD confirms ok for pt to receive current PRN orders. Pt reports all questions in regards to medication have been answered by MD at this time. Pt has no questions for this nurse.  PRN Klonopin given per MD order and pt request.

## 2015-11-20 NOTE — [facility] Group Notes (Signed)
[facility] LCSW Group Therapy 11/20/2015  1:15 PM   Type of Therapy: Group Therapy  Participation Level: Did Not Attend. Patient invited to participate but declined.   Sehaj Mcenroe, MSW, LCSWA Clinical Social Worker [facility] [phone]   

## 2015-11-21 MED ORDER — PRENATAL MULTIVITAMIN CH
1.0000 | ORAL_TABLET | Freq: Every day | ORAL | Status: DC
  Administered 2015-11-21 – 2015-11-24 (×4): 1 via ORAL
  Filled 2015-11-21 (×5): qty 1

## 2015-11-21 NOTE — [facility] Group Notes (Signed)
[facility] LCSW Group Therapy 11/21/2015  1:15 PM   Type of Therapy: Group Therapy  Participation Level: Did Not Attend. Patient invited to participate but declined.   Kandace Elrod, MSW, LCSWA Clinical Social Worker [facility] [phone]   

## 2015-11-21 NOTE — Progress Notes (Signed)
D: Patient in bed on approach.  Patient states she has been feeling tired today.  Patient states she is ready to be discharged but states she was told she will probably be discharged Tuesday.  Patient states she is going to try to make the best of it.  Patient denies SI/HI and denies AVH.  Patient does appear sad and depressed. A: Staff to monitor Q 15 mins for safety.  Encouragement and support offered.  Scheduled medications administered per orders. R: Patient remains safe on the unit.  Patient did not attend group tonight.  Patient not visible on the unit tonight.  Patient taking administered medications.
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
## 2015-11-21 NOTE — [facility] Group Notes (Signed)
Patient did not attend group.  Makayla Medina, MHT 
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
## 2015-11-21 NOTE — [facility] Group Notes (Addendum)
[facility] Group Notes:  (Nursing/MHT/Case Management/Adjunct)  Date:  11/21/2015  Time:  12:13 PM  Type of Therapy:  Nurse Education  Participation Level:  Pt came in on the last 5 minutes of group.  Participation Quality:  None.  Affect:  Flat, quiet.  Cognitive:  n/a  Insight:  n/a- unable to assess.  Engagement in Group:  None  Modes of Intervention:  Discussion, support, engagement and problem solving.  Summary of Progress/Problems: Nursing Psychoeducational group held regarding Relapse Prevention and healthy coping skills.    [name], Johnelle Tafolla Katherine 11/21/2015, 12:13 PM

## 2015-11-22 DIAGNOSIS — F259 Schizoaffective disorder, unspecified: Secondary | ICD-10-CM
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
## 2015-11-22 NOTE — Plan of Care (Signed)
Problem: Alteration in thought process Goal: LTG-Patient has not harmed self or others in at least 2 days Outcome: Progressing Patient has not harmed herself or other in the past 3 day.  Patient has been cooperative with staff.

## 2015-11-22 NOTE — [facility] Group Notes (Signed)
[facility] Group Notes:  (Nursing/MHT/Case Management/Adjunct)  Date:  11/22/2015  Time:  12:05 PM  Type of Therapy:  Psychoeducational Skills  Participation Level:  Active  Participation Quality:  Appropriate  Affect:  Depressed and Flat  Cognitive:  Appropriate  Insight:  Appropriate  Engagement in Group:  Supportive and engaged in discussion  Modes of Intervention:  Discussion, Education, Socialization and Support  Summary of Progress/Problems: Nursing Psycho-education- Topic:  healthy communication   Reviewed Maslow Hierarchy of needs and love languages.  [name], [name] 11/22/2015, 12:05 PM

## 2015-11-22 NOTE — Progress Notes (Signed)
Psychoeducational Group Note  Date:  11/22/2015 Time:  2228  Group Topic/Focus:  Wrap-Up Group:   The focus of this group is to help patients review their daily goal of treatment and discuss progress on daily workbooks.  Participation Level: Did Not Attend  Participation Quality:  Not Applicable  Affect:  Not Applicable  Cognitive:  Not Applicable  Insight:  Not Applicable  Engagement in Group: Not Applicable  Additional Comments:  The patient did not attend group this evening since she was asleep.  Hazle CocaGOODMAN, Jamarie Mussa S 11/22/2015, 10:28 PM

## 2015-11-22 NOTE — Progress Notes (Signed)
D: Patient in the bed on approach.  Patient states she has been sleeping a lot to pass the time.  Patient denies SI/HI and denies AVH.  Patient states she plans to go live with her stepfather when discharged.  Patient has been isolative and sad. A: Staff to monitor Q 15 mins for safety.  Encouragement and support offered.  Scheduled medications administered per orders. R: Patient remains safe on the unit.  Patient did not attend group tonight.  Patient not visible on the unit.  Patient taking administered medications.

## 2015-11-22 NOTE — Progress Notes (Signed)
Nursing Note: 0700-1900  D:  Pt presents depressed and quiet. "I just want to go home, I am sick of this place- I hate being locked up."  Pt came to group today, initially did not want to interact but then did contribute to discussion.  A:  Encouraged to verbalize needs and concerns, active listening and support provided.  Continued Q 15 minute safety checks.    R:  Pt. Is calm and cooperative.  Denies A/V hallucinations and is able to verbally contract for safety. Pt takes intermittent naps throughout shift. Minimal verbal interaction noted today.

## 2015-11-22 NOTE — [facility] Group Notes (Signed)
[facility] Group Notes: (Clinical Social Work)   11/22/2015      Type of Therapy:  Group Therapy   Participation Level:  Did Not Attend despite MHT prompting   Ambrose MantleMareida Grossman-Orr, LCSW 11/22/2015, 12:27 PM

## 2015-11-23 DIAGNOSIS — F250 Schizoaffective disorder, bipolar type: Principal | ICD-10-CM

## 2015-11-23 DIAGNOSIS — F1994 Other psychoactive substance use, unspecified with psychoactive substance-induced mood disorder: Secondary | ICD-10-CM

## 2015-11-23 NOTE — Progress Notes (Signed)
Pt c/o abdominal pain 5/10, reports this pain "always happens" Pt instructed to tell  Nursing staff if pain increases or any vaginal discharge occurs. "It's nothing like that." Pt verbalizes understanding. Will continue to monitor.

## 2015-11-23 NOTE — Progress Notes (Signed)
Adult Psychoeducational Group Note  Date:  11/23/2015 Time:  8:25 PM  Group Topic/Focus:  Wrap-Up Group:   The focus of this group is to help patients review their daily goal of treatment and discuss progress on daily workbooks.  Participation Level:  Did Not Attend  Pt was asleep during wrap-up group.    Makayla Medina 11/23/2015, 8:50 PM

## 2015-11-23 NOTE — Progress Notes (Signed)
D:Per patient self inventory form pt reports she slept good last night. Pt reports a good appetite, normal energy level, good concentration. Pt rates depression 0/10, hopelessness 0/10, anxiety 0/10- all on 0-10 scale, 10 being the worse. Pt denies SI/HI. Denies AVH. Pt reports her goal is "attitude" and that she will meet her goal by "keep calm" Pt forwards little with this nurse. Paranoid thought content. Pt refusing to drink water, requesting drink from a can.  A:Special checks q 15 mins in place for safety. Medication administered per MD order (see eMAR) Encouragement and support provided.  R:Safety maintained. Compliant with medication regimen. Will continue to monitor.

## 2015-11-23 NOTE — [facility] Group Notes (Signed)
[facility] Group Notes:  (Clinical Social Work)  11/23/2015  [facility] Group Notes:  (Clinical Social Work)  11/23/2015  11:00AM-12:00PM  Summary of Progress/Problems:  The main focus of today's process group was to listen to a variety of genres of music and to identify that different types of music provoke different responses.  The patient then was able to identify personally what was soothing for them, as well as energizing.  HThe patient expressed understanding of concepts, as well as knowledge of how each type of music affected her and how this can be used at home as a wellness/recovery tool.  When another patient requested a song she did not like, another patient was able to convince her to not leave the room and clinician was able to get her to request a song herself that she would like.  Type of Therapy:  Music Therapy   Participation Level:  Active  Participation Quality:  Attentive and Sharing  Affect:  Blunted  Cognitive:  Oriented  Insight:  Engaged  Engagement in Therapy:  Engaged  Modes of Intervention:   Activity, Exploration  Ambrose MantleMareida Grossman-Orr, LCSW 11/23/2015
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
## 2015-11-23 NOTE — Progress Notes (Signed)
Patient had to come back from breakfast this morning complaining of dizziness.  Patient states she ate her breakfast and drank 2 cups of orange juice.  Patient got back in bed and stated she felt better.  Patient states she is going to rest.

## 2015-11-24 DIAGNOSIS — Z3491 Encounter for supervision of normal pregnancy, unspecified, first trimester: Secondary | ICD-10-CM

## 2015-11-24 DIAGNOSIS — F1420 Cocaine dependence, uncomplicated: Secondary | ICD-10-CM

## 2015-11-24 DIAGNOSIS — F250 Schizoaffective disorder, bipolar type: Secondary | ICD-10-CM | POA: Diagnosis present

## 2015-11-24 DIAGNOSIS — F1220 Cannabis dependence, uncomplicated: Secondary | ICD-10-CM

## 2015-11-24 MED ORDER — OLANZAPINE 10 MG PO TBDP: 10.0000 mg | ORAL_TABLET | Freq: Every day | ORAL | Status: DC

## 2015-11-24 MED ORDER — CEPHALEXIN 500 MG PO CAPS: 500.0000 mg | ORAL_CAPSULE | Freq: Three times a day (TID) | ORAL | Status: DC

## 2015-11-24 MED ORDER — BACITRACIN-NEOMYCIN-POLYMYXIN OINTMENT TUBE: 1.0000 "application " | TOPICAL_OINTMENT | CUTANEOUS | Status: DC | PRN

## 2015-11-24 NOTE — [facility] Suicide Risk Assessment (Signed)
[facility]   Demographic Factors:  Low socioeconomic status  Total Time spent with patient: 30 minutes  Musculoskeletal: Strength & Muscle Tone: within normal limits Gait & Station: normal Patient leans: N/A  Psychiatric Specialty Exam: Physical Exam  Review of Systems  Psychiatric/Behavioral: Positive for substance abuse. Negative for depression and hallucinations. The patient is not nervous/anxious.   All other systems reviewed and are negative.   Blood pressure 96/53, pulse 94, temperature 97.7 F (36.5 C), temperature source Oral, resp. rate 20, height 5\' 6"  (1.676 m), weight 74.39 kg (164 lb), SpO2 100 %, currently breastfeeding.Body mass index is 26.48 kg/(m^2).  General Appearance: Casual  Eye Contact::  Fair  Speech:  Clear and Coherent409  Volume:  Normal  Mood:  Euthymic  Affect:  Appropriate  Thought Process:  Coherent  Orientation:  Full (Time, Place, and Person)  Thought Content:  WDL  Suicidal Thoughts:  No  Homicidal Thoughts:  No  Memory:  Immediate;   Fair Recent;   Fair Remote;   Fair  Judgement:  Fair  Insight:  Fair  Psychomotor Activity:  Normal  Concentration:  Fair  Recall:  FiservFair  Fund of Knowledge:Fair  Language: Fair  Akathisia:  No  Handed:  Right  AIMS (if indicated):     Assets:  Communication Skills Desire for Improvement  Sleep:  Number of Hours: 7.25  Cognition: WNL  ADL's:  Intact   Have you used any form of tobacco in the last 30 days? (Cigarettes, Smokeless Tobacco, Cigars, and/or Pipes): No  Has this patient used any form of tobacco in the last 30 days? (Cigarettes, Smokeless Tobacco, Cigars, and/or Pipes) No  Mental Status Per Nursing Assessment::   On Admission:     Current Mental Status by Physician: Pt denies SI/HI/AH/VH  Loss Factors: NA  Historical Factors: Impulsivity  Risk Reduction Factors:   Positive social support  Continued Clinical Symptoms:  Alcohol/Substance  Abuse/Dependencies Previous Psychiatric Diagnoses and Treatments Medical Diagnoses and Treatments/Surgeries  Cognitive Features That Contribute To Risk:  None    Suicide Risk:  Minimal: No identifiable suicidal ideation.  Patients presenting with no risk factors but with morbid ruminations; may be classified as minimal risk based on the severity of the depressive symptoms  Principal Problem: Schizoaffective disorder, bipolar type ([facility]) ( ACUTE PHASE RESOLVED) Discharge Diagnoses:  Patient Active Problem List   Diagnosis Date Noted  . Schizoaffective disorder, bipolar type ([facility]) [F25.0] 11/24/2015  . First trimester pregnancy [Z33.1] 11/24/2015  . Supervision of high-risk pregnancy [O09.90] 10/08/2015  . 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 Cannabis use disorder, moderate, dependence ([facility]) [F12.20] 07/23/2015  . Cocaine use disorder, severe, dependence ([facility]) [F14.20] 07/22/2015    Follow-up Information    [facility]   Why:  Resume your regular services with them when you d/c from the hospital   Contact information:   [address]  [place] [336] [phone]      Plan Of Care/Follow-up recommendations:  Activity:  No restrictions Diet:  regular Tests:  as per out patient recommendations Other:  none  Is patient on multiple antipsychotic therapies at discharge:  No   Has Patient had three or more failed trials of antipsychotic monotherapy by history:  No  Recommended Plan for Multiple Antipsychotic Therapies: NA    Bertran Zeimet MD 11/24/2015, 11:25 AM

## 2015-11-24 NOTE — Progress Notes (Signed)
Patient discharged home with prescriptions. Patient was stable and appreciative at that time. All papers and prescriptions were given and valuables returned. Verbal understanding expressed. Denies SI/HI and A/VH. Patient given opportunity to express concerns and ask questions.  

## 2015-11-24 NOTE — Progress Notes (Signed)
  [facility] :  Will you be returning to the same living situation after discharge:  Yes,  home At discharge, do you have transportation home?: Yes,  family Do you have the ability to pay for your medications: Yes,  MCD  Release of information consent forms completed and in the chart;  Patient's signature needed at discharge.  Patient to Follow up at: Follow-up Information    [facility]   Why:  Resume your regular services with them when you d/c from the hospital   Contact information:   [address]  [place] [phone]      Next level of care provider [facility] Link:no  Safety Planning and Suicide Prevention discussed: Yes,  yes  Have you used any form of tobacco in the last 30 days? (Cigarettes, Smokeless Tobacco, Cigars, and/or Pipes): No  Has patient been referred to the Quitline?: N/A patient is not a smoker  Patient has been referred for addiction treatment: Yes  Daryel Geraldorth, Rithvik Orcutt B 11/24/2015, 10:52 AM
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
## 2015-11-24 NOTE — Progress Notes (Signed)
D: Patient alert and oriented x 4. Patient denies pain/SI/HI/AVH. Patient isolated self in room during evening part of shift. Patient stayed in bed and only got up to take HS medications.  A: Staff to monitor Q 15 mins for safety. Encouragement and support offered. Scheduled medications administered per orders. R: Patient remains safe on the unit. Patient visible on the unit. Patient taking administered medications.

## 2015-11-24 NOTE — Discharge Summary (Signed)
Physician Discharge Summary Note  Patient:  Makayla Medina is an 30 y.o., female MRN:  [id number] DOB:  Oct 16, 1986 Patient phone:  [phone] (home)  Patient address:   [address] [place] [address],  Total Time spent with patient: 45 minutes  Date of Admission:  11/18/2015 Date of Discharge: 11/24/2015  Reason for Admission: Makayla Medina is a 30 year old female with a history of Schizoaffective Disorder, bipolar type who was brought to [facility] via police after she was found wandering in traffic at the [employer] making suicidal comments. Also it was reported that patient was walking into traffic with a knife declaring that she is a member of [employer]. It is reported in epic that she also made verbal threats to staff at a [facility]. Patient has not been taking her psychotropic medications for an unknown period of time. When last discharged from [facility] on 07/22/2015 the patient had been stabilized on haldol. Patient has had difficulty participating in assessments due to agitation. Today during assessment the patient becomes very angry when speaking about her stressors stating "Something happened in that ambulance. I have a chipped tooth and I hurt my ankle. I think somebody put a hit on me! Maybe I saved myself by getting out of the ambulance. My mother wants to kill me. So does Trump or [employer] [place]. I used to work for [employer] so I know. Can't I be mad if somebody tried to kill me? They are probably going to kill my daughter too because they know I love her! And I'm pregnant with a Muslim man's baby who is married. If it's a boy he will try to steal the child to take back to [place]. Nobody understand! Nobody has taken the time to see this all traces back to my mother. If people would leave me alone then I would not get violent. They keep giving me shots of Haldol. The only diagnoses I have is being too religious. 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 If somebody tries to kill you then the cocaine and marijuana helped my pain that I was left with!" Makayla Medina reports Haldol was effective but it is not advised during pregnancy. Discussed case with Dr. Jama Flavors who also was present during the psychiatric assessment. Patient needs mood stabilization due to psychotic symptoms that are interfering with her ability to function normally. Her urine drug screen are positive for cocaine and marijuana. Patient will be started on low dose Zyprexa Zydis and Klonopin to help stabilize her mood.   Principal Problem: Schizoaffective disorder, bipolar type [facility]) Discharge Diagnoses: Patient Active Problem List   Diagnosis Date Noted  . Schizoaffective disorder, bipolar type (HCC) [F25.0] 11/24/2015  . First trimester pregnancy [Z33.1] 11/24/2015  . Supervision of high-risk pregnancy [O09.90] 10/08/2015  . 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 Cannabis use disorder, moderate, dependence (HCC) [F12.20] 07/23/2015  . Cocaine use disorder, severe, dependence (HCC) [F14.20] 07/22/2015    Musculoskeletal: Strength & Muscle Tone: within normal limits Gait & Station: normal Patient leans: N/A  Psychiatric Specialty Exam:  SEE SRA Physical Exam  Vitals reviewed. Psychiatric: Her mood appears anxious. Thought content is not paranoid and not delusional. She does not exhibit a depressed mood. She expresses no homicidal and no suicidal ideation.    Review of  Systems  All other systems reviewed and are negative.   Blood pressure 96/53, pulse 94, temperature 97.7 F (36.5 C), temperature source Oral, resp. rate 20, height 5\' 6"  (1.676 m), weight 74.39 kg (164 lb), SpO2 100 %, currently breastfeeding.Body mass index is 26.48 kg/(m^2).  Have you used any form of tobacco in the last 30 days?  (Cigarettes, Smokeless Tobacco, Cigars, and/or Pipes): No  Has this patient used any form of tobacco in the last 30 days? (Cigarettes, Smokeless Tobacco, Cigars, and/or Pipes) N/A  Past Medical History:  Past Medical History  Diagnosis Date  . Anxiety   . Bipolar disorder (HCC)   . Vaginal Pap smear, abnormal   . Gonorrhea   . Chlamydia   . Schizophrenia [facility])     Past Surgical History  Procedure Laterality Date  . Dilation and curettage of uterus    . Colposcopy     Family History:  Family History  Problem Relation Age of Onset  . Alcoholism Mother    Social History:  History  Alcohol Use No    Comment: socially     History  Drug Use  . Yes  . Special: Marijuana, Cocaine    Comment: 3 days ago. Uses once a week.    Social History   Social History  . Marital Status: Single    Spouse Name: N/A  . Number of Children: N/A  . Years of Education: N/A   Social History Main Topics  . Smoking status: Former Smoker -- 0.25 packs/day    Types: Cigarettes  . Smokeless tobacco: None  . Alcohol Use: No     Comment: socially  . Drug Use: Yes    Special: Marijuana, Cocaine     Comment: 3 days ago. Uses once a week.  Marland Kitchen Sexual Activity: Not Currently     Comment: pt will niot answer   Other Topics Concern  . None   Social History Narrative   Risk to Self: Is patient at risk for suicide?: Yes Risk to Others:   Prior Inpatient Therapy:   Prior Outpatient Therapy:    Level of Care:  OP  Hospital Course:   Makayla Medina was admitted for Schizoaffective disorder, bipolar type (HCC) , with psychosis and crisis management.  Pt was treated discharged with the medications listed below under Medication List.  Medical problems were identified and treated as needed.  Home medications were restarted as appropriate.  Improvement was monitored by observation and Makayla Medina 's daily report of symptom reduction.  Emotional and mental status was monitored by daily  self-inventory reports completed by Makayla Medina and clinical staff.         Makayla Medina was evaluated by the treatment team for stability and plans for continued recovery upon discharge. Makayla Medina 's motivation was an integral factor for scheduling further treatment. Employment, transportation, bed availability, health status, family support, and any pending legal issues were also considered during hospital stay. Pt was offered further treatment options upon discharge including but not limited to Residential, Intensive Outpatient, and Outpatient treatment.  Makayla Medina will follow up with the services as listed below under Follow Up Information.     Upon completion of this admission the patient was both mentally and medically stable for discharge denying suicidal/homicidal ideation, auditory/visual/tactile hallucinations, delusional thoughts and paranoia.    Makayla Medina responded well to treatment with Zyprexa without adverse effects. Pt demonstrated improvement without reported or observed adverse effects to the point of stability appropriate for outpatient  management. Pertinent labs include: UDS+ cocaine/THC , HCG 43,550 (pt is pregnant). for which outpatient follow-up is necessary for lab recheck as mentioned below. Reviewed CBC, CMP, BAL, and UDS; all unremarkable aside from noted exceptions.   Discharge Vitals:   Blood pressure 96/53, pulse 94, temperature 97.7 F (36.5 C), temperature source Oral, resp. rate 20, height 5\' 6"  (1.676 m), weight 74.39 kg (164 lb), SpO2 100 %, currently breastfeeding. Body mass index is 26.48 kg/(m^2). Lab Results:   No results found for this or any previous visit (from the past 72 hour(s)).  Physical Findings: AIMS: Facial and Oral Movements Muscles of Facial Expression: None, normal Lips and Perioral Area: None, normal Jaw: None, normal Tongue: None, normal,Extremity Movements Upper (arms, wrists, hands, fingers): None,  normal Lower (legs, knees, ankles, toes): None, normal, Trunk Movements Neck, shoulders, hips: None, normal, Overall Severity Severity of abnormal movements (highest score from questions above): None, normal Incapacitation due to abnormal movements: None, normal Patient's awareness of abnormal movements (rate only patient's report): No Awareness, Dental Status Current problems with teeth and/or dentures?: No Does patient usually wear dentures?: No  CIWA:  CIWA-Ar Total: 1 COWS:  COWS Total Score: 3   See Psychiatric Specialty Exam and Suicide Risk Assessment completed by Attending Physician prior to discharge.  Discharge destination:  Home  Is patient on multiple antipsychotic therapies at discharge:  No   Has Patient had three or more failed trials of antipsychotic monotherapy by history:  No    Recommended Plan for Multiple Antipsychotic Therapies: NA     Medication List    TAKE these medications      Indication   cephALEXin 500 MG capsule  Commonly known as:  KEFLEX  Take 1 capsule (500 mg total) by mouth every 8 (eight) hours.   Indication:  Urinary Tract Infection     neomycin-bacitracin-polymyxin Oint  Commonly known as:  NEOSPORIN  Apply 1 application topically as needed for wound care.   Indication:  infection     OLANZapine zydis 10 MG disintegrating tablet  Commonly known as:  ZYPREXA  Take 1 tablet (10 mg total) by mouth at bedtime.   Indication:  mood stabilization / psychosis       Follow-up Information    Follow up with [facility].   Why:  Resume your regular services with them when you d/c from the hospital   Contact information:   [address]  [place] [[phone]] [phone]      Follow-up recommendations:  Activity:  As tolerated Diet:  Heart healthy with low sodium.  Comments:   Take all medications as prescribed. Keep all follow-up appointments as scheduled.  Do not consume alcohol or use illegal drugs while on  prescription medications. Report any adverse effects from your medications to your primary care provider promptly.  In the event of recurrent symptoms or worsening symptoms, call 911, a crisis hotline, or go to the nearest emergency department for evaluation.   Total Discharge Time: 40 min  Signed: Beau FannyWithrow, John C, FNP-BC 11/24/2015, 12:00 PM  I personally assessed the patient and formulated the plan Madie RenoIrving A. Dub MikesLugo, M.D.

## 2016-02-05 ENCOUNTER — Encounter (HOSPITAL_COMMUNITY): Payer: Self-pay | Admitting: Advanced Practice Midwife

## 2016-02-05 ENCOUNTER — Inpatient Hospital Stay (HOSPITAL_COMMUNITY)
Admission: AD | Admit: 2016-02-05 | Discharge: 2016-02-05 | Disposition: A | Discharge status: Home / Self Care | Payer: Medicaid Other | Source: Ambulatory Visit | Attending: Obstetrics and Gynecology | Admitting: Obstetrics and Gynecology

## 2016-02-05 DIAGNOSIS — F209 Schizophrenia, unspecified: Secondary | ICD-10-CM | POA: Insufficient documentation

## 2016-02-05 DIAGNOSIS — Z87891 Personal history of nicotine dependence: Secondary | ICD-10-CM | POA: Insufficient documentation

## 2016-02-05 DIAGNOSIS — A499 Bacterial infection, unspecified: Secondary | ICD-10-CM | POA: Diagnosis not present

## 2016-02-05 DIAGNOSIS — O4702 False labor before 37 completed weeks of gestation, second trimester: Secondary | ICD-10-CM | POA: Diagnosis not present

## 2016-02-05 DIAGNOSIS — Z3A26 26 weeks gestation of pregnancy: Secondary | ICD-10-CM | POA: Insufficient documentation

## 2016-02-05 DIAGNOSIS — O99342 Other mental disorders complicating pregnancy, second trimester: Secondary | ICD-10-CM | POA: Diagnosis not present

## 2016-02-05 DIAGNOSIS — N760 Acute vaginitis: Secondary | ICD-10-CM

## 2016-02-05 DIAGNOSIS — O23592 Infection of other part of genital tract in pregnancy, second trimester: Secondary | ICD-10-CM

## 2016-02-05 DIAGNOSIS — B9689 Other specified bacterial agents as the cause of diseases classified elsewhere: Secondary | ICD-10-CM

## 2016-02-05 DIAGNOSIS — F1490 Cocaine use, unspecified, uncomplicated: Secondary | ICD-10-CM

## 2016-02-05 DIAGNOSIS — F1290 Cannabis use, unspecified, uncomplicated: Secondary | ICD-10-CM

## 2016-02-05 DIAGNOSIS — O99322 Drug use complicating pregnancy, second trimester: Secondary | ICD-10-CM

## 2016-02-05 LAB — WET PREP, GENITAL
Sperm: NONE SEEN
TRICH WET PREP: NONE SEEN
Yeast Wet Prep HPF POC: NONE SEEN

## 2016-02-05 LAB — URINE MICROSCOPIC-ADD ON

## 2016-02-05 LAB — RAPID URINE DRUG SCREEN, HOSP PERFORMED
AMPHETAMINES: NOT DETECTED
BENZODIAZEPINES: NOT DETECTED
Barbiturates: NOT DETECTED
Cocaine: POSITIVE — AB
OPIATES: NOT DETECTED
TETRAHYDROCANNABINOL: POSITIVE — AB

## 2016-02-05 LAB — URINALYSIS, ROUTINE W REFLEX MICROSCOPIC
GLUCOSE, UA: NEGATIVE mg/dL
KETONES UR: 40 mg/dL — AB
NITRITE: NEGATIVE
PH: 7.5 (ref 5.0–8.0)
PROTEIN: 100 mg/dL — AB
Specific Gravity, Urine: 1.015 (ref 1.005–1.030)

## 2016-02-05 LAB — FETAL FIBRONECTIN: FETAL FIBRONECTIN: NEGATIVE

## 2016-02-05 MED ORDER — METRONIDAZOLE 500 MG PO TABS: 500.0000 mg | ORAL_TABLET | Freq: Two times a day (BID) | ORAL | Status: DC

## 2016-02-05 NOTE — MAU Note (Signed)
Pt states after pelvic exam that she would like to go home. CNM and I went to print discharge instructions and patient walked out of the unit.

## 2016-02-05 NOTE — MAU Provider Note (Signed)
Chief Complaint:  Contractions   First Provider Initiated Contact with Patient 02/05/16 1928     HPI: Makayla Medina is a 30 y.o. G4P1021 at [redacted]w[redacted]d who presents to maternity admissions reporting contractions today. They had been severe, but have improved spontaneously. Has not started prenatal care due to not having Medicaid.   Location: low abd Quality: cramping Severity: 1/10 in pain scale Duration: few hours Context: None Timing: intermittent Modifying factors: Hasn't tried anything for the pain. Improved spontaneously  Associated signs and symptoms: Neg for fever, chills, VB, LOF ,vaginal discharge, urinary complaints, GI complaints.   Good fetal movement.   Admits to cocaine, MJ and ETOH use today.   Past Medical History: Past Medical History  Diagnosis Date  . Anxiety   . Bipolar disorder (HCC)   . Vaginal Pap smear, abnormal   . Gonorrhea   . Chlamydia   . Schizophrenia (HCC)     Past obstetric history: OB History  Gravida Para Term Preterm AB SAB TAB Ectopic Multiple Living  # Outcome Date GA Lbr Len/2nd Weight Sex Delivery Anes PTL Lv  4 Current           3 TAB           2 TAB           1 Term               Past Surgical History: Past Surgical History  Procedure Laterality Date  . Dilation and curettage of uterus    . Colposcopy       Family History: Family History  Problem Relation Age of Onset  . Alcoholism Mother     Social History: Social History  Substance Use Topics  . Smoking status: Former Smoker -- 0.25 packs/day    Types: Cigarettes  . Smokeless tobacco: Not on file  . Alcohol Use: No     Comment: socially    Allergies: No Known Allergies  Meds:  No prescriptions prior to admission    I have reviewed patient's Past Medical Hx, Surgical Hx, Family Hx, Social Hx, medications and allergies.   ROS:  Review of Systems  Constitutional: Negative for fever and chills.  Gastrointestinal: Positive for abdominal  pain. Negative for nausea, vomiting, diarrhea and constipation.  Genitourinary: Negative for dysuria, hematuria, vaginal bleeding and vaginal discharge.    Physical Exam   Patient Vitals for the past 24 hrs:  BP Temp Temp src Pulse Resp Weight  02/05/16 1858 118/72 mmHg 97.6 F (36.4 C) Oral 111 18 167 lb 3.2 oz (75.841 kg)   Constitutional: Well-developed, well-nourished female in no acute distress. Flat affect. Brief answers to questions.  Cardiovascular: tachycardia Respiratory: normal effort GI: Abd soft, non-tender, gravid appropriate for gestational age.  MS: Extremities nontender, no edema, normal ROM Neurologic: Alert and oriented x 4.  GU: Neg CVAT.  Pelvic: NEFG, physiologic discharge, no blood, cervix clean. No CMT. Cervix displaced to maternal right.   Dilation: Closed Effacement (%): Thick Cervical Position: Middle Station: Ballotable Presentation: Undeterminable Exam by:: Ivonne Andrew CNM  FHT:  Baseline 145 , moderate variability, 15x15 accelerations present, no decelerations Contractions: None traced or palpated   Labs: Results for orders placed or performed during the hospital encounter of 02/05/16 (from the past 24 hour(s))  Urinalysis, Routine w reflex microscopic (not at [facility])     Status: Abnormal   Collection Time: 02/05/16  6:49 PM  Result  Value Ref Range   Color, Urine YELLOW YELLOW   APPearance HAZY (A) CLEAR   Specific Gravity, Urine 1.015 1.005 - 1.030   pH 7.5 5.0 - 8.0   Glucose, UA NEGATIVE NEGATIVE mg/dL   Hgb urine dipstick TRACE (A) NEGATIVE   Bilirubin Urine MODERATE (A) NEGATIVE   Ketones, ur 40 (A) NEGATIVE mg/dL   Protein, ur 130100 (A) NEGATIVE mg/dL   Nitrite NEGATIVE NEGATIVE   Leukocytes, UA MODERATE (A) NEGATIVE  Urine microscopic-add on     Status: Abnormal   Collection Time: 02/05/16  6:49 PM  Result Value Ref Range   Squamous Epithelial / LPF 0-5 (A) NONE SEEN   WBC, UA 0-5 0 - 5 WBC/hpf   RBC / HPF 0-5 0 - 5 RBC/hpf   Bacteria,  UA FEW (A) NONE SEEN    Imaging:  No results found.  MAU Course: Spec exam, UA, fFN, Wet prep, GC/Chlamydia cultures.   Pt left before result were back.   MDM: - 30 year old female at 7126.[redacted] weeks gestation w/ report of preterm contractions that resolved spontaneously. No evidence of active preterm labor.  - Admits to drug use. Left w/out having this issue addressed.  - No Prenatal care. Pt informed that she can start in clinic even if Medicaid not active.   Assessment: 1. Preterm contractions, second trimester     Plan: Discharge home in stable condition.  Preterm labor precautions and fetal kick counts Offered to schedule appt in WOC, but pt left w/out D/C papers indicating appt time and w/out Flagyl Rx. Sent to pharmacy.  Will send in-basket message to WOC to schedule NOB w/ contact info given by pt.  Follow-up Information    [facility] On 02/09/2016.   Specialty:  Obstetrics and Gynecology   Why:  Start prenatal care Monday 02/09/16 at 1:00 pm   Contact information:   [address] [place] [id number] [phone]      Follow up with [facility].   Why:  As needed in emergencies   Contact information:   [address] [id number] [address] [id number] [phone]        Medication List    TAKE these medications        cephALEXin 500 MG capsule  Commonly known as:  KEFLEX  Take 1 capsule (500 mg total) by mouth every 8 (eight) hours.     neomycin-bacitracin-polymyxin Oint  Commonly known as:  NEOSPORIN  Apply 1 application topically as needed for wound care.     OLANZapine zydis 10 MG disintegrating tablet  Commonly known as:  ZYPREXA  Take 1 tablet (10 mg total) by mouth at bedtime.        [place]ia Kamillah Didonato, [place] 02/05/2016 7:52 PM

## 2016-02-05 NOTE — MAU Note (Signed)
Pt reports she has been having contraction today. Reports she say some blood when she went ot the bathroom and wiped.

## 2016-02-05 NOTE — Discharge Instructions (Signed)
Preterm Labor Information Preterm labor is when labor starts at less than 37 weeks of pregnancy. The normal length of a pregnancy is 39 to 41 weeks. CAUSES Often, there is no identifiable underlying cause as to why a woman goes into preterm labor. One of the most common known causes of preterm labor is infection. Infections of the uterus, cervix, vagina, amniotic sac, bladder, kidney, or even the lungs (pneumonia) can cause labor to start. Other suspected causes of preterm labor include:   Urogenital infections, such as yeast infections and bacterial vaginosis.   Uterine abnormalities (uterine shape, uterine septum, fibroids, or bleeding from the placenta).   A cervix that has been operated on (it may fail to stay closed).   Malformations in the fetus.   Multiple gestations (twins, triplets, and so on).   Breakage of the amniotic sac.  RISK FACTORS  Having a previous history of preterm labor.   Having premature rupture of membranes (PROM).   Having a placenta that covers the opening of the cervix (placenta previa).   Having a placenta that separates from the uterus (placental abruption).   Having a cervix that is too weak to hold the fetus in the uterus (incompetent cervix).   Having too much fluid in the amniotic sac (polyhydramnios).   Taking illegal drugs or smoking while pregnant.   Not gaining enough weight while pregnant.   Being younger than 4718 and older than 29 years old.   Having a low socioeconomic status.   Being African American. SYMPTOMS Signs and symptoms of preterm labor include:   Menstrual-like cramps, abdominal pain, or back pain.  Uterine contractions that are regular, as frequent as six in an hour, regardless of their intensity (may be mild or painful).  Contractions that start on the top of the uterus and spread down to the lower abdomen and back.   A sense of increased pelvic pressure.   A watery or bloody mucus discharge that  comes from the vagina.  TREATMENT Depending on the length of the pregnancy and other circumstances, your health care provider may suggest bed rest. If necessary, there are medicines that can be given to stop contractions and to mature the fetal lungs. If labor happens before 34 weeks of pregnancy, a prolonged hospital stay may be recommended. Treatment depends on the condition of both you and the fetus.  WHAT SHOULD YOU DO IF YOU THINK YOU ARE IN PRETERM LABOR? Call your health care provider right away. You will need to go to the hospital to get checked immediately. HOW CAN YOU PREVENT PRETERM LABOR IN FUTURE PREGNANCIES? You should:   Stop smoking if you smoke.  Maintain healthy weight gain and avoid chemicals and drugs that are not necessary.  Be watchful for any type of infection.  Inform your health care provider if you have a known history of preterm labor.   This information is not intended to replace advice given to you by your health care provider. Make sure you discuss any questions you have with your health care provider.   Document Released: 01/29/2004 Document Revised: 07/11/2013 Document Reviewed: 12/11/2012 Elsevier Interactive Patient Education 2016 [employer]  Second Trimester of Pregnancy The second trimester is from week 13 through week 28, months 4 through 6. The second trimester is often a time when you feel your best. Your body has also adjusted to being pregnant, and you begin to feel better physically. Usually, morning sickness has lessened or quit completely, you may have more energy, and  you may have an increase in appetite. The second trimester is also a time when the fetus is growing rapidly. At the end of the sixth month, the fetus is about 9 inches long and weighs about 1 pounds. You will likely begin to feel the baby move (quickening) between 18 and 20 weeks of the pregnancy. BODY CHANGES Your body goes through many changes during pregnancy. The changes  vary from woman to woman.   Your weight will continue to increase. You will notice your lower abdomen bulging out.  You may begin to get stretch marks on your hips, abdomen, and breasts.  You may develop headaches that can be relieved by medicines approved by your health care provider.  You may urinate more often because the fetus is pressing on your bladder.  You may develop or continue to have heartburn as a result of your pregnancy.  You may develop constipation because certain hormones are causing the muscles that push waste through your intestines to slow down.  You may develop hemorrhoids or swollen, bulging veins (varicose veins).  You may have back pain because of the weight gain and pregnancy hormones relaxing your joints between the bones in your pelvis and as a result of a shift in weight and the muscles that support your balance.  Your breasts will continue to grow and be tender.  Your gums may bleed and may be sensitive to brushing and flossing.  Dark spots or blotches (chloasma, mask of pregnancy) may develop on your face. This will likely fade after the baby is born.  A dark line from your belly button to the pubic area (linea nigra) may appear. This will likely fade after the baby is born.  You may have changes in your hair. These can include thickening of your hair, rapid growth, and changes in texture. Some women also have hair loss during or after pregnancy, or hair that feels dry or thin. Your hair will most likely return to normal after your baby is born. WHAT TO EXPECT AT YOUR PRENATAL VISITS During a routine prenatal visit:  You will be weighed to make sure you and the fetus are growing normally.  Your blood pressure will be taken.  Your abdomen will be measured to track your baby's growth.  The fetal heartbeat will be listened to.  Any test results from the previous visit will be discussed. Your health care provider may ask you:  How you are  feeling.  If you are feeling the baby move.  If you have had any abnormal symptoms, such as leaking fluid, bleeding, severe headaches, or abdominal cramping.  If you are using any tobacco products, including cigarettes, chewing tobacco, and electronic cigarettes.  If you have any questions. Other tests that may be performed during your second trimester include:  Blood tests that check for:  Low iron levels (anemia).  Gestational diabetes (between 24 and 28 weeks).  Rh antibodies.  Urine tests to check for infections, diabetes, or protein in the urine.  An ultrasound to confirm the proper growth and development of the baby.  An amniocentesis to check for possible genetic problems.  Fetal screens for spina bifida and Down syndrome.  HIV (human immunodeficiency virus) testing. Routine prenatal testing includes screening for HIV, unless you choose not to have this test. HOME CARE INSTRUCTIONS   Avoid all smoking, herbs, alcohol, and unprescribed drugs. These chemicals affect the formation and growth of the baby.  Do not use any tobacco products, including cigarettes, chewing tobacco,  and electronic cigarettes. If you need help quitting, ask your health care provider. You may receive counseling support and other resources to help you quit.  Follow your health care provider's instructions regarding medicine use. There are medicines that are either safe or unsafe to take during pregnancy.  Exercise only as directed by your health care provider. Experiencing uterine cramps is a good sign to stop exercising.  Continue to eat regular, healthy meals.  Wear a good support bra for breast tenderness.  Do not use hot tubs, steam rooms, or saunas.  Wear your seat belt at all times when driving.  Avoid raw meat, uncooked cheese, cat litter boxes, and soil used by cats. These carry germs that can cause birth defects in the baby.  Take your prenatal vitamins.  Take 1500-2000 mg of  calcium daily starting at the 20th week of pregnancy until you deliver your baby.  Try taking a stool softener (if your health care provider approves) if you develop constipation. Eat more high-fiber foods, such as fresh vegetables or fruit and whole grains. Drink plenty of fluids to keep your urine clear or pale yellow.  Take warm sitz baths to soothe any pain or discomfort caused by hemorrhoids. Use hemorrhoid cream if your health care provider approves.  If you develop varicose veins, wear support hose. Elevate your feet for 15 minutes, 3-4 times a day. Limit salt in your diet.  Avoid heavy lifting, wear low heel shoes, and practice good posture.  Rest with your legs elevated if you have leg cramps or low back pain.  Visit your dentist if you have not gone yet during your pregnancy. Use a soft toothbrush to brush your teeth and be gentle when you floss.  A sexual relationship may be continued unless your health care provider directs you otherwise.  Continue to go to all your prenatal visits as directed by your health care provider. SEEK MEDICAL CARE IF:   You have dizziness.  You have mild pelvic cramps, pelvic pressure, or nagging pain in the abdominal area.  You have persistent nausea, vomiting, or diarrhea.  You have a bad smelling vaginal discharge.  You have pain with urination. SEEK IMMEDIATE MEDICAL CARE IF:   You have a fever.  You are leaking fluid from your vagina.  You have spotting or bleeding from your vagina.  You have severe abdominal cramping or pain.  You have rapid weight gain or loss.  You have shortness of breath with chest pain.  You notice sudden or extreme swelling of your face, hands, ankles, feet, or legs.  You have not felt your baby move in over an hour.  You have severe headaches that do not go away with medicine.  You have vision changes.   This information is not intended to replace advice given to you by your health care provider.  Make sure you discuss any questions you have with your health care provider.   Document Released: 11/02/2001 Document Revised: 11/29/2014 Document Reviewed: 01/09/2013 Elsevier Interactive Patient Education [employer]

## 2016-02-06 LAB — GC/CHLAMYDIA PROBE AMP (~~LOC~~) NOT AT ARMC
Chlamydia: NEGATIVE
Neisseria Gonorrhea: NEGATIVE

## 2016-02-07 ENCOUNTER — Inpatient Hospital Stay (HOSPITAL_COMMUNITY)
Admission: AD | Admit: 2016-02-07 | Discharge: 2016-02-13 | Disposition: A | Discharge status: Home / Self Care | Payer: Medicaid Other | Source: Ambulatory Visit | Attending: Physician Assistant | Admitting: Physician Assistant

## 2016-02-07 DIAGNOSIS — Z87891 Personal history of nicotine dependence: Secondary | ICD-10-CM | POA: Insufficient documentation

## 2016-02-07 DIAGNOSIS — O99342 Other mental disorders complicating pregnancy, second trimester: Secondary | ICD-10-CM | POA: Insufficient documentation

## 2016-02-07 DIAGNOSIS — O4702 False labor before 37 completed weeks of gestation, second trimester: Secondary | ICD-10-CM

## 2016-02-07 DIAGNOSIS — F1220 Cannabis dependence, uncomplicated: Secondary | ICD-10-CM

## 2016-02-07 DIAGNOSIS — F1290 Cannabis use, unspecified, uncomplicated: Secondary | ICD-10-CM | POA: Insufficient documentation

## 2016-02-07 DIAGNOSIS — Z3A27 27 weeks gestation of pregnancy: Secondary | ICD-10-CM | POA: Insufficient documentation

## 2016-02-07 DIAGNOSIS — F250 Schizoaffective disorder, bipolar type: Secondary | ICD-10-CM | POA: Diagnosis not present

## 2016-02-07 DIAGNOSIS — O99322 Drug use complicating pregnancy, second trimester: Secondary | ICD-10-CM | POA: Diagnosis not present

## 2016-02-07 DIAGNOSIS — F1420 Cocaine dependence, uncomplicated: Secondary | ICD-10-CM | POA: Insufficient documentation

## 2016-02-07 DIAGNOSIS — F419 Anxiety disorder, unspecified: Secondary | ICD-10-CM | POA: Diagnosis not present

## 2016-02-07 LAB — CBC
HCT: 31.2 % — ABNORMAL LOW (ref 36.0–46.0)
Hemoglobin: 11.1 g/dL — ABNORMAL LOW (ref 12.0–15.0)
MCH: 30.5 pg (ref 26.0–34.0)
MCHC: 35.6 g/dL (ref 30.0–36.0)
MCV: 85.7 fL (ref 78.0–100.0)
Platelets: 153 10*3/uL (ref 150–400)
RBC: 3.64 MIL/uL — ABNORMAL LOW (ref 3.87–5.11)
RDW: 12.6 % (ref 11.5–15.5)
WBC: 8.3 10*3/uL (ref 4.0–10.5)

## 2016-02-07 LAB — URINALYSIS, ROUTINE W REFLEX MICROSCOPIC
GLUCOSE, UA: NEGATIVE mg/dL
Ketones, ur: NEGATIVE mg/dL
NITRITE: NEGATIVE
PH: 7.5 (ref 5.0–8.0)
Protein, ur: NEGATIVE mg/dL
SPECIFIC GRAVITY, URINE: 1.02 (ref 1.005–1.030)

## 2016-02-07 LAB — I-STAT CHEM 8, ED
BUN: <3 mg/dL — ABNORMAL LOW (ref 6–20)
Calcium, Ion: 1.14 mmol/L (ref 1.12–1.23)
Chloride: 103 mmol/L (ref 101–111)
Creatinine, Ser: 0.5 mg/dL (ref 0.44–1.00)
Glucose, Bld: 96 mg/dL (ref 65–99)
HEMATOCRIT: 33 % — AB (ref 36.0–46.0)
HEMOGLOBIN: 11.2 g/dL — AB (ref 12.0–15.0)
POTASSIUM: 3 mmol/L — AB (ref 3.5–5.1)
SODIUM: 138 mmol/L (ref 135–145)
TCO2: 20 mmol/L (ref 0–100)

## 2016-02-07 LAB — URINE MICROSCOPIC-ADD ON

## 2016-02-07 LAB — RAPID URINE DRUG SCREEN, HOSP PERFORMED
Amphetamines: NOT DETECTED
Barbiturates: NOT DETECTED
Benzodiazepines: NOT DETECTED
Cocaine: POSITIVE — AB
Opiates: NOT DETECTED
Tetrahydrocannabinol: POSITIVE — AB

## 2016-02-07 LAB — ETHANOL

## 2016-02-07 MED ORDER — ZIPRASIDONE MESYLATE 20 MG IM SOLR: 20.0000 mg | Freq: Once | INTRAMUSCULAR | Status: DC

## 2016-02-07 MED ORDER — HALOPERIDOL LACTATE 5 MG/ML IJ SOLN
5.0000 mg | Freq: Four times a day (QID) | INTRAMUSCULAR | Status: DC | PRN
  Administered 2016-02-07: 5 mg via INTRAMUSCULAR
  Filled 2016-02-07: qty 1

## 2016-02-07 MED ORDER — ACETAMINOPHEN 325 MG PO TABS
650.0000 mg | ORAL_TABLET | ORAL | Status: DC | PRN
  Administered 2016-02-08: 325 mg via ORAL
  Filled 2016-02-07 (×2): qty 2

## 2016-02-07 MED ORDER — POTASSIUM CHLORIDE CRYS ER 20 MEQ PO TBCR
40.0000 meq | EXTENDED_RELEASE_TABLET | Freq: Once | ORAL | Status: AC
  Administered 2016-02-07: 40 meq via ORAL
  Filled 2016-02-07: qty 2

## 2016-02-07 NOTE — ED Notes (Signed)
On the phone 

## 2016-02-07 NOTE — ED Notes (Signed)
At this time she is taken to another bed, where Verlon AuLeslie, my colleague has assumed her care.  Pt. Is drowsy and in no distress.

## 2016-02-07 NOTE — ED Notes (Signed)
Patient noted in room. No complaints, stable, in no acute distress. Q15 minute rounds and monitoring via Security Cameras to continue.  

## 2016-02-07 NOTE — Progress Notes (Signed)
Police called because patient becoming combative and swinging at security guard.  Pt went back into room with security guard remaining at door.

## 2016-02-07 NOTE — Progress Notes (Signed)
Patient transported voluntarily to [facility] via [place] [employer].

## 2016-02-07 NOTE — Progress Notes (Signed)
Dr Ashok PallWouk in to evaluate patient.  Patient refusing to answer questions.  Patient singing one minute, then combative, then crying.  Security guard standing outside of room with door open.

## 2016-02-07 NOTE — BH Assessment (Addendum)
Tele Assessment Note   Makayla Medina is an 30 y.o. female [facility] due to exhibiting bizarre behaviors. Pt stated "I am stressed out". Pt reported that she is on bed rest.  Pt denies SI and HI at this time. When pt was asked about hallucinations pt stated "I have schizophrenia, bipolar and PTSD you tell me". Pt appears to be delusional and believes that others are poisoning her food. While lying on the bed pt stated "I am scared I am about to fall off". Pt's UDS is positive for cocaine and THC. Pt did not report any mental health treatment at this time but stated "my mental health court counselor is going to get me one". PT has had multiple psychiatric hospitalizations.  Pt is [redacted] weeks pregnant.   Inpatient treatment is recommended for mood stabilization.   Diagnosis: Schizoaffective disorder, bipolar type   Past Medical History:  Past Medical History  Diagnosis Date  . Anxiety   . Bipolar disorder (HCC)   . Vaginal Pap smear, abnormal   . Gonorrhea   . Chlamydia   . Schizophrenia [facility](HCC)     Past Surgical History  Procedure Laterality Date  . Dilation and curettage of uterus    . Colposcopy      Family History:  Family History  Problem Relation Age of Onset  . Alcoholism Mother     Social History:  reports that she has quit smoking. Her smoking use included Cigarettes. She smoked 0.25 packs per day. She does not have any smokeless tobacco history on file. She reports that she uses illicit drugs (Marijuana and Cocaine). She reports that she does not drink alcohol.  Additional Social History:  Alcohol / Drug Use History of alcohol / drug use?: Yes (Unable to assess; however UDS is positive for cocaine and THC. )  CIWA: CIWA-Ar BP: 108/69 mmHg Pulse Rate: 109 COWS:    PATIENT STRENGTHS: (choose at least two) Average or above average intelligence Communication skills  Allergies: No Known Allergies  Home Medications:  (Not in a hospital  admission)  OB/GYN Status:  No LMP recorded (lmp unknown). Patient is pregnant.  General Assessment Data Location of Assessment: [facility] ED TTS Assessment: In system Is this a Tele or Face-to-Face Assessment?: Face-to-Face Is this an Initial Assessment or a Re-assessment for this encounter?: Initial Assessment Living Arrangements: Parent Can pt return to current living arrangement?: Yes Admission Status: Involuntary Is patient capable of signing voluntary admission?: Yes Referral Source: Self/Family/Friend Insurance type: None      Crisis Care Plan Living Arrangements: Parent Name of Psychiatrist: No provider reported  Name of Therapist: No provider reported.   Education Status Is patient currently in school?: No  Risk to self with the past 6 months Suicidal Ideation: No Has patient been a risk to self within the past 6 months prior to admission? : Yes Suicidal Intent: No Has patient had any suicidal intent within the past 6 months prior to admission? : Yes Is patient at risk for suicide?: No Suicidal Plan?: No Has patient had any suicidal plan within the past 6 months prior to admission? : Yes Access to Means: No What has been your use of drugs/alcohol within the last 12 months?: UDS is positive for THC and cocaine  Previous Attempts/Gestures: Yes How many times?: 1 Other Self Harm Risks: Pt denies  Triggers for Past Attempts: None known Intentional Self Injurious Behavior: None Family Suicide History: Unknown Recent stressful life event(s): Other (Comment) ("I'm on  bedrest" ) Persecutory voices/beliefs?: No Depression:  (Unable to assess) Depression Symptoms:  (Unable to assess) Substance abuse history and/or treatment for substance abuse?: Yes Suicide prevention information given to non-admitted patients: Not applicable  Risk to Others within the past 6 months Homicidal Ideation: No Does patient have any lifetime risk of violence toward others beyond the six months  prior to admission? : No Thoughts of Harm to Others: No Current Homicidal Intent: No Current Homicidal Plan: No Access to Homicidal Means: No Identified Victim: N/A History of harm to others?: No Assessment of Violence: None Noted Violent Behavior Description: None noted.  Does patient have access to weapons?: No Criminal Charges Pending?: Yes Describe Pending Criminal Charges: Communicating threats, injury to personal property, injury to real property, disorderly contact.  Does patient have a court date: Yes Court Date: 02/12/16 (02/19/16) Is patient on probation?: Unknown  Psychosis Hallucinations:  (unable to assess ) Delusions: Unspecified  Mental Status Report Appearance/Hygiene: In scrubs Eye Contact: Poor Motor Activity: Unable to assess Speech: Soft, Logical/coherent Level of Consciousness: Drowsy Mood: Euthymic Affect: Blunted Anxiety Level: Minimal Thought Processes: Coherent, Relevant Judgement: Partial Orientation: Appropriate for developmental age Obsessive Compulsive Thoughts/Behaviors: None  Cognitive Functioning Concentration: Decreased Memory: Recent Intact, Remote Intact IQ: Average Insight: Poor Impulse Control: Poor Appetite:  (unable to assess) Weight Loss:  (unable to assess) Weight Gain:  (unable to assess ) Sleep: Unable to Assess Vegetative Symptoms: Unable to Assess  ADLScreening [facility] Assessment Services) Patient's cognitive ability adequate to safely complete daily activities?: Yes Patient able to express need for assistance with ADLs?: Yes Independently performs ADLs?: Yes (appropriate for developmental age)  Prior Inpatient Therapy Prior Inpatient Therapy: Yes Prior Therapy Dates: 2005, 2012, 2015, 2016 Prior Therapy Facilty/Provider(s): [facility]  Reason for Treatment: Psychosis  Prior Outpatient Therapy Prior Outpatient Therapy:  (unable to assess) Does patient have an ACCT team?: Unknown Does patient have Intensive In-House  Services?  : No Does patient have [facility] services? : Unknown Does patient have P4CC services?: Unknown  ADL Screening (condition at time of admission) Patient's cognitive ability adequate to safely complete daily activities?: Yes Is the patient deaf or have difficulty hearing?: No Does the patient have difficulty seeing, even when wearing glasses/contacts?: No Does the patient have difficulty concentrating, remembering, or making decisions?: No Patient able to express need for assistance with ADLs?: Yes Does the patient have difficulty dressing or bathing?: No Independently performs ADLs?: Yes (appropriate for developmental age)       Abuse/Neglect Assessment (Assessment to be complete while patient is alone) Physical Abuse:  (Unable to assess ) Verbal Abuse:  (unable to assess) Sexual Abuse:  (unable to assess ) Exploitation of patient/patient's resources:  (unable to assess ) Self-Neglect:  (unable to assess )          Additional Information 1:1 In Past 12 Months?: No CIRT Risk: No Elopement Risk: No Does patient have medical clearance?: Yes     Disposition:  Disposition Initial Assessment Completed for this Encounter: Yes Disposition of Patient: Inpatient treatment program Type of inpatient treatment program: Adult  Aayush Gelpi S 02/07/2016 10:10 PM

## 2016-02-07 NOTE — ED Notes (Signed)
Pt up the desk, crackers/cheese given,  Pt ambulatory back to room w/o difficulty and kept falling asleep during conversation.

## 2016-02-07 NOTE — MAU Provider Note (Signed)
History   CSN: [id number]  Arrival date and time: 02/07/16 1159   First Provider Initiated Contact with Patient 02/07/16 1224      Chief Complaint  Patient presents with  . Abdominal Pain   HPI Patient is 30 y.o. [id number] here with complaints of abdominal pain. States she has been having contractions for the last 24 hours. Patient unable to tell me how often she is having contractions as she was unaware that she was supposed to time them. However not occuring frequently. No pain currently.  Denies LOF, VB, +FM.  Of note, [facility]. Was not evaluated there as she was sent immediately here for evaluation of abdominal pain.   Patient denies SI/HI. However states she is talking to her "god" and she can say whatever. Mentioned that she doesn't want to kill herself during this conversation.    OB History    Gravida Para Term Preterm AB TAB SAB Ectopic Multiple Living   4 1 1  2 2    1     HRC but has not had prenatal appointment yet  Past Medical History  Diagnosis Date  . Anxiety   . Bipolar disorder (HCC)   . Vaginal Pap smear, abnormal   . Gonorrhea   . Chlamydia   . Schizophrenia [facility](HCC)     Past Surgical History  Procedure Laterality Date  . Dilation and curettage of uterus    . Colposcopy      Family History  Problem Relation Age of Onset  . Alcoholism Mother     Social History  Substance Use Topics  . Smoking status: Former Smoker -- 0.25 packs/day    Types: Cigarettes  . Smokeless tobacco: Not on file  . Alcohol Use: No     Comment: socially    Allergies: No Known Allergies  Prescriptions prior to admission  Medication Sig Dispense Refill Last Dose  . cephALEXin (KEFLEX) 500 MG capsule Take 1 capsule (500 mg total) by mouth every 8 (eight) hours. 5 capsule 0   . metroNIDAZOLE (FLAGYL) 500 MG tablet Take 1 tablet (500 mg total) by mouth 2 (two) times daily. 14 tablet 0   . neomycin-bacitracin-polymyxin  (NEOSPORIN) OINT Apply 1 application topically as needed for wound care. 1 Tube 0   . OLANZapine zydis (ZYPREXA) 10 MG disintegrating tablet Take 1 tablet (10 mg total) by mouth at bedtime. 30 tablet 0     Review of Systems  Gastrointestinal: Positive for abdominal pain.  Unable to obtain fully due to patient compliance Also review HPI  Physical Exam   Blood pressure 112/70, pulse 96, temperature 97.7 F (36.5 C), resp. rate 18, currently breastfeeding.  Physical Exam  Constitutional: She appears well-developed and well-nourished.  HENT:  Head: Normocephalic and atraumatic.  Eyes: Conjunctivae are normal.  Cardiovascular: Normal rate.   Respiratory: Effort normal.  GI: Soft. There is no tenderness.  Psychiatric: Her affect is angry, labile and inappropriate. Her speech is tangential. She is agitated.   Dilation: Closed Effacement (%): Thick Exam by:: Dr.Phelps   MAU Course  Procedures - None  MDM Doppler FHR 145 SVE closed/thick/high No contractions palpated  Assessment and Plan  A: Patient is 30 y.o. [id number] reporting abdominal pain likely secondary to Braxton-Hicks contractions. No sign of active preterm labor. She was unwilling to have monitors placed for continuous fetal monitoring but doppler shows normal fetal heart rate. ffn negative 2 days ago, here cervix closed/thick/high.  P: Transfer back  to [facility] for behavioral health evaluation. Needs prenatal follow-up (was scheduled at last MAU visit 2 days ago).    Caryl Ada, DO 02/07/2016, 12:25 PM PGY-2, [facility]  OB FELLOW MAU DISCHARGE ATTESTATION  I have seen and examined this patient; I agree with above documentation in the resident's note.    Silvano Bilis, MD 1:05 PM

## 2016-02-07 NOTE — BH Assessment (Signed)
Assessment completed. Consulted Alberteen SamFran Hobson, NP who recommended inpatient treatment. TTS to seek placement.

## 2016-02-07 NOTE — ED Notes (Signed)
Opt's father into see

## 2016-02-07 NOTE — ED Notes (Signed)
Patient and belongings are wanded by security and 2 pb bag sare [facility] [employer]

## 2016-02-07 NOTE — ED Notes (Addendum)
Pt up on hall tearful about her father having to leave repeating "bang bang chitti chitti bang bang.." sts that she called the police to get help. Support given, pt redirectable.

## 2016-02-07 NOTE — ED Notes (Signed)
Up on the phone 

## 2016-02-07 NOTE — ED Notes (Signed)
Gave patient lunch tray,refused to eat-believes it was poisoned.

## 2016-02-07 NOTE — ED Notes (Signed)
Numerous people interact with her at times, including police and my colleagues; and so far this is keeping her calm and occupied.  She remains in no distress.

## 2016-02-07 NOTE — ED Notes (Signed)
Report from Janie Rambo RN. Patient alert and oriented, warm and dry, in no acute distress. Patient denies SI, HI, AVH and pain. Patient made aware of Q15 minute rounds and security cameras for their safety. Patient encouraged to let me know if they are in need of assistance. 

## 2016-02-07 NOTE — ED Notes (Signed)
TTS into see 

## 2016-02-07 NOTE — BHH Counselor (Signed)
Patient given 5MG  of Haldol. Attempted to see patient. Patient was sedated. Called patients name and she told me her name and went back to sleep. Patient would not respond when name was called again.  Requested TTS consult be removed and put back in when patient is alert.   Makayla PokeJoVea Josefita Weissmann, LCSW Therapeutic Triage Specialist [facility] 02/07/2016 3:51 PM

## 2016-02-07 NOTE — ED Notes (Signed)
Bed: RESB Expected date:  Expected time:  Means of arrival:  Comments: Ts from MAU

## 2016-02-07 NOTE — ED Notes (Signed)
Pt ambulatory from triage to room, pt very sleepy on arrival and fell asleep.  Juice and sandwich privided.

## 2016-02-07 NOTE — ED Notes (Signed)
Pt agitated in room, yelling at security and police, will not allow ordered injection to be given, wanting staff searched before touching her, stating "That needle clean, I don't want no AIDS" began to scream and cry. Pt finally allowed injection to be given. Was provided a lunch tray, pt now mumbling to self, using hand jestures as if talking to someone in room with her.

## 2016-02-07 NOTE — ED Notes (Signed)
Patient noted sleeping in room. No complaints, stable, in no acute distress. Q15 minute rounds and monitoring via Security Cameras to continue.  

## 2016-02-07 NOTE — ED Notes (Signed)
She [facility] just [facility] acting very manic and somewhat paranoid in her demeanor.  She is accompanied by two [employer] Officers.  Our C.N., Alexia Freestoneatty has just give her I.M. Haldol.  She is very loud and profane, and is somewhat redirectable.  She is ambulatory and in no distress.

## 2016-02-07 NOTE — ED Notes (Signed)
Upon arrival, pt agitated, would not go into room, calling RN "Dirty Bitch" stating RN is in the "[employer]" putting hands in RN's face yelling at RN and calling RN "F**king Hoe" slang language used. Pt states "I worked [employer] for 8 months and I knows the secret"

## 2016-02-07 NOTE — MAU Note (Signed)
Pt presents to MAU via EMS for complaints of abdominal pain. Pt refuses to answer questions on why she is here. Was evaluated yesterday for the same issues. PT was taken to behavioral health and they sent her here to be evaluated first

## 2016-02-07 NOTE — ED Provider Notes (Signed)
CSN: [id number]     Arrival date & time 02/07/16  1159 History   First MD Initiated Contact with Patient 02/07/16 1322     Chief Complaint  Patient presents with  . Abdominal Pain     (Consider location/radiation/quality/duration/timing/severity/associated sxs/prior Treatment) HPI   Patient is 30 year old female who is [redacted] weeks pregnant transferred here from [facility]. Patient's been seen now twice in the last 2 days for abdominal pain and other complaints. [place] [facility] [employer] because she was acting out of control. Patient has history of bipolar schizophrenia has not been on any medications recently. Patient was very agitated on arrival showing obscenities and talking about the KGB and radius. Patient feels that someone might be poisoning her food as well.  Past Medical History  Diagnosis Date  . Anxiety   . Bipolar disorder (HCC)   . Vaginal Pap smear, abnormal   . Gonorrhea   . Chlamydia   . Schizophrenia [facility](HCC)    Past Surgical History  Procedure Laterality Date  . Dilation and curettage of uterus    . Colposcopy     Family History  Problem Relation Age of Onset  . Alcoholism Mother    Social History  Substance Use Topics  . Smoking status: Former Smoker -- 0.25 packs/day    Types: Cigarettes  . Smokeless tobacco: Not on file  . Alcohol Use: No     Comment: socially   OB History    Gravida Para Term Preterm AB TAB SAB Ectopic Multiple Living   4 1 1  2 2    1      Review of Systems    Allergies  Review of patient's allergies indicates no known allergies.  Home Medications   Prior to Admission medications   Medication Sig Start Date End Date Taking? Authorizing Provider  cephALEXin (KEFLEX) 500 MG capsule Take 1 capsule (500 mg total) by mouth every 8 (eight) hours. 11/24/15   Beau FannyJohn C Withrow, FNP  metroNIDAZOLE (FLAGYL) 500 MG tablet Take 1 tablet (500 mg total) by mouth 2 (two) times daily. 02/05/16   Dorathy KinsmanVirginia Smith,  CNM  neomycin-bacitracin-polymyxin (NEOSPORIN) OINT Apply 1 application topically as needed for wound care. 11/24/15   Beau FannyJohn C Withrow, FNP  OLANZapine zydis (ZYPREXA) 10 MG disintegrating tablet Take 1 tablet (10 mg total) by mouth at bedtime. 11/24/15   Everardo AllJohn C Withrow, FNP   BP 101/62 mmHg  Pulse 99  Temp(Src) 98.1 F (36.7 C) (Oral)  Resp 20  SpO2 100%  LMP  (LMP Unknown) Physical Exam  ED Course  Procedures (including critical care time) Labs Review Labs Reviewed  URINALYSIS, ROUTINE W REFLEX MICROSCOPIC (NOT AT [facility]) - Abnormal; Notable for the following:    Color, Urine AMBER (*)    Hgb urine dipstick TRACE (*)    Bilirubin Urine SMALL (*)    Leukocytes, UA TRACE (*)    All other components within normal limits  URINE MICROSCOPIC-ADD ON - Abnormal; Notable for the following:    Squamous Epithelial / LPF 6-30 (*)    Bacteria, UA RARE (*)    All other components within normal limits  CBC - Abnormal; Notable for the following:    RBC 3.64 (*)    Hemoglobin 11.1 (*)    HCT 31.2 (*)    All other components within normal limits  I-STAT CHEM 8, ED - Abnormal; Notable for the following:    Potassium 3.0 (*)    BUN <3 (*)  Hemoglobin 11.2 (*)    HCT 33.0 (*)    All other components within normal limits  ETHANOL  URINE RAPID DRUG SCREEN, HOSP PERFORMED    Imaging Review No results found. I have personally reviewed and evaluated these images and lab results as part of my medical decision-making.   EKG Interpretation None      MDM   Final diagnoses:  None    A shows a 30 year old female [redacted] weeks pregnant presenting here with psychosis. Patient talking about the KGB, radius, feel poisoning her food. Patient aggressive. Patient required sedation and multiple police officers to help calm patient down. Weight risk benefit for class C location and pregnancy. Patient was threatening both staff and herself as well as her unborn child.  Decision made to give medication to help  sedate her despite potential risks. Haldol helped immensely.  We'll consult TTS. Patient will likely require inpatient admission.     3:04 PM Will complete IVC paperwork  Courteney Randall An, MD 02/07/16 1504

## 2016-02-07 NOTE — ED Notes (Signed)
In the bathroom to shower  

## 2016-02-07 NOTE — Progress Notes (Signed)
Patient refused to sign transfer paperwork, refused VS prior to transfer.

## 2016-02-08 ENCOUNTER — Encounter (HOSPITAL_COMMUNITY): Payer: Self-pay | Admitting: Registered Nurse

## 2016-02-08 DIAGNOSIS — F250 Schizoaffective disorder, bipolar type: Secondary | ICD-10-CM | POA: Diagnosis not present

## 2016-02-08 MED ORDER — OLANZAPINE 10 MG PO TBDP: 10.0000 mg | ORAL_TABLET | Freq: Every day | ORAL | Status: DC

## 2016-02-08 MED ORDER — OLANZAPINE 5 MG PO TBDP
5.0000 mg | ORAL_TABLET | Freq: Two times a day (BID) | ORAL | Status: DC
  Administered 2016-02-08 – 2016-02-13 (×10): 5 mg via ORAL
  Filled 2016-02-08 (×10): qty 1

## 2016-02-08 MED ORDER — OLANZAPINE 10 MG PO TBDP
10.0000 mg | ORAL_TABLET | Freq: Three times a day (TID) | ORAL | Status: DC | PRN
  Administered 2016-02-09: 10 mg via ORAL
  Filled 2016-02-08 (×3): qty 1

## 2016-02-08 MED ORDER — PRENATAL MULTIVITAMIN CH
1.0000 | ORAL_TABLET | Freq: Every day | ORAL | Status: DC
  Administered 2016-02-08 – 2016-02-13 (×6): 1 via ORAL
  Filled 2016-02-08 (×6): qty 1

## 2016-02-08 MED ORDER — OLANZAPINE 10 MG PO TBDP
10.0000 mg | ORAL_TABLET | Freq: Three times a day (TID) | ORAL | Status: DC | PRN
  Filled 2016-02-08: qty 1

## 2016-02-08 NOTE — BH Assessment (Signed)
Writer faxed referral to the following facilities: [facility], [facility], [facility], [facility], [facility], [facility], [facility], [facility]

## 2016-02-08 NOTE — ED Notes (Signed)
[facility] to [facility] and did not bring the guardianship papers, requested that he bring  Them in tomorrow.

## 2016-02-08 NOTE — ED Notes (Signed)
Pt vomited after breakfast.  Pt denies nausea now and reports that she thinks it was from drinking OJ/milk/coffee.

## 2016-02-08 NOTE — ED Notes (Signed)
Pt laying down.

## 2016-02-08 NOTE — ED Notes (Signed)
Up tot he bathroom to shower and change scrubs 

## 2016-02-08 NOTE — ED Notes (Signed)
Patient noted in shower. No complaints, stable, in no acute distress. Q15 minute rounds and monitoring via Security Cameras to continue.  

## 2016-02-08 NOTE — ED Notes (Signed)
Patient noted sleeping in room. No complaints, stable, in no acute distress. Q15 minute rounds and monitoring via Security Cameras to continue.  

## 2016-02-08 NOTE — ED Notes (Signed)
On the phone 

## 2016-02-08 NOTE — ED Notes (Addendum)
Pt reports good results from prune juice, remains calm and cooperative

## 2016-02-08 NOTE — ED Notes (Signed)
Patient noted in room. No complaints, stable, in no acute distress. Q15 minute rounds and monitoring via Security Cameras to continue.  

## 2016-02-08 NOTE — ED Notes (Signed)
Pt up in room and hall,increasingly aggitated, tearful

## 2016-02-08 NOTE — ED Notes (Signed)
Visitor Duanne Guesshomas Hollaway identifies his self and by the patient as her court appointed guardian for the next 9 months.  He reports that she lives with him and that she is suppose to be in court tomorrow morning.  Asked to bring bring papers in.

## 2016-02-08 NOTE — ED Notes (Signed)
Patient noted in hall. No complaints, stable, in no acute distress. Q15 minute rounds and monitoring via Security Cameras to continue. 

## 2016-02-08 NOTE — ED Notes (Signed)
In the dayroom talking w/ other pt 

## 2016-02-08 NOTE — ED Notes (Signed)
OK to give zyprexa in pregnancy per dr Lenore CordiaAkintyo

## 2016-02-08 NOTE — ED Notes (Addendum)
In the bathroom, pt w/ small amt of diarrhea/mixed stool, no abd pain

## 2016-02-08 NOTE — ED Notes (Signed)
No abd pain, pt reports that she has not had a bowel movement in 2 days,  Pt encouraged to drink prune juice/apple sauce.  Pt declined apple sauce. Up walking in the hall.

## 2016-02-08 NOTE — ED Notes (Signed)
Up in the hall, nad.  Pt reported a HA, tylenol given.  Pt declined to take bith tabs and only took one tabled.

## 2016-02-08 NOTE — ED Notes (Signed)
Registration into talk w/ pt

## 2016-02-08 NOTE — ED Notes (Signed)
FHT-132 bpm mid line below umbilicus

## 2016-02-08 NOTE — ED Notes (Signed)
Visitor into see 

## 2016-02-08 NOTE — ED Notes (Signed)
Pt. Up to the rest room to vomit. Ginger ale given with instructions to take sips to settle her stomach.

## 2016-02-08 NOTE — Consult Note (Signed)
[facility] Face-to-Face Psychiatry Consult   Reason for Consult:  Bizarre behavior Referring Physician:  EDP Patient Identification: Makayla Medina MRN:  [id number] Principal Diagnosis: <principal problem not specified> Diagnosis:   Patient Active Problem List   Diagnosis Date Noted  . Schizoaffective disorder, bipolar type (HCC) [F25.0] 11/24/2015  . First trimester pregnancy [Z33.1] 11/24/2015  . Supervision of high-risk pregnancy [O09.90] 10/08/2015  . 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 Cannabis use disorder, moderate, dependence (HCC) [F12.20] 07/23/2015  . Cocaine use disorder, severe, dependence (HCC) [F14.20] 07/22/2015    Total Time spent with patient: 30 minutes  Subjective:   Makayla Medina is a 30 y.o. female patient presented to [facility] coming from [facility] hospital after presenting there exhibiting bizarre behaviors.  HPI:  Makayla Medina is a 30 year old black female who was referred to [facility] after presenting to [facility] with bizarre behavior.  Patient has been to [facility] hospital twice with the complaints of abdominal pain and testing positive for cocaine and THC.  During interview patient is disorganized and  thought blocking.  Patient has an ankle monitor and her guardian was at her bedside.  Patient denies that she has done any cocaine but states that she has smoked marijuana; patient is also stating that there are certain medications that she will take related to the schedule of drug and what it will do to her baby.  Patient denies suicidal/homicidal ideation, psychosis, and paranoia.  Patient appears disorganized, has thought blocking and confused.    Past Psychiatric History: History of prior inpt/outpt services; and psychotropic management.  Prior admission to [facility]  Risk to Self: Suicidal Ideation: No Suicidal Intent: No Is patient at risk for suicide?: No Suicidal Plan?: No Access to Means: No What has been your use of drugs/alcohol within the last  12 months?: UDS is positive for THC and cocaine  How many times?: 1 Other Self Harm Risks: Pt denies  Triggers for Past Attempts: None known Intentional Self Injurious Behavior: None Risk to Others: Homicidal Ideation: No Thoughts of Harm to Others: No Current Homicidal Intent: No Current Homicidal Plan: No Access to Homicidal Means: No Identified Victim: N/A History of harm to others?: No Assessment of Violence: None Noted Violent Behavior Description: None noted.  Does patient have access to weapons?: No Criminal Charges Pending?: Yes Describe Pending Criminal Charges: Communicating threats, injury to personal property, injury to real property, disorderly contact.  Does patient have a court date: Yes Court Date: 02/12/16 (02/19/16) Prior Inpatient Therapy: Prior Inpatient Therapy: Yes Prior Therapy Dates: 2005, 2012, 2015, 2016 Prior Therapy Facilty/Provider(s): [facility]  Reason for Treatment: Psychosis Prior Outpatient Therapy: Prior Outpatient Therapy:  (unable to assess) Does patient have an ACCT team?: Unknown Does patient have Intensive In-House Services?  : No Does patient have [facility] services? : Unknown Does patient have P4CC services?: Unknown  Past Medical History:  Past Medical History  Diagnosis Date  . Anxiety   . Bipolar disorder (HCC)   . Vaginal Pap smear, abnormal   . Gonorrhea   . Chlamydia   . Schizophrenia [facility])     Past Surgical History  Procedure Laterality Date  . Dilation and curettage of uterus    . Colposcopy     Family History:  Family History  Problem Relation Age of Onset  . Alcoholism Mother    Family Psychiatric  History: Mother alcoholism Social History:  History  Alcohol Use No    Comment: socially     History  Drug Use  .  Yes  . Special: Marijuana, Cocaine    Comment: 3 days ago. Uses once a week.    Social History   Social History  . Marital Status: Single    Spouse Name: N/A  . Number of Children: N/A  . Years of  Education: N/A   Social History Main Topics  . Smoking status: Former Smoker -- 0.25 packs/day    Types: Cigarettes  . Smokeless tobacco: None  . Alcohol Use: No     Comment: socially  . Drug Use: Yes    Special: Marijuana, Cocaine     Comment: 3 days ago. Uses once a week.  Marland Kitchen. Sexual Activity: Not Currently     Comment: pt will niot answer   Other Topics Concern  . None   Social History Narrative   Additional Social History:    Allergies:  No Known Allergies  Labs:  Results for orders placed or performed during the hospital encounter of 02/07/16 (from the past 48 hour(s))  Urinalysis, Routine w reflex microscopic (not at [facility])     Status: Abnormal   Collection Time: 02/07/16 12:15 PM  Result Value Ref Range   Color, Urine AMBER (A) YELLOW    Comment: BIOCHEMICALS MAY BE AFFECTED BY COLOR   APPearance CLEAR CLEAR   Specific Gravity, Urine 1.020 1.005 - 1.030   pH 7.5 5.0 - 8.0   Glucose, UA NEGATIVE NEGATIVE mg/dL   Hgb urine dipstick TRACE (A) NEGATIVE   Bilirubin Urine SMALL (A) NEGATIVE   Ketones, ur NEGATIVE NEGATIVE mg/dL   Protein, ur NEGATIVE NEGATIVE mg/dL   Nitrite NEGATIVE NEGATIVE   Leukocytes, UA TRACE (A) NEGATIVE  Urine microscopic-add on     Status: Abnormal   Collection Time: 02/07/16 12:15 PM  Result Value Ref Range   Squamous Epithelial / LPF 6-30 (A) NONE SEEN   WBC, UA 0-5 0 - 5 WBC/hpf   RBC / HPF 0-5 0 - 5 RBC/hpf   Bacteria, UA RARE (A) NONE SEEN   Urine-Other MUCOUS PRESENT   Urine rapid drug screen (hosp performed)     Status: Abnormal   Collection Time: 02/07/16 12:15 PM  Result Value Ref Range   Opiates NONE DETECTED NONE DETECTED   Cocaine POSITIVE (A) NONE DETECTED   Benzodiazepines NONE DETECTED NONE DETECTED   Amphetamines NONE DETECTED NONE DETECTED   Tetrahydrocannabinol POSITIVE (A) NONE DETECTED   Barbiturates NONE DETECTED NONE DETECTED    Comment:        DRUG SCREEN FOR MEDICAL PURPOSES ONLY.  IF CONFIRMATION IS  NEEDED FOR ANY PURPOSE, NOTIFY LAB WITHIN 5 DAYS.        LOWEST DETECTABLE LIMITS FOR URINE DRUG SCREEN Drug Class       Cutoff (ng/mL) Amphetamine      1000 Barbiturate      200 Benzodiazepine   200 Tricyclics       300 Opiates          300 Cocaine          300 THC              50   Ethanol (ETOH)     Status: None   Collection Time: 02/07/16  1:56 PM  Result Value Ref Range   Alcohol, Ethyl (B) <5 <5 mg/dL    Comment:        LOWEST DETECTABLE LIMIT FOR SERUM ALCOHOL IS 5 mg/dL FOR MEDICAL PURPOSES ONLY   CBC     Status: Abnormal   Collection  Time: 02/07/16  1:56 PM  Result Value Ref Range   WBC 8.3 4.0 - 10.5 K/uL   RBC 3.64 (L) 3.87 - 5.11 MIL/uL   Hemoglobin 11.1 (L) 12.0 - 15.0 g/dL   HCT 16.1 (L) 09.6 - 04.5 %   MCV 85.7 78.0 - 100.0 fL   MCH 30.5 26.0 - 34.0 pg   MCHC 35.6 30.0 - 36.0 g/dL   RDW 40.9 81.1 - 91.4 %   Platelets 153 150 - 400 K/uL  I-Stat Chem 8, ED     Status: Abnormal   Collection Time: 02/07/16  2:05 PM  Result Value Ref Range   Sodium 138 135 - 145 mmol/L   Potassium 3.0 (L) 3.5 - 5.1 mmol/L   Chloride 103 101 - 111 mmol/L   BUN <3 (L) 6 - 20 mg/dL   Creatinine, Ser 7.82 0.44 - 1.00 mg/dL   Glucose, Bld 96 65 - 99 mg/dL   Calcium, Ion 9.56 2.13 - 1.23 mmol/L   TCO2 20 0 - 100 mmol/L   Hemoglobin 11.2 (L) 12.0 - 15.0 g/dL   HCT 08.6 (L) 57.8 - 46.9 %    Current Facility-Administered Medications  Medication Dose Route Frequency Provider Last Rate Last Dose  . acetaminophen (TYLENOL) tablet 650 mg  650 mg Oral Q4H PRN Courteney Lyn Mackuen, MD      . haloperidol lactate (HALDOL) injection 5 mg  5 mg Intramuscular Q6H PRN Duane Lope, NP   5 mg at 02/07/16 1330   Current Outpatient Prescriptions  Medication Sig Dispense Refill  . cephALEXin (KEFLEX) 500 MG capsule Take 1 capsule (500 mg total) by mouth every 8 (eight) hours. 5 capsule 0  . metroNIDAZOLE (FLAGYL) 500 MG tablet Take 1 tablet (500 mg total) by mouth 2 (two) times daily.  14 tablet 0  . neomycin-bacitracin-polymyxin (NEOSPORIN) OINT Apply 1 application topically as needed for wound care. (Patient not taking: Reported on 02/08/2016) 1 Tube 0  . OLANZapine zydis (ZYPREXA) 10 MG disintegrating tablet Take 1 tablet (10 mg total) by mouth at bedtime. 30 tablet 0    Musculoskeletal: Strength & Muscle Tone: within normal limits Gait & Station: normal Patient leans: N/A  Psychiatric Specialty Exam: Review of Systems  Constitutional:       [redacted] weeks pregnant   Psychiatric/Behavioral: Positive for substance abuse. The patient is nervous/anxious and has insomnia.        Disorganized     Blood pressure 102/62, pulse 88, temperature 98 F (36.7 C), temperature source Oral, resp. rate 18, SpO2 100 %, currently breastfeeding.There is no weight on file to calculate BMI.  General Appearance: Casual and Fairly Groomed  Patent attorney::  Good  Speech:  Clear and Coherent  Volume:  Normal  Mood:  Anxious and Irritable  Affect:  Labile  Thought Process:  Disorganized, Linear and Confused  Orientation:  Full (Time, Place, and Person)  Thought Content:  Rumination  Suicidal Thoughts:  No  Homicidal Thoughts:  No  Memory:  Immediate;   Poor Recent;   Poor Remote;   Poor  Judgement:  Impaired  Insight:  Lacking  Psychomotor Activity:  Restlessness  Concentration:  Poor  Recall:  Poor  Fund of Knowledge:Fair  Language: Good  Akathisia:  No  Handed:  Right  AIMS (if indicated):     Assets:  Desire for Improvement  ADL's:  Intact  Cognition: WNL  Sleep:      Treatment Plan Summary: Daily contact with patient to assess and  evaluate symptoms and progress in treatment, Medication management and Plan Inpatient treatment recommended  Disposition: Recommend psychiatric Inpatient admission when medically cleared.  Assunta Found, NP 02/08/2016 12:37 PM Patient seen face-to-face for psychiatric evaluation, chart reviewed and case discussed with the physician extender and  developed treatment plan. Reviewed the information documented and agree with the treatment plan. Thedore Mins, MD

## 2016-02-08 NOTE — ED Notes (Signed)
Dr Oneita HurtA, josephine NP, shuvon NP into see

## 2016-02-08 NOTE — ED Notes (Signed)
[facility]  He has also brought the charger for the ankle bracelet, put in pt's locker.

## 2016-02-08 NOTE — ED Notes (Signed)
Up to the bathroom 

## 2016-02-08 NOTE — ED Notes (Signed)
Pt's ankle monitor has not been charged since ? Thursday.  Pt reports that she tried to charge it yesterday before she came in and was not able too.  Pt isnot sure who her probation officer is and has not notified them that she is in the hospital.

## 2016-02-08 NOTE — ED Notes (Signed)
Up in the dayroom watching tv

## 2016-02-08 NOTE — ED Notes (Addendum)
Pt up to the bathroom coughing and making herself gag, no vomiting

## 2016-02-08 NOTE — ED Notes (Signed)
Report received from Janie Rambo RN. Patient alert and oriented, warm and dry, in no acute distress. Patient denies SI, HI, AVH and pain. Patient made aware of Q15 minute rounds and security cameras for their safety. Patient instructed to come to me with needs or concerns.  

## 2016-02-09 MED ORDER — DIPHENHYDRAMINE HCL 50 MG/ML IJ SOLN
50.0000 mg | Freq: Once | INTRAMUSCULAR | Status: AC
  Administered 2016-02-09: 50 mg via INTRAMUSCULAR
  Filled 2016-02-09: qty 1

## 2016-02-09 MED ORDER — OLANZAPINE 10 MG IM SOLR: 10.0000 mg | Freq: Once | INTRAMUSCULAR | Status: DC

## 2016-02-09 MED ORDER — OLANZAPINE 10 MG PO TBDP
10.0000 mg | ORAL_TABLET | Freq: Three times a day (TID) | ORAL | Status: DC | PRN
  Administered 2016-02-10 – 2016-02-12 (×3): 10 mg via ORAL
  Filled 2016-02-09 (×2): qty 1

## 2016-02-09 NOTE — ED Notes (Signed)
Patient noted in rest room. No complaints, stable, in no acute distress. Q15 minute rounds and monitoring via Security Cameras to continue.  

## 2016-02-09 NOTE — BH Assessment (Signed)
[facility] Assessment Progress Note  Reassessed pt, who was visiting with her guardian. Client exhibited irritability with guardian and asked him to leave room, "She is here to talk to me ,not you"!. She denied SI, HI, AVH, but exhibited labile emotions and tearful affect, and expressed frustration that she is still here.  Reassured pt that TTS continues to seek placement.  Pt states that she is eating and sleeping well while here.

## 2016-02-09 NOTE — ED Notes (Signed)
Patient noted sleeping in room. No complaints, stable, in no acute distress. Q15 minute rounds and monitoring via Security Cameras to continue.  

## 2016-02-09 NOTE — BH Assessment (Addendum)
[facility] Assessment Progress Note  At 10:09 this Clinical research associatewriter called the Perinatal program at [facility] to inquire about bed availability.  I left a message on voice mail.  Awaiting call back.  Doylene Canninghomas Joslynn Jamroz, MA Triage Specialist [phone]   Addendum:  At 10:30 Kerry DoryLaurie Gardner calls back from [facility].  They will not have a perinatal bed today, but are willing to accept referral for future consideration.  Referral will be faxed.  Doylene Canninghomas Sinai Mahany, MA Triage Specialist [phone]   Addundum:  Referral information has been faxed to [facility].  Doylene Canninghomas Javonta Gronau, MA Triage Specialist [phone]

## 2016-02-09 NOTE — ED Notes (Signed)
Patient alert. Wants help staying asleep. Has cough. Denies feeling ill or sore throat.   Encouragement offered. Snack provided. Benadryl given.  Q 15 safety checks continue.

## 2016-02-10 DIAGNOSIS — F250 Schizoaffective disorder, bipolar type: Secondary | ICD-10-CM | POA: Diagnosis not present

## 2016-02-10 MED ORDER — DIPHENHYDRAMINE HCL 25 MG PO CAPS
50.0000 mg | ORAL_CAPSULE | Freq: Every evening | ORAL | Status: DC | PRN
  Administered 2016-02-10 – 2016-02-12 (×3): 50 mg via ORAL
  Filled 2016-02-10 (×3): qty 2

## 2016-02-10 MED ORDER — MENTHOL 3 MG MT LOZG
1.0000 | LOZENGE | OROMUCOSAL | Status: DC | PRN
  Administered 2016-02-10: 3 mg via ORAL
  Filled 2016-02-10: qty 9

## 2016-02-10 NOTE — BH Assessment (Signed)
Writer referred patient to [facility].  Writer spoke to the Charge Nurse Jennifer.  

## 2016-02-10 NOTE — Consult Note (Signed)
Psychiatric Specialty Exam: Physical Exam  ROS  Blood pressure 107/66, pulse 85, temperature 98 F (36.7 C), temperature source Oral, resp. rate 18, SpO2 100 %, currently breastfeeding.There is no weight on file to calculate BMI.  General Appearance: Casual and Fairly Groomed  Patent attorneyye Contact:: Good  Speech: Clear and Coherent  Volume: Normal  Mood: Anxious and Irritable  Affect: Labile  Thought Process: Disorganized, Linear and Confused  Orientation: Full (Time, Place, and Person)  Thought Content: Rumination  Suicidal Thoughts: No  Homicidal Thoughts: No  Memory: Immediate; Poor Recent; Poor Remote; Poor  Judgement: Impaired  Insight: Lacking  Psychomotor Activity: Restlessness  Concentration: Poor  Recall: Poor  Fund of Knowledge:Fair  Language: Good  Akathisia: No  Handed: Right  AIMS (if indicated):    Assets: Desire for Improvement  ADL's: Intact  Cognition: WNL         Patient remains irritable and angry wanting to leave when she does not get her wishes.  Patient comes to the nursing station pounding on the glass windows and at a time threatened  her nurse for not allowing her to go home.  Patient was medicated with Olanzapine earlier today for agitation.  Patient needs constant redirecting back to her room.  Patient tries to walk into other patient's room disrupting care.  She was placed on 1:1 observation Schizoaffective disorder, bipolar type [facility](HCC)   Plan: Continue plan of care, seek placement at any facility with available bed.   Offer her medications and use Olanzapine as needed for agitation. Dahlia ByesJosephine Onuoha   PMHNP-BC Patient seen face-to-face for psychiatric evaluation, chart reviewed and case discussed with the physician extender and developed treatment plan. Reviewed the information documented and agree with the treatment plan. Thedore MinsMojeed Jaxten Brosh, MD

## 2016-02-10 NOTE — BH Assessment (Signed)
BHH Assessment Progress Note  The following facilities have been contacted to seek placement for this pt, with results as noted:  Beds available, information sent, decision pending:  [facility]   Declined:  [facility] (medical acuity) [facility] [place] (medical acuity)   At capacity:  [facility]   Doylene Canninghomas Stephany Poorman, [place] Triage Specialist [phone]

## 2016-02-10 NOTE — ED Notes (Signed)
Pt was very intrusive and very agitated.  She threatened me over a sandwich.  NP was notified of patients behavior and prn order obtained.  Pt has also been very verbally abusive toward staff today.  15 minute checks and video monitoring continue.

## 2016-02-10 NOTE — ED Notes (Signed)
Patient received asking if she had a bed anywhere yet. It was explained that as soon as one was available I would let her know. Patient seemed ok with this information and requested a throat lozenge, orders received. Will continue to assess..Marland Kitchen

## 2016-02-10 NOTE — Progress Notes (Signed)
8:43am- CSW left message for [facility] [phone] regarding referral status for patient. Name and contact number left for return call.  9:58am- CSW received telephone call from Kerry DoryLaurie Gardner, [facility]. She stated they will not have any discharges on today.  Makayla Medina, LCSWA [phone] ED CSW 02/10/2016 11:07 AM

## 2016-02-11 ENCOUNTER — Inpatient Hospital Stay (HOSPITAL_COMMUNITY): Payer: Medicaid Other

## 2016-02-11 NOTE — ED Notes (Signed)
Pt up at nurses station, loud, demanding, requesting to hear fetal heart beat, Makayla CottonJosephine, FNP notified.

## 2016-02-11 NOTE — ED Notes (Signed)
Pt pacing up and down hall. Pt c/o increased agitation, requesting prn medication. Zyprexa prn administered per MD order.

## 2016-02-11 NOTE — Progress Notes (Signed)
12:08pm- CSW spoke with Kerry DoryLaurie Gardner, [facility] [phone] regarding patient's status. She stated patient would not be appropriate for there facility due to her behaviors. She stated patient would have to be able to participate in programming.   Per NP request, CSW will begin [facility] referral process.  Elenore PaddyLaVonia Diksha Tagliaferro, LCSWA [phone] ED CSW 02/11/2016 2:36 PM

## 2016-02-11 NOTE — BH Assessment (Addendum)
Reassessment:   Writer met with patient face to face. She denies SI, HI, and AVH's. No delusions reported. Patient is labile. Calm and cooperative. Dressed in scrubs. She makes no eye contact starring at the television. Patient sts, "I want to leave". Writer explained that Dr. Darleene Cleaver and Reginold Agent, NP continue to recommend inpatient treatment. TTS to seek placement.

## 2016-02-11 NOTE — Progress Notes (Addendum)
8:5206am- CSW spoke with Kerry DoryLaurie Gardner, [facility] regarding patient's status. She stated they did not expect discharges on today, however, CSW could call back around noon for an update. She also stated she needed updated information on patient.   CSW faxed updated information on patient and confirmation page was received.   Elenore PaddyLaVonia Olufemi Mofield, LCSWA [phone] ED CSW 02/11/2016 8:11 AM

## 2016-02-11 NOTE — ED Notes (Addendum)
Informed EDP of urine specimen collected on 3/18 per Julieanne CottonJosephine, NP - no new orders given from EDP at this time.

## 2016-02-11 NOTE — Progress Notes (Signed)
after pt last [facility]15 she was asked to f[facility]to resume regular services with them [address] [place] [phone]  In 11/15 Patient's psychiatrist was Dr. Penelope GalasHeading with [facility] [phone].

## 2016-02-11 NOTE — BH Assessment (Signed)
BHH Assessment Progress Note  Per Thedore MinsMojeed Akintayo, MD, this pt continues to require psychiatric hospitalization.  [facility] have both declined pt for admission due to her current pregnancy.  Placement will continue to be sought.  Doylene Canninghomas Tyrea Froberg, MA Triage Specialist [phone]

## 2016-02-11 NOTE — ED Notes (Signed)
Ultrasound at bedside

## 2016-02-11 NOTE — Progress Notes (Signed)
CSW completed CRH form for patient, and obtained authorization number/ [id number].  [facility] to [facility] inpatient placement.  Trish MageBrittney Carie Kapuscinski, LCSWA [phone] ED CSW 02/11/2016 11:30 PM

## 2016-02-11 NOTE — BH Assessment (Addendum)
Writer spoke with [facility] Director Sheralyn Boatman(Toni B.) about patient and patient is unable to be admitted due current level of acuity on the unit and patient is requiring "1-on-1." Writer informed [facility] ER Staff (Toyka P., TTS).

## 2016-02-12 DIAGNOSIS — F250 Schizoaffective disorder, bipolar type: Secondary | ICD-10-CM | POA: Diagnosis not present

## 2016-02-12 MED ORDER — NICOTINE 21 MG/24HR TD PT24: 21.0000 mg | MEDICATED_PATCH | Freq: Every day | TRANSDERMAL | Status: DC

## 2016-02-12 NOTE — Progress Notes (Addendum)
8:3609am- CSW spoke with Robinette, [facility] [phone] regarding voicemail that was left at 1:52am stating H&P was needed on patient. CSW informed her that patient is in the emergency department and H&P information was sent with patient's information yesterday. She stated patient is pending for now, as she will not be placed on a wait list until H&P is received.   Elenore PaddyLaVonia Jnae Thomaston, LCSWA [phone] ED CSW 02/12/2016 8:22 AM

## 2016-02-12 NOTE — Progress Notes (Signed)
CSW spoke with Dondra SpryGail at [facility] [phone] to see if patient would meet criteria for their program. She stated they receive information from clients or it can be obtained from someone on clients behalf. She provided information on some of there services and she provided the contact number for Brenton Grillsshley Johnson [phone] to speak with her as well.   CSW called to speak with Brenton GrillsAshley Johnson, [facility]. Name and contact number was left for return call.   Elenore PaddyLaVonia Tyyne Cliett, LCSWA [phone] ED CSW 02/12/2016 2:29 PM

## 2016-02-12 NOTE — ED Provider Notes (Signed)
9:52 AM Pt is calm and cooperative today. She seems focused on the birth of her new child and the relationship her new child will have with her 29 year old.  She states she feels much better on medication and feels as though is thinking more clearly. She slept well last night  Review of Systems  Constitutional: Negative for fever and chills.  Respiratory: Negative.  Negative for cough and hemoptysis.   Cardiovascular: Negative for chest pain, palpitations and leg swelling.  Gastrointestinal: Negative for nausea, vomiting, abdominal pain and diarrhea.  Genitourinary: Negative for dysuria, urgency and frequency.  Musculoskeletal: Negative for myalgias.  Skin: Negative for rash.  Neurological: Negative for dizziness, tingling and tremors.  Psychiatric/Behavioral: Negative for suicidal ideas.     Azalia BilisKevin Monai Hindes, MD 02/12/16 [phone]

## 2016-02-12 NOTE — Progress Notes (Signed)
Pt seen by [facility] staff, Stacy referred to women's clinic

## 2016-02-12 NOTE — Progress Notes (Signed)
1710  Dr. Jolayne Pantheronstant notified of NST reassuring for GA.

## 2016-02-12 NOTE — Progress Notes (Signed)
I spoke with Dr. Jolayne Pantheronstant regarding this patient [facility].  She states she was unaware of patient.  Information on pregnancy history and history of current hospitalization given.  Orders for NST QD if patient will allow or doppler FHT's if patient will allow.  If patient will not allow either assessment, then RROB RN will document refusal in chart.

## 2016-02-12 NOTE — ED Notes (Signed)
OB nurse at bedside 

## 2016-02-12 NOTE — Progress Notes (Signed)
CSW spoke with patient at bedside to determine if she she has a guardian. Patient states that her guardian is Karenann Cai.  CSW asked patient was Marcello Moores a [place] appointed guardian, and she says yes. CSW reached out to claimed guardian/Thomas with patient's permission.  Marcello Moores states that he is the patient's guardian.  He states that he has been court appointed to be the patient's guardian and is not a [place] guardian.  However, he states he cannot provide paperwork at this time. He informed CSW that when he met patient she was homeless, but now states that she lives with him in the [place].  CSW inquired about patient's support system. According to Marcello Moores, he states that the patient has an adoptive father in [place], whom she does not have a good relationship with. Also, he states that the patient's mother lives in [place] and that the two have a tenuous relationship. He also reports that the patient's biological father lives in [place] and says that they have a good relationship.  CSW asked Marcello Moores would he be able to come out today to view patient and give his opinion if she is presenting at baseline. He states that he will not be able to visit patient today due to him being out of town and in [place] currently. He informed CSW that he would be able to visit patient tomorrow morning around 10:00am-11:00am.  Karenann Cai [phone] [address] [place] ED CSW 02/12/2016 4:36 PM

## 2016-02-12 NOTE — Consult Note (Signed)
Psychiatric Specialty Exam: Physical Exam  ROS  Blood pressure 106/64, pulse 91, temperature 97.5 F (36.4 C), temperature source Oral, resp. rate 16, SpO2 99 %, currently breastfeeding.There is no weight on file to calculate BMI.  General Appearance: Casual and Fairly Groomed  Patent attorneyye Contact::  Good  Speech:  Clear and Coherent and Normal Rate  Volume:  Normal  Mood:  Anxious  Affect:  Congruent  Thought Process:  Coherent, Goal Directed and Intact  Orientation:  Full (Time, Place, and Person)  Thought Content:  WDL  Suicidal Thoughts:  No  Homicidal Thoughts:  No  Memory:  Immediate;   Good Recent;   Good Remote;   Good  Judgement:  Fair  Insight:  Good  Psychomotor Activity:  Normal  Concentration:  Good  Recall:  NA  Fund of Knowledge:  Good  Language:  Good  Akathisia:  No  Handed:  Right  AIMS (if indicated):     Assets:  Desire for Improvement  ADL's:  Intact  Cognition:  WNL  Sleep:       Patient has remained calm and cooperative.  She has not had anger outburst today.  She participated in her evaluation and was engaged all true.  Patient understands that using Cocaine and Marijuana is dangerous to her unborn baby.  Patient is looking forward to [place] for rehabilitation and treatment.  She has been on the phone all day making  Phone calls to the facility.  She is eating and drinking well.  She is complianr with her medications and states that Olanzapine keeps her calm.  Patient denies SI/HI/AVH. Schizoaffective disorder, bipolar type (HCC)   Plan:  Plan for d/c tomorrow.  Continue to offer medications and safety.  Dahlia ByesJosephine Onuoha   PMHNP-BC Patient seen face-to-face for psychiatric evaluation, chart reviewed and case discussed with the physician extender and developed treatment plan. Reviewed the information documented and agree with the treatment plan. Thedore MinsMojeed Mykael Trott, MD

## 2016-02-12 NOTE — Progress Notes (Signed)
ED CM verified with ED registration that pt does not have any medicaid coverage   

## 2016-02-12 NOTE — ED Notes (Addendum)
Pt at time of assessment denies depression, anxiety, pain, SI, HI or AVH; states, "Although I have PTSD and bipolar, I am doing fine at the moment." Pt could also be manipulative; Pt ask different staff members for food and when she was asked about her continuously eating she states, "I am pregnant, I am practically eating for two (Pt is actually pregnant)." Support, encouragement, and safe environment provided. Will continue to monitor for safety via security cameras and Q 15 minute checks.

## 2016-02-12 NOTE — Progress Notes (Addendum)
ED CM confirmed with [facility] [address] that the pt is not an active pt at the facility   [facility] meeting Pt has voiced interest in [facility] CM called and spoke with Noreene LarssonJill at [phone] to review the pt to see if she is a candidate for the program.  Pending a return call from ConwayJill to [facility] or main ED TTS staff number [phone]

## 2016-02-12 NOTE — ED Notes (Addendum)
Pt up at nurses station, loud, demanding. Pt requesting prn medication for agitaton.

## 2016-02-13 MED ORDER — POTASSIUM CHLORIDE CRYS ER 10 MEQ PO TBCR
10.0000 meq | EXTENDED_RELEASE_TABLET | Freq: Every day | ORAL | Status: DC
  Administered 2016-02-13: 10 meq via ORAL
  Filled 2016-02-13: qty 1

## 2016-02-13 NOTE — ED Notes (Signed)
Pt continues to endorse passive SI; contracts for safety. Pt endorses moderate anxiety, moderate depression, hopelessness, helplessness, irritability and HI towards ex-boyfriend; states, "I would continue to feel this way as long as that man (ex-boyfriend) continue to walk freely out there. Pt also complained of moderate generalized body pain. Pt remained calm and cooperative through the shift assessment. Support, encouragement, and safe environment provided. Will continue to monitor for safety via security cameras and Q 15 minute checks. 

## 2016-02-13 NOTE — BH Assessment (Signed)
BHH Assessment Progress Note  Per Thedore MinsMojeed Akintayo, MD, this pt no longer requires psychiatric hospitalization at this time.  She presents under IVC initiated by EDP Kandis Mannanourtney Mackuen, MD.  [facility].  IVC has been rescinded.  Discharge instructions [facility]  Pt's nurse, Kendal Hymendie, has been notified.  Doylene Canninghomas Renelle Stegenga, MA Triage Specialist [phone]

## 2016-02-13 NOTE — BHH Suicide Risk Assessment (Signed)
Suicide Risk Assessment  Discharge Assessment   [facility]   Principal Problem: Schizoaffective disorder, bipolar type [facility](HCC) Discharge Diagnoses:  Patient Active Problem List   Diagnosis Date Noted  . Schizoaffective disorder, bipolar type (HCC) [F25.0] 11/24/2015    Priority: High  . Cannabis use disorder, moderate, dependence (HCC) [F12.20] 07/23/2015    Priority: High  . Cocaine use disorder, severe, dependence (HCC) [F14.20] 07/22/2015    Priority: High  . First trimester pregnancy [Z33.1] 11/24/2015  . Supervision of high-risk pregnancy [O09.90] 10/08/2015  . Hyperprolactinemia (HCC) [E22.1] 07/25/2015    Total Time spent with patient: 30 minutes   Musculoskeletal: Strength & Muscle Tone: within normal limits Gait & Station: normal Patient leans: N/A  Psychiatric Specialty Exam: Review of Systems  Constitutional: Negative.   HENT: Negative.   Eyes: Negative.   Respiratory: Negative.   Cardiovascular: Negative.   Gastrointestinal: Negative.   Genitourinary: Negative.   Musculoskeletal: Negative.   Skin: Negative.   Neurological: Negative.   Endo/Heme/Allergies: Negative.   Psychiatric/Behavioral: Negative.     Blood pressure 103/60, pulse 83, temperature 98.2 F (36.8 C), temperature source Oral, resp. rate 19, SpO2 99 %, currently breastfeeding.There is no weight on file to calculate BMI.  General Appearance: Casual  Eye Contact::  Good  Speech:  Normal Rate  Volume:  Normal  Mood:  Anxious  Affect:  Congruent  Thought Process:  Coherent  Orientation:  Full (Time, Place, and Person)  Thought Content:  WDL  Suicidal Thoughts:  No  Homicidal Thoughts:  No  Memory:  Immediate;   Good Recent;   Good Remote;   Good  Judgement:  Fair  Insight:  Fair  Psychomotor Activity:  Normal  Concentration:  Good  Recall:  Good  Fund of Knowledge:Good  Language: Good  Akathisia:  No  Handed:  Right  AIMS (if indicated):     Assets:   Housing Leisure Time Physical Health Resilience Social Support  ADL's:  Intact  Cognition: WNL  Sleep:       Mental Status Per Nursing Assessment::   On Admission:   Mood instabilization  Demographic Factors:  NA  Loss Factors: NA  Historical Factors: NA  Risk Reduction Factors:   Responsible for children under 29 years of age, Sense of responsibility to family, Living with another person, especially a relative, Positive social support and Positive therapeutic relationship  Continued Clinical Symptoms:  Anxiety, mild  Cognitive Features That Contribute To Risk:  None    Suicide Risk:  Minimal: No identifiable suicidal ideation.  Patients presenting with no risk factors but with morbid ruminations; may be classified as minimal risk based on the severity of the depressive symptoms    Plan Of Care/Follow-up recommendations:  Activity:  as tolerated Diet:  heart healthy diet  Jerra Huckeby, NP 02/13/2016, 11:52 AM

## 2016-02-13 NOTE — Progress Notes (Signed)
CSW spoke with patient and provided her with the referral form for [facility] to complete. CSW informed patient of the contact name, Brenton Grillsshley Johnson and contact number on the referral to call and speak with her regarding the service. Patient provided names of the Court Counselor, Will Emelda FearFerguson [phone] and Pauletta BrownsK. Leggett, Engineer, drillingrobation Officer. Patient reports she is on probation for marijuana charges and running from the court.

## 2016-02-13 NOTE — ED Notes (Signed)
Pt discharged ambulatory with [facility] instructions [facility].  Pt was in no distress.  Bus pass given to pt.

## 2016-02-13 NOTE — ED Notes (Signed)
Pt is agitated this morning that she has not been accepted at another facility.  She is hyperverbal and refuses to understand that we have no control over that.  She is also very agitated on breakfast being late as many other patients are as well.  Service response has been called and we were told they are working on breakfast orders.

## 2016-02-13 NOTE — Discharge Instructions (Signed)
For your ongoing behavioral health needs you are advised to follow up with [facility].  New patients are seen at their walk-in clinic.  Walk-in hours are Monday - Friday from 8:00 am - 12:00 pm, and from 1:00 pm - 3:00 pm.  Walk-in patients are seen on a first come, first served basis, so try to arrive as early as possible for the best chance of being seen the same day.  There is an initial fee of $22.50: ° °     [facility] °     [address] °     [place], [place] [address] °     [phone] °

## 2016-02-13 NOTE — Progress Notes (Addendum)
8:37am- CSW spoke with Domingo Sepony Fl[place]ory [place] [facility] to see if patient had a DSS guardian. Per Mr. Anell BarrFlanory, patient does not have a DSS Guardian [place] [facility].   8:42am- CSW left another message for Brenton Grillsshley Johnson, [facility] [phone]. Name and contact number left for return call.   9:19am- CSW called and spoke with Percival SpanishJill Hunter, [facility] [phone]. She stated she was going to speak with Brenton GrillsAshley Johnson and call back.  9:29am- CSW received a telephone call from [facility]. She stated she could send the referral form to be completed for patient. She also stated their process can take weeks to months for approval. She also stated she does not have any openings at this time, however, she stated she would see if another program had any openings at this time.    Elenore PaddyLaVonia Djon Tith, LCSWA [phone] ED CSW 02/13/2016 8:45 AM

## 2016-02-13 NOTE — Consult Note (Signed)
[facility] Face-to-Face Psychiatry Consult   Reason for Consult:  Mood instability Referring Physician:  EDP Patient Identification: Makayla Medina MRN:  [id number] Principal Diagnosis: Schizoaffective disorder, bipolar type [facility]) Diagnosis:   Patient Active Problem List   Diagnosis Date Noted  . Schizoaffective disorder, bipolar type (HCC) [F25.0] 11/24/2015    Priority: High  . Cannabis use disorder, moderate, dependence (HCC) [F12.20] 07/23/2015    Priority: High  . Cocaine use disorder, severe, dependence (HCC) [F14.20] 07/22/2015    Priority: High  . First trimester pregnancy [Z33.1] 11/24/2015  . Supervision of high-risk pregnancy [O09.90] 10/08/2015  . Hyperprolactinemia (HCC) [E22.1] 07/25/2015    Total Time spent with patient: 30 minutes  Subjective:   Makayla Medina is a 30 y.o. female patient does not warrant admission.  HPI:  Patient was restarted on medications and stabilized over the past week.  She discovered she is her own guardian and wants to discharge.  Denies suicidal/homicidal ideations, hallucinations, and alcohol/drug withdrawal symptoms.  Stable to return home with follow-up with her outpatient provider.  Past Psychiatric History: schizoaffective disorder, bipolar type; cocaine and marijuana dependence  Risk to Self: Suicidal Ideation: No Suicidal Intent: No Is patient at risk for suicide?: No Suicidal Plan?: No Access to Means: No What has been your use of drugs/alcohol within the last 12 months?: UDS is positive for THC and cocaine  How many times?: 1 Other Self Harm Risks: Pt denies  Triggers for Past Attempts: None known Intentional Self Injurious Behavior: None Risk to Others: Homicidal Ideation: No Thoughts of Harm to Others: No Current Homicidal Intent: No Current Homicidal Plan: No Access to Homicidal Means: No Identified Victim: N/A History of harm to others?: No Assessment of Violence: None Noted Violent Behavior Description: None noted.   Does patient have access to weapons?: No Criminal Charges Pending?: Yes Describe Pending Criminal Charges: Communicating threats, injury to personal property, injury to real property, disorderly contact.  Does patient have a court date: Yes Court Date: 02/12/16 (02/19/16) Prior Inpatient Therapy: Prior Inpatient Therapy: Yes Prior Therapy Dates: 2005, 2012, 2015, 2016 Prior Therapy Facilty/Provider(s): [facility]  Reason for Treatment: Psychosis Prior Outpatient Therapy: Prior Outpatient Therapy:  (unable to assess) Does patient have an ACCT team?: Unknown Does patient have Intensive In-House Services?  : No Does patient have [facility] services? : Unknown Does patient have P4CC services?: Unknown  Past Medical History:  Past Medical History  Diagnosis Date  . Anxiety   . Bipolar disorder (HCC)   . Vaginal Pap smear, abnormal   . Gonorrhea   . Chlamydia   . Schizophrenia [facility])     Past Surgical History  Procedure Laterality Date  . Dilation and curettage of uterus    . Colposcopy     Family History:  Family History  Problem Relation Age of Onset  . Alcoholism Mother    Family Psychiatric  History: None Social History:  History  Alcohol Use No    Comment: socially     History  Drug Use  . Yes  . Special: Marijuana, Cocaine    Comment: 3 days ago. Uses once a week.    Social History   Social History  . Marital Status: Single    Spouse Name: N/A  . Number of Children: N/A  . Years of Education: N/A   Social History Main Topics  . Smoking status: Former Smoker -- 0.25 packs/day    Types: Cigarettes  . Smokeless tobacco: None  . Alcohol Use: No  Comment: socially  . Drug Use: Yes    Special: Marijuana, Cocaine     Comment: 3 days ago. Uses once a week.  Marland Kitchen Sexual Activity: Not Currently     Comment: pt will niot answer   Other Topics Concern  . None   Social History Narrative   Additional Social History:    Allergies:  No Known Allergies  Labs: No  results found for this or any previous visit (from the past 48 hour(s)).  Current Facility-Administered Medications  Medication Dose Route Frequency Provider Last Rate Last Dose  . acetaminophen (TYLENOL) tablet 650 mg  650 mg Oral Q4H PRN Courteney Lyn Mackuen, MD   325 mg at 02/08/16 1600  . diphenhydrAMINE (BENADRYL) capsule 50 mg  50 mg Oral QHS PRN Lavera Guise, MD   50 mg at 02/12/16 2156  . menthol-cetylpyridinium (CEPACOL) lozenge 3 mg  1 lozenge Oral PRN Lavera Guise, MD   3 mg at 02/10/16 2002  . OLANZapine (ZYPREXA) injection 10 mg  10 mg Intramuscular Once Charm Rings, NP   Stopped at 02/09/16 1849  . OLANZapine zydis (ZYPREXA) disintegrating tablet 10 mg  10 mg Oral Q8H PRN Charm Rings, NP   10 mg at 02/12/16 1538  . OLANZapine zydis (ZYPREXA) disintegrating tablet 5 mg  5 mg Oral BID PC Agamjot Kilgallon, MD   5 mg at 02/13/16 0902  . potassium chloride (K-DUR,KLOR-CON) CR tablet 10 mEq  10 mEq Oral Daily Charm Rings, NP   10 mEq at 02/13/16 0902  . prenatal multivitamin tablet 1 tablet  1 tablet Oral Q1200 Thedore Mins, MD   1 tablet at 02/13/16 0902   Current Outpatient Prescriptions  Medication Sig Dispense Refill  . omega-3 acid ethyl esters (LOVAZA) 1 g capsule Take 1 g by mouth daily.    . Prenatal Vit-Fe Fumarate-FA (PRENATAL MULTIVITAMIN) TABS tablet Take 1 tablet by mouth daily at 12 noon.    . vitamin B-12 (CYANOCOBALAMIN) 1000 MCG tablet Take 1,000 mcg by mouth daily.    . cephALEXin (KEFLEX) 500 MG capsule Take 1 capsule (500 mg total) by mouth every 8 (eight) hours. 5 capsule 0  . metroNIDAZOLE (FLAGYL) 500 MG tablet Take 1 tablet (500 mg total) by mouth 2 (two) times daily. 14 tablet 0  . neomycin-bacitracin-polymyxin (NEOSPORIN) OINT Apply 1 application topically as needed for wound care. (Patient not taking: Reported on 02/08/2016) 1 Tube 0  . OLANZapine zydis (ZYPREXA) 10 MG disintegrating tablet Take 1 tablet (10 mg total) by mouth at bedtime. 30 tablet 0     Musculoskeletal: Strength & Muscle Tone: within normal limits Gait & Station: normal Patient leans: N/A  Psychiatric Specialty Exam: Review of Systems  Constitutional: Negative.   HENT: Negative.   Eyes: Negative.   Respiratory: Negative.   Cardiovascular: Negative.   Gastrointestinal: Negative.   Genitourinary: Negative.   Musculoskeletal: Negative.   Skin: Negative.   Neurological: Negative.   Endo/Heme/Allergies: Negative.   Psychiatric/Behavioral: Negative.     Blood pressure 103/60, pulse 83, temperature 98.2 F (36.8 C), temperature source Oral, resp. rate 19, SpO2 99 %, currently breastfeeding.There is no weight on file to calculate BMI.  General Appearance: Casual  Eye Contact::  Good  Speech:  Normal Rate  Volume:  Normal  Mood:  Anxious  Affect:  Congruent  Thought Process:  Coherent  Orientation:  Full (Time, Place, and Person)  Thought Content:  WDL  Suicidal Thoughts:  No  Homicidal Thoughts:  No  Memory:  Immediate;   Good Recent;   Good Remote;   Good  Judgement:  Fair  Insight:  Fair  Psychomotor Activity:  Normal  Concentration:  Good  Recall:  Good  Fund of Knowledge:Good  Language: Good  Akathisia:  No  Handed:  Right  AIMS (if indicated):     Assets:  Housing Leisure Time Physical Health Resilience Social Support  ADL's:  Intact  Cognition: WNL  Sleep:      Treatment Plan Summary: Daily contact with patient to assess and evaluate symptoms and progress in treatment, Medication management and Plan schizoaffective disorder, bipolar type:  -Crisis stabilization -Medication management:  Continue Zyprexa 5 mg BID for psychosis and mood stabilization, Benadryl 50 mg at bedtime PRN sleep and agitation, Zyprexa 10 mg every 8 hours PRN agitation (up to 30 mg daily) -Individual and substance abuse counseling  Disposition: No evidence of imminent risk to self or others at present.    Nanine MeansLORD, JAMISON, NP 02/13/2016 10:52 AM Patient seen  face-to-face for psychiatric evaluation, chart reviewed and case discussed with the physician extender and developed treatment plan. Reviewed the information documented and agree with the treatment plan. Thedore MinsMojeed Somnang Mahan, MD

## 2016-02-24 ENCOUNTER — Ambulatory Visit (HOSPITAL_COMMUNITY)
Admission: RE | Admit: 2016-02-24 | Discharge: 2016-02-24 | Disposition: A | Discharge status: Home / Self Care | Payer: Medicaid Other | Attending: Psychiatry | Admitting: Psychiatry

## 2016-02-24 ENCOUNTER — Emergency Department (HOSPITAL_COMMUNITY)
Admission: EM | Admit: 2016-02-24 | Discharge: 2016-02-28 | Disposition: A | Discharge status: Home / Self Care | Payer: Medicaid Other | Attending: Emergency Medicine | Admitting: Emergency Medicine

## 2016-02-24 ENCOUNTER — Encounter (HOSPITAL_COMMUNITY): Payer: Self-pay

## 2016-02-24 DIAGNOSIS — Z3A00 Weeks of gestation of pregnancy not specified: Secondary | ICD-10-CM | POA: Diagnosis not present

## 2016-02-24 DIAGNOSIS — O98212 Gonorrhea complicating pregnancy, second trimester: Secondary | ICD-10-CM | POA: Diagnosis not present

## 2016-02-24 DIAGNOSIS — Z046 Encounter for general psychiatric examination, requested by authority: Secondary | ICD-10-CM | POA: Diagnosis not present

## 2016-02-24 DIAGNOSIS — R45850 Homicidal ideations: Secondary | ICD-10-CM | POA: Insufficient documentation

## 2016-02-24 DIAGNOSIS — F419 Anxiety disorder, unspecified: Secondary | ICD-10-CM | POA: Diagnosis not present

## 2016-02-24 DIAGNOSIS — Z3A28 28 weeks gestation of pregnancy: Secondary | ICD-10-CM | POA: Diagnosis not present

## 2016-02-24 DIAGNOSIS — O9989 Other specified diseases and conditions complicating pregnancy, childbirth and the puerperium: Secondary | ICD-10-CM | POA: Diagnosis present

## 2016-02-24 DIAGNOSIS — R45851 Suicidal ideations: Secondary | ICD-10-CM | POA: Insufficient documentation

## 2016-02-24 DIAGNOSIS — F319 Bipolar disorder, unspecified: Secondary | ICD-10-CM | POA: Diagnosis not present

## 2016-02-24 DIAGNOSIS — F1210 Cannabis abuse, uncomplicated: Secondary | ICD-10-CM | POA: Diagnosis not present

## 2016-02-24 DIAGNOSIS — F209 Schizophrenia, unspecified: Secondary | ICD-10-CM | POA: Insufficient documentation

## 2016-02-24 DIAGNOSIS — R451 Restlessness and agitation: Secondary | ICD-10-CM | POA: Diagnosis not present

## 2016-02-24 DIAGNOSIS — Z3490 Encounter for supervision of normal pregnancy, unspecified, unspecified trimester: Secondary | ICD-10-CM

## 2016-02-24 DIAGNOSIS — F1410 Cocaine abuse, uncomplicated: Secondary | ICD-10-CM | POA: Diagnosis not present

## 2016-02-24 DIAGNOSIS — N898 Other specified noninflammatory disorders of vagina: Secondary | ICD-10-CM | POA: Insufficient documentation

## 2016-02-24 DIAGNOSIS — Z79899 Other long term (current) drug therapy: Secondary | ICD-10-CM | POA: Diagnosis not present

## 2016-02-24 DIAGNOSIS — Z590 Homelessness: Secondary | ICD-10-CM | POA: Insufficient documentation

## 2016-02-24 DIAGNOSIS — Z87891 Personal history of nicotine dependence: Secondary | ICD-10-CM | POA: Diagnosis not present

## 2016-02-24 DIAGNOSIS — O99342 Other mental disorders complicating pregnancy, second trimester: Secondary | ICD-10-CM | POA: Insufficient documentation

## 2016-02-24 DIAGNOSIS — F250 Schizoaffective disorder, bipolar type: Secondary | ICD-10-CM | POA: Diagnosis not present

## 2016-02-24 DIAGNOSIS — F1220 Cannabis dependence, uncomplicated: Secondary | ICD-10-CM | POA: Diagnosis not present

## 2016-02-24 DIAGNOSIS — Z331 Pregnant state, incidental: Secondary | ICD-10-CM | POA: Diagnosis not present

## 2016-02-24 DIAGNOSIS — O99322 Drug use complicating pregnancy, second trimester: Secondary | ICD-10-CM | POA: Diagnosis not present

## 2016-02-24 DIAGNOSIS — Z8619 Personal history of other infectious and parasitic diseases: Secondary | ICD-10-CM | POA: Diagnosis not present

## 2016-02-24 DIAGNOSIS — O0990 Supervision of high risk pregnancy, unspecified, unspecified trimester: Secondary | ICD-10-CM

## 2016-02-24 LAB — CBC WITH DIFFERENTIAL/PLATELET
BASOS ABS: 0 10*3/uL (ref 0.0–0.1)
BASOS PCT: 0 %
Eosinophils Absolute: 0.1 10*3/uL (ref 0.0–0.7)
Eosinophils Relative: 1 %
HEMATOCRIT: 32.9 % — AB (ref 36.0–46.0)
HEMOGLOBIN: 11.4 g/dL — AB (ref 12.0–15.0)
LYMPHS PCT: 17 %
Lymphs Abs: 1.6 10*3/uL (ref 0.7–4.0)
MCH: 30 pg (ref 26.0–34.0)
MCHC: 34.7 g/dL (ref 30.0–36.0)
MCV: 86.6 fL (ref 78.0–100.0)
MONO ABS: 0.7 10*3/uL (ref 0.1–1.0)
Monocytes Relative: 7 %
NEUTROS ABS: 7.2 10*3/uL (ref 1.7–7.7)
NEUTROS PCT: 75 %
Platelets: 152 10*3/uL (ref 150–400)
RBC: 3.8 MIL/uL — ABNORMAL LOW (ref 3.87–5.11)
RDW: 12.6 % (ref 11.5–15.5)
WBC: 9.5 10*3/uL (ref 4.0–10.5)

## 2016-02-24 LAB — COMPREHENSIVE METABOLIC PANEL
ALBUMIN: 3.1 g/dL — AB (ref 3.5–5.0)
ALT: 12 U/L — ABNORMAL LOW (ref 14–54)
ANION GAP: 8 (ref 5–15)
AST: 17 U/L (ref 15–41)
Alkaline Phosphatase: 89 U/L (ref 38–126)
BILIRUBIN TOTAL: 0.4 mg/dL (ref 0.3–1.2)
BUN: 6 mg/dL (ref 6–20)
CHLORIDE: 103 mmol/L (ref 101–111)
CO2: 24 mmol/L (ref 22–32)
Calcium: 8.9 mg/dL (ref 8.9–10.3)
Creatinine, Ser: 0.59 mg/dL (ref 0.44–1.00)
GFR calc Af Amer: >60 mL/min (ref >60)
GFR calc non Af Amer: >60 mL/min (ref >60)
GLUCOSE: 99 mg/dL (ref 65–99)
POTASSIUM: 3.8 mmol/L (ref 3.5–5.1)
Sodium: 135 mmol/L (ref 135–145)
TOTAL PROTEIN: 6.7 g/dL (ref 6.5–8.1)

## 2016-02-24 LAB — RAPID URINE DRUG SCREEN, HOSP PERFORMED
AMPHETAMINES: NOT DETECTED
BARBITURATES: NOT DETECTED
BENZODIAZEPINES: NOT DETECTED
COCAINE: NOT DETECTED
Opiates: NOT DETECTED
TETRAHYDROCANNABINOL: POSITIVE — AB

## 2016-02-24 LAB — TYPE AND SCREEN
ABO/RH(D): O POS
Antibody Screen: NEGATIVE

## 2016-02-24 LAB — ETHANOL

## 2016-02-24 MED ORDER — HALOPERIDOL LACTATE 5 MG/ML IJ SOLN
5.0000 mg | Freq: Four times a day (QID) | INTRAMUSCULAR | Status: DC | PRN
  Administered 2016-02-24: 5 mg via INTRAMUSCULAR
  Filled 2016-02-24: qty 1

## 2016-02-24 MED ORDER — ONDANSETRON HCL 4 MG PO TABS: 4.0000 mg | ORAL_TABLET | Freq: Three times a day (TID) | ORAL | Status: DC | PRN

## 2016-02-24 MED ORDER — ACETAMINOPHEN 325 MG PO TABS: 650.0000 mg | ORAL_TABLET | ORAL | Status: DC | PRN

## 2016-02-24 MED ORDER — ALUM & MAG HYDROXIDE-SIMETH 200-200-20 MG/5ML PO SUSP: 30.0000 mL | ORAL | Status: DC | PRN

## 2016-02-24 NOTE — ED Provider Notes (Signed)
CSN: [id number]     Arrival date & time 02/24/16  1441 History   First MD Initiated Contact with Patient 02/24/16 1507     Chief Complaint  Patient presents with  . Medical Clearance  . Suicidal  . Homicidal     (Consider location/radiation/quality/duration/timing/severity/associated sxs/prior Treatment) Patient is a 30 y.o. female presenting with mental health disorder.  Mental Health Problem Presenting symptoms: agitation, depression, homicidal ideas and suicidal thoughts   Presenting symptoms: no hallucinations and no paranoid behavior   Patient accompanied by:  Law enforcement Onset quality:  Gradual Duration:  1 week Timing:  Constant Progression:  Worsening Chronicity:  Recurrent Context: noncompliance and recent medication change   Context: not alcohol use and not drug abuse   Treatment compliance:  Some of the time Relieved by:  None tried Worsened by:  Nothing tried Ineffective treatments:  None tried Associated symptoms: no abdominal pain, no anxiety, no chest pain, no headaches, no insomnia and no irritability     Past Medical History  Diagnosis Date  . Anxiety   . Bipolar disorder (HCC)   . Vaginal Pap smear, abnormal   . Gonorrhea   . Chlamydia   . Schizophrenia [facility](HCC)    Past Surgical History  Procedure Laterality Date  . Dilation and curettage of uterus    . Colposcopy     Family History  Problem Relation Age of Onset  . Alcoholism Mother    Social History  Substance Use Topics  . Smoking status: Former Smoker -- 0.25 packs/day    Types: Cigarettes  . Smokeless tobacco: None  . Alcohol Use: No     Comment: socially   OB History    Gravida Para Term Preterm AB TAB SAB Ectopic Multiple Living   4 1 1  2 2    1      Review of Systems  Constitutional: Negative for fever, chills and irritability.  Eyes: Negative for pain.  Respiratory: Negative for cough, chest tightness and shortness of breath.   Cardiovascular: Negative for chest pain,  palpitations and leg swelling.  Gastrointestinal: Negative for abdominal pain.  Genitourinary: Positive for vaginal discharge (similar for months, has been checked and is negative for STD). Negative for dysuria and hematuria.  Neurological: Negative for headaches.  Psychiatric/Behavioral: Positive for suicidal ideas, homicidal ideas and agitation. Negative for hallucinations and paranoia. The patient is not nervous/anxious and does not have insomnia.   All other systems reviewed and are negative.     Allergies  Review of patient's allergies indicates no known allergies.  Home Medications   Prior to Admission medications   Medication Sig Start Date End Date Taking? Authorizing Provider  OLANZapine zydis (ZYPREXA) 10 MG disintegrating tablet Take 1 tablet (10 mg total) by mouth at bedtime. 11/24/15  Yes Beau FannyJohn C Withrow, FNP  omega-3 acid ethyl esters (LOVAZA) 1 g capsule Take 1 g by mouth daily.   Yes Historical Provider, MD  Prenatal Vit-Fe Fumarate-FA (PRENATAL MULTIVITAMIN) TABS tablet Take 1 tablet by mouth daily at 12 noon.   Yes Historical Provider, MD  vitamin B-12 (CYANOCOBALAMIN) 1000 MCG tablet Take 1,000 mcg by mouth daily.   Yes Historical Provider, MD  cephALEXin (KEFLEX) 500 MG capsule Take 1 capsule (500 mg total) by mouth every 8 (eight) hours. Patient not taking: Reported on 02/24/2016 11/24/15   Beau FannyJohn C Withrow, FNP  metroNIDAZOLE (FLAGYL) 500 MG tablet Take 1 tablet (500 mg total) by mouth 2 (two) times daily. Patient not taking: Reported on 02/24/2016  02/05/16   Dorathy Kinsman, CNM  neomycin-bacitracin-polymyxin (NEOSPORIN) OINT Apply 1 application topically as needed for wound care. Patient not taking: Reported on 02/08/2016 11/24/15   Beau Fanny, FNP   BP 91/48 mmHg  Pulse 86  Temp(Src) 97.8 F (36.6 C) (Oral)  Resp 18  SpO2 99%  LMP  (LMP Unknown) Physical Exam  Constitutional: She appears well-developed and well-nourished.  HENT:  Head: Normocephalic and atraumatic.   Neck: Normal range of motion.  Cardiovascular: Normal rate and regular rhythm.   Pulmonary/Chest: No stridor. No respiratory distress.  Abdominal: She exhibits no distension.  Neurological: She is alert.  Psychiatric: Her affect is labile. She is agitated. She exhibits a depressed mood. She expresses homicidal and suicidal ideation. She expresses no suicidal plans and no homicidal plans.  Nursing note and vitals reviewed.   ED Course  Procedures (including critical care time) Labs Review Labs Reviewed  CBC WITH DIFFERENTIAL/PLATELET - Abnormal; Notable for the following:    RBC 3.80 (*)    Hemoglobin 11.4 (*)    HCT 32.9 (*)    All other components within normal limits  URINE RAPID DRUG SCREEN, HOSP PERFORMED - Abnormal; Notable for the following:    Tetrahydrocannabinol POSITIVE (*)    All other components within normal limits  COMPREHENSIVE METABOLIC PANEL  ETHANOL  RUBELLA ANTIBODY, IGM  RPR  HEPATITIS B SURFACE ANTIGEN  HIV ANTIBODY (ROUTINE TESTING)  TYPE AND SCREEN    Imaging Review No results found. I have personally reviewed and evaluated these images and lab results as part of my medical decision-making.   EKG Interpretation None      MDM   Final diagnoses:  Pregnancy  Suicidal thoughts   Here fro medical clearance and committal. No concerning symptoms (no vaginal bleeding, LOF, does still feel baby move, no contractions), toco and FHR reviewed and reassurring. Will await labs for medical clearance. Has slightly soft BP but on review of previous it is similar to her baseline.   Per obstetrics, patient is stable for psychiatric treatment. FHR ok. They plan to see patient and make recs daily. Also added on some prenatal labs.      Marily Memos, MD 02/25/16 [phone]

## 2016-02-24 NOTE — ED Notes (Signed)
Patient took injection without resistance. Safety maintain and Q 15 min safety checks remain in place.

## 2016-02-24 NOTE — ED Notes (Signed)
Unable to obtain blood at this time due to rapid response at bedside.

## 2016-02-24 NOTE — ED Notes (Signed)
Patient yelling, cursing and threatening staff. Patient stating if she is not moved to TCU by tomorrow she is going to show out. Patient continues to be unable to be re-direct by staff. [facility] Encouragement and support provided and safety maintain. Q 15 min safety checks remain in place.

## 2016-02-24 NOTE — ED Notes (Signed)
Bed: WBH42 Expected date:  Expected time:  Means of arrival:  Comments: Triage 4 

## 2016-02-24 NOTE — Progress Notes (Signed)
1500 In to see this 30 yo G4P1 @29 .[redacted] wks GA in ED for medical and obstetrical clearance for admission to psych.  1535  Patient has been cooperative with fetal monitoring.  NST obtained, FHR Category I with 2 UC's in 30 min, that are not felt by patient. 1543  Dr. Macon LargeAnyanwu notified of patient in ED and of above.  Notified that patient has not received any formal OB care, only sporadic ED visits, some with ultrasounds.  Orders for OB panel and glucose tolerance test received.  If patient will not cooperate with glucose tolerance test then Hgb A1C will be ordered.  Order also received for QD FHT doppler by OB Rapid Response RNs.

## 2016-02-24 NOTE — BH Assessment (Addendum)
Assessment Note  Makayla Medina is an 30 y.o. female who [facility]. She cited reason for presenting as "thoughts of hurting myself, off medicines, homeless, 7 months pregnant, feeling alone, thoughts of hurting others who have harmed me, missing daughter, depressed, anxiety". Pt presented as extremely angry and disclosed little. She was very aggressive and abusive in her language, cursing at and calling this Clinical research associatewriter names and even threatening to hit this Clinical research associatewriter. Information rec'd from pt before she refused to answer any other questions are as follows: Pt is thinking about killing herself and other ppl (no plan was specified); pt has been homeless for over a year; pt is off of her medications b/c she has no insurance; [facility] (as [facility] on her last d/c paperwork) b/c she had no transportation; pt hasn't used drugs, "in a minute".  Pt advised this Clinical research associatewriter that [facility] and [facility], even for med clearance. Subsequently, [facility]  Diagnosis: Unspecified depressive disorder  Past Medical History:  Past Medical History  Diagnosis Date  . Anxiety   . Bipolar disorder (HCC)   . Vaginal Pap smear, abnormal   . Gonorrhea   . Chlamydia   . Schizophrenia [facility](HCC)     Past Surgical History  Procedure Laterality Date  . Dilation and curettage of uterus    . Colposcopy      Family History:  Family History  Problem Relation Age of Onset  . Alcoholism Mother     Social History:  reports that she has quit smoking. Her smoking use included Cigarettes. She smoked 0.25 packs per day. She does not have any smokeless tobacco history on file. She reports that she uses illicit drugs (Marijuana and Cocaine). She reports that she does not drink alcohol.  Additional Social History:  Alcohol / Drug Use Pain Medications: unknown Prescriptions: unknown Over the Counter:  unknown History of alcohol / drug use?: Yes (pt would not respond concerning her drug use, but has tested positive for cocaine and THC less than a month  ago) Longest period of sobriety (when/how long): unknown  CIWA:   COWS:    Allergies: No Known Allergies  Home Medications:  (Not in a hospital admission)  OB/GYN Status:  No LMP recorded (lmp unknown). Patient is pregnant.  General Assessment Data [facility] TTS Assessment: In system Is this a Tele or Face-to-Face Assessment?: Face-to-Face Is this an Initial Assessment or a Re-assessment for this encounter?: Initial Assessment Marital status: Single Is patient pregnant?: Yes Pregnancy Status: Yes (Comment: include estimated delivery date) (EDD is 05/08/16) Living Arrangements: Other (Comment) (pt reports homeless for over a year) Can pt return to current living arrangement?: Yes Admission Status: Voluntary Is patient capable of signing voluntary admission?: Yes Referral Source: Self/Family/Friend Insurance type: none  Medical Screening Exam [facility] Walk[facility] ONLY) Medical Exam completed: No Reason for MSE not completed:  ([facility])  Crisis Care Plan Living Arrangements: Other (Comment) (pt reports homeless for over a year) Name of Psychiatrist: none reported Name of Therapist: none reported  Education Status Is patient currently in school?: No  Risk to self with the past 6 months Suicidal Ideation: Yes-Currently Present Has patient been a risk to self within the past 6 months prior to admission? : Yes Suicidal Intent: Yes-Currently Present Has patient  had any suicidal intent within the past 6 months prior to admission? : Yes Is patient at risk for suicide?: Yes Suicidal Plan?: No Has patient had any suicidal plan within the past 6 months prior to admission? : No Access to Means: No What has been your use of drugs/alcohol within the last 12  months?: pt denies current use and wouldn't elaborate on hx Previous Attempts/Gestures:  (unknown) Triggers for Past Attempts: Unknown Intentional Self Injurious Behavior: None Family Suicide History: Unknown Recent stressful life event(s): Other (Comment) (pt homeless, off meds, pregnant) Persecutory voices/beliefs?: No Depression: Yes Depression Symptoms: Feeling angry/irritable Substance abuse history and/or treatment for substance abuse?:  (unknown) Suicide prevention information given to non-admitted patients: Not applicable  Risk to Others within the past 6 months Homicidal Ideation: Yes-Currently Present Does patient have any lifetime risk of violence toward others beyond the six months prior to admission? : Unknown Thoughts of Harm to Others: Yes-Currently Present Comment - Thoughts of Harm to Others: pt would not specify Current Homicidal Intent: Yes-Currently Present Current Homicidal Plan: No Access to Homicidal Means:  (unknown) Identified Victim: unspecified History of harm to others?:  (unknown) Assessment of Violence: On admission Violent Behavior Description: pt was verbally agressive and threatening, but did not assault anyone Does patient have access to weapons?: No Criminal Charges Pending?:  (unknown) Describe Pending Criminal Charges: unknown Does patient have a court date:  (unknown) Is patient on probation?: Unknown  Psychosis Hallucinations: None noted Delusions: None noted  Mental Status Report Appearance/Hygiene: Unremarkable Eye Contact: Good Motor Activity: Unremarkable Speech: Abusive, Aggressive Level of Consciousness: Irritable, Alert Mood: Angry Affect: Angry Anxiety Level: None Thought Processes: Unable to Assess Judgement: Unable to Assess Orientation: Person, Place, Situation, Time, Appropriate for developmental age Obsessive Compulsive Thoughts/Behaviors: Unable to Assess  Cognitive Functioning Concentration: Unable to  Assess Memory: Unable to Assess IQ: Average Insight: Unable to Assess Impulse Control: Unable to Assess Appetite:  (unknown) Sleep: Unable to Assess Vegetative Symptoms: Unable to Assess     Prior Inpatient Therapy Prior Inpatient Therapy: Yes Prior Therapy Dates: 2005, 2012, 2015, 2016 Prior Therapy Facilty/Provider(s): [facility]  Reason for Treatment: Psychosis  Prior Outpatient Therapy Prior Outpatient Therapy:  (unknown) Does patient have an ACCT team?: No Does patient have Intensive In-House Services?  : No Does patient have [facility] services? : No Does patient have P4CC services?: No  ADL Screening (condition at time of admission) Is the patient deaf or have difficulty hearing?: No Does the patient have difficulty seeing, even when wearing glasses/contacts?: No Does the patient have difficulty concentrating, remembering, or making decisions?: No Does the patient have difficulty dressing or bathing?: No Does the patient have difficulty walking or climbing stairs?: No Weakness of Legs: None Weakness of Arms/Hands: None  Home Assistive Devices/Equipment Home Assistive Devices/Equipment: None  Therapy Consults (therapy consults require a physician order) PT Evaluation Needed: No OT Evalulation Needed: No SLP Evaluation Needed: No Abuse/Neglect Assessment (Assessment to be complete while patient is alone) Physical Abuse:  (UTA) Verbal Abuse:  (UTA) Sexual Abuse:  (UTA) Exploitation of patient/patient's resources: Denies Self-Neglect: Denies (Pt appears well kempt and groomed) Values / Beliefs Cultural Requests During Hospitalization: None Spiritual Requests During Hospitalization: None Consults Spiritual Care Consult Needed: No Social Work Consult Needed: No Merchant navy officer (For Healthcare) Does patient have an advance directive?: No Would patient like information on creating an advanced directive?: No - patient declined information    Additional  Information 1:1 In Past 12 Months?: No CIRT Risk:  Yes Elopement Risk: No Does patient have medical clearance?: No     Disposition:  Disposition Initial Assessment Completed for this Encounter: Yes Disposition of Patient: Inpatient treatment program (consulted with Fransisca Kaufmann, NP) Type of inpatient treatment program: Adult (TTS to seek placement)  On Site Evaluation by:   Reviewed with Physician:    Laddie Aquas 02/24/2016 2:29 PM

## 2016-02-24 NOTE — ED Notes (Signed)
[facility] Patient was brought to the ED by [employer]. [facility] [facility]. Paper work states, "mood lability, aggression and is a danger to self or others. Will need inpatient stabilization. Patient has been cooperative with [employer]. Patient is 7 months pregnant. OB/GYN rapid response notified that the patient is here.

## 2016-02-24 NOTE — ED Notes (Signed)
Bed: [facility]4 Expected date:  Expected time:  Means of arrival:  Comments: IVC-pregnant

## 2016-02-24 NOTE — ED Notes (Signed)
Patient states she is suicidal and homicidal. Patient states no plan to harm herself "I just want to die." Patient states she wants "to kill anybody who gets in her way"  Patient denies any alcohol or drug use..Marland Kitchen

## 2016-02-24 NOTE — ED Notes (Signed)
Patient ambulated back from triage.  She has been on the phone several times.  Tearful at times.  States she in homeless and on probation.  [place] to live in [place] but is unable to because of the probation.  She has been up to the dayroom some.  Currently lying on bed watching television.

## 2016-02-24 NOTE — ED Notes (Signed)
Patient changed into burgundy scrubs and wanded by security. Belongings bag x 2.

## 2016-02-24 NOTE — Progress Notes (Addendum)
Patient has been referred to: Alvia GroveBrynn Marr - per [facility], accepting referrals. [place] - per Lamar LaundrySonya, accepting referrals. [facility] - per [place], will review it. Leonette MonarchGaston - per [facility], accepting [facility]errals. [facility] - accepting referrals. [facility] - send referral for the waitlist. [place]   At capacity: Earlene Plateravis - per Domenic SchwabJane Forsyth - per [facility]  Catia [place], [place] Disposition staff 02/24/2016 9:48 PM

## 2016-02-25 DIAGNOSIS — R45851 Suicidal ideations: Secondary | ICD-10-CM | POA: Diagnosis not present

## 2016-02-25 DIAGNOSIS — F1220 Cannabis dependence, uncomplicated: Secondary | ICD-10-CM

## 2016-02-25 DIAGNOSIS — F250 Schizoaffective disorder, bipolar type: Secondary | ICD-10-CM

## 2016-02-25 DIAGNOSIS — Z331 Pregnant state, incidental: Secondary | ICD-10-CM | POA: Diagnosis not present

## 2016-02-25 LAB — GLUCOSE TOLERANCE, 1 HOUR: GLUCOSE 1 HOUR GTT: 149 mg/dL — AB (ref 70–140)

## 2016-02-25 LAB — HEPATITIS B SURFACE ANTIGEN: HEP B S AG: NEGATIVE

## 2016-02-25 LAB — GLUCOSE, FASTING GESTATIONAL: GLUCOSE, FASTING-GESTATIONAL: 97 mg/dL

## 2016-02-25 LAB — ABO/RH: ABO/RH(D): O POS

## 2016-02-25 LAB — RPR: RPR Ser Ql: NONREACTIVE

## 2016-02-25 LAB — HIV ANTIBODY (ROUTINE TESTING W REFLEX): HIV SCREEN 4TH GENERATION: NONREACTIVE

## 2016-02-25 MED ORDER — MENTHOL 3 MG MT LOZG
1.0000 | LOZENGE | OROMUCOSAL | Status: DC | PRN
  Filled 2016-02-25: qty 9

## 2016-02-25 MED ORDER — OLANZAPINE 5 MG PO TBDP
5.0000 mg | ORAL_TABLET | Freq: Two times a day (BID) | ORAL | Status: DC
  Administered 2016-02-25 – 2016-02-28 (×6): 5 mg via ORAL
  Filled 2016-02-25 (×6): qty 1

## 2016-02-25 MED ORDER — OLANZAPINE 10 MG PO TBDP: 10.0000 mg | ORAL_TABLET | Freq: Three times a day (TID) | ORAL | Status: DC | PRN

## 2016-02-25 MED ORDER — PRENATAL MULTIVITAMIN CH
1.0000 | ORAL_TABLET | Freq: Every day | ORAL | Status: DC
  Administered 2016-02-25 – 2016-02-28 (×4): 1 via ORAL
  Filled 2016-02-25 (×4): qty 1

## 2016-02-25 NOTE — Progress Notes (Signed)
CSW assisting in attempts to locate placement for patient that assist with pregnancy and detox. CSW began completing the referral process with patient for [facility], [place].  Elenore PaddyLaVonia Misael Mcgaha, LCSWA [phone] ED CSW 02/25/2016 2:49 PM

## 2016-02-25 NOTE — ED Notes (Signed)
This nurse called [facility] and informed them this nurse had glucocrush in hand for glucose tolerance test. Transferred to phlebotomist Vicky- this nurse informed her that glucocrush in hand and needing  Fasting glucose . Pt reports she ate 2 crackers and 1 juice at 11pm last night.

## 2016-02-25 NOTE — Progress Notes (Signed)
ED CM received a call from officer Penne LashLeggett He states pt is in mental health court system

## 2016-02-25 NOTE — Progress Notes (Signed)
[facility] ED CM spoke with [employer] to assist with getting collateral about possible [place] probational officer

## 2016-02-25 NOTE — Progress Notes (Signed)
ED CM and SAPPU members conference with officers Emelda FearFerguson (case manager) and Penne LashLeggett Pt is scheduled for a mental health court date that may  Officers Preference [place] or [place]-  SAPPU team shared with them the difficulty in getting pt placement [facility]  Probational concern: It would take a 45 day process  for transfer to go out to another state plus would be difficult to get transferWould have complete legal forms and "present her to the judge" Officers Confirmed Charge: assault with deadly weapon Court date 02/26/16 discussed and NP informed officers [facility] but we will keep them posted Officers appreciative of being notified that pt will not make the court date  He will come to bring his business card to [facility]--Cm encouraged him to come through ED registration, ask for ED CM, SW or inform staff he is here related pt and someone will meet him   [facility]

## 2016-02-25 NOTE — Progress Notes (Signed)
Met with ED registration to confirm at this time pt is UNINSURED Verified no medicaid [place] access nor medicaid of [place] not verifiable

## 2016-02-25 NOTE — ED Notes (Signed)
Vicky at bedside to obtain baseline fasting blood sugar.

## 2016-02-25 NOTE — ED Notes (Signed)
Phlebotomist at bedside.

## 2016-02-25 NOTE — Progress Notes (Signed)
Pt discussed in SAPPU progression   1304 Cm spoke with [place] at [facility] [phone] to confirm pt is NOT an active [facility] client Pt was last seen by a provider on 07/18/15 The office made attempt to reinstate pt x 3 but unable to do so because of Noncompliance and No insurance coverage indicated Pt discharged from the program on 08/23/2015

## 2016-02-25 NOTE — Consult Note (Signed)
[facility] Consult   Reason for Consult:  Suicidal ideation, Homelessness Referring Physician:  EDP Patient Identification: Makayla Medina MRN:  [id number] Principal Diagnosis: Schizoaffective disorder, bipolar type [facility]) Diagnosis:   Patient Active Problem List   Diagnosis Date Noted  . Cannabis use disorder, moderate, dependence ([place]) [F12.20] 07/23/2015    Priority: High  . Schizoaffective disorder, bipolar type ([place]) [F25.0] 11/24/2015    Priority: Medium  . First trimester pregnancy [Z33.1] 11/24/2015  . Supervision of high-risk pregnancy [O09.90] 10/08/2015  . Hyperprolactinemia ([place]) [E22.1] 07/25/2015  . Cocaine use disorder, severe, dependence ([place]) [F14.20] 07/22/2015    Total Time spent with patient: 45 minutes  Subjective:   Makayla Medina is a 30 y.o. female patient admitted with Suicidal ideation.  HPI:  AA female, 30 years old was evaluated for suicidal ideation wanting to kill herself because she is pregnant and homeless.  She was discharged from the ER here last week and her housing arrangement did not materialize.  Patient states that the older man who took her in wanted her to do things she did not  want to do so he kicked hoer out.  She also stated that the environment is known for drug dealers and users.  Patient was tearful during the interview and reported feeling hopeless and helpless.   She is scared of having her baby and living in the street.  Patient want to move in with her father in [place] but cannot because she is on probation.  Patient denies SI/HI/AVH today and reports anxiety in reference to where to stay.  Past Psychiatric History:   Schizoaffective disorder, Bipolar type  Risk to Self:   Risk to Others:   Prior Inpatient Therapy:   Prior Outpatient Therapy:    Past Medical History:  Past Medical History  Diagnosis Date  . Anxiety   . Bipolar disorder ([place])   . Vaginal Pap smear, abnormal   . Gonorrhea   . Chlamydia   .  Schizophrenia [facility])     Past Surgical History  Procedure Laterality Date  . Dilation and curettage of uterus    . Colposcopy     Family History:  Family History  Problem Relation Age of Onset  . Alcoholism Mother    Family Psychiatric  History:  Unknown Social History:  History  Alcohol Use No    Comment: socially     History  Drug Use  . Yes  . Special: Marijuana, Cocaine    Comment: 3 days ago. Uses once a week.    Social History   Social History  . Marital Status: Single    Spouse Name: N/A  . Number of Children: N/A  . Years of Education: N/A   Social History Main Topics  . Smoking status: Former Smoker -- 0.25 packs/day    Types: Cigarettes  . Smokeless tobacco: None  . Alcohol Use: No     Comment: socially  . Drug Use: Yes    Special: Marijuana, Cocaine     Comment: 3 days ago. Uses once a week.  Marland Kitchen Sexual Activity: Not Currently     Comment: pt will niot answer   Other Topics Concern  . None   Social History Narrative   Additional Social History:    Allergies:  No Known Allergies  Labs:  Results for orders placed or performed during the hospital encounter of 02/24/16 (from the past 48 hour(s))  Urine rapid drug screen (hosp performed)not [facility]     Status: Abnormal  Collection Time: 02/24/16  3:45 PM  Result Value Ref Range   Opiates NONE DETECTED NONE DETECTED   Cocaine NONE DETECTED NONE DETECTED   Benzodiazepines NONE DETECTED NONE DETECTED   Amphetamines NONE DETECTED NONE DETECTED   Tetrahydrocannabinol POSITIVE (A) NONE DETECTED   Barbiturates NONE DETECTED NONE DETECTED    Comment:        DRUG SCREEN FOR MEDICAL PURPOSES ONLY.  IF CONFIRMATION IS NEEDED FOR ANY PURPOSE, NOTIFY LAB WITHIN 5 DAYS.        LOWEST DETECTABLE LIMITS FOR URINE DRUG SCREEN Drug Class       Cutoff (ng/mL) Amphetamine      1000 Barbiturate      200 Benzodiazepine   818 Tricyclics       563 Opiates          300 Cocaine          300 THC               50   Comprehensive metabolic panel     Status: Abnormal   Collection Time: 02/24/16  3:58 PM  Result Value Ref Range   Sodium 135 135 - 145 mmol/L   Potassium 3.8 3.5 - 5.1 mmol/L   Chloride 103 101 - 111 mmol/L   CO2 24 22 - 32 mmol/L   Glucose, Bld 99 65 - 99 mg/dL   BUN 6 6 - 20 mg/dL   Creatinine, Ser 0.59 0.44 - 1.00 mg/dL   Calcium 8.9 8.9 - 10.3 mg/dL   Total Protein 6.7 6.5 - 8.1 g/dL   Albumin 3.1 (L) 3.5 - 5.0 g/dL   AST 17 15 - 41 U/L   ALT 12 (L) 14 - 54 U/L   Alkaline Phosphatase 89 38 - 126 U/L   Total Bilirubin 0.4 0.3 - 1.2 mg/dL   GFR calc non Af Amer >60 >60 mL/min   GFR calc Af Amer >60 >60 mL/min    Comment: (NOTE) The eGFR has been calculated using the CKD EPI equation. This calculation has not been validated in all clinical situations. eGFR's persistently <60 mL/min signify possible Chronic Kidney Disease.    Anion gap 8 5 - 15  Ethanol     Status: None   Collection Time: 02/24/16  3:58 PM  Result Value Ref Range   Alcohol, Ethyl (B) <5 <5 mg/dL    Comment:        LOWEST DETECTABLE LIMIT FOR SERUM ALCOHOL IS 5 mg/dL FOR MEDICAL PURPOSES ONLY   CBC with Diff     Status: Abnormal   Collection Time: 02/24/16  3:58 PM  Result Value Ref Range   WBC 9.5 4.0 - 10.5 K/uL   RBC 3.80 (L) 3.87 - 5.11 MIL/uL   Hemoglobin 11.4 (L) 12.0 - 15.0 g/dL   HCT 32.9 (L) 36.0 - 46.0 %   MCV 86.6 78.0 - 100.0 fL   MCH 30.0 26.0 - 34.0 pg   MCHC 34.7 30.0 - 36.0 g/dL   RDW 12.6 11.5 - 15.5 %   Platelets 152 150 - 400 K/uL   Neutrophils Relative % 75 %   Neutro Abs 7.2 1.7 - 7.7 K/uL   Lymphocytes Relative 17 %   Lymphs Abs 1.6 0.7 - 4.0 K/uL   Monocytes Relative 7 %   Monocytes Absolute 0.7 0.1 - 1.0 K/uL   Eosinophils Relative 1 %   Eosinophils Absolute 0.1 0.0 - 0.7 K/uL   Basophils Relative 0 %   Basophils Absolute 0.0  0.0 - 0.1 K/uL  Type and screen [place]     Status: None   Collection Time: 02/24/16  3:58 PM  Result Value Ref  Range   ABO/RH(D) O POS    Antibody Screen NEG    Sample Expiration 02/27/2016   RPR     Status: None   Collection Time: 02/24/16  3:58 PM  Result Value Ref Range   RPR Ser Ql Non Reactive Non Reactive    Comment: (NOTE) Performed At: [facility] [place], [place] [id number] Lindon Romp MD FB:[id number]   Hepatitis B surface antigen     Status: None   Collection Time: 02/24/16  3:58 PM  Result Value Ref Range   Hepatitis B Surface Ag Negative Negative    Comment: (NOTE) Performed At: [facility] [address] [place], [place] [id number] Lindon Romp MD [id number]   HIV antibody     Status: None   Collection Time: 02/24/16  3:58 PM  Result Value Ref Range   HIV Screen 4th Generation wRfx Non Reactive Non Reactive    Comment: (NOTE) Performed At: [facility] [address] [place], [place] [id number] Lindon Romp MD [id number]   ABO/Rh     Status: None   Collection Time: 02/24/16  3:58 PM  Result Value Ref Range   ABO/RH(D) O POS   Glucose, fasting gestational     Status: None   Collection Time: 02/25/16  8:00 AM  Result Value Ref Range   Glucose, Fasting-Gestational 97 mg/dL  Glucose tolerance, 1 hour     Status: Abnormal   Collection Time: 02/25/16  9:15 AM  Result Value Ref Range   Glucose, 1 Hour GTT 149 (H) 70 - 140 mg/dL    Current Facility-Administered Medications  Medication Dose Route Frequency Provider Last Rate Last Dose  . acetaminophen (TYLENOL) tablet 650 mg  650 mg Oral Q4H PRN Merrily Pew, MD      . alum & mag hydroxide-simeth (MAALOX/MYLANTA) 200-200-20 MG/5ML suspension 30 mL  30 mL Oral PRN Merrily Pew, MD      . haloperidol lactate (HALDOL) injection 5 mg  5 mg Intramuscular Q6H PRN Merrily Pew, MD   5 mg at 02/24/16 1906  . ondansetron (ZOFRAN) tablet 4 mg  4 mg Oral Q8H PRN Merrily Pew, MD      . prenatal multivitamin tablet 1 tablet  1 tablet Oral Q1200 Delfin Gant, NP   1  tablet at 02/25/16 1405   Current Outpatient Prescriptions  Medication Sig Dispense Refill  . OLANZapine zydis (ZYPREXA) 10 MG disintegrating tablet Take 1 tablet (10 mg total) by mouth at bedtime. 30 tablet 0  . omega-3 acid ethyl esters (LOVAZA) 1 g capsule Take 1 g by mouth daily.    . Prenatal Vit-Fe Fumarate-FA (PRENATAL MULTIVITAMIN) TABS tablet Take 1 tablet by mouth daily at 12 noon.    . vitamin B-12 (CYANOCOBALAMIN) 1000 MCG tablet Take 1,000 mcg by mouth daily.    . cephALEXin (KEFLEX) 500 MG capsule Take 1 capsule (500 mg total) by mouth every 8 (eight) hours. (Patient not taking: Reported on 02/24/2016) 5 capsule 0  . metroNIDAZOLE (FLAGYL) 500 MG tablet Take 1 tablet (500 mg total) by mouth 2 (two) times daily. (Patient not taking: Reported on 02/24/2016) 14 tablet 0  . neomycin-bacitracin-polymyxin (NEOSPORIN) OINT Apply 1 application topically as needed for wound care. (Patient not taking: Reported on 02/08/2016) 1 Tube 0    Musculoskeletal:  Strength & Muscle Tone: within normal limits Gait & Station: normal Patient leans: N/A  Psychiatric Specialty Exam: Review of Systems  Constitutional: Negative.   HENT: Negative.   Eyes: Negative.   Gastrointestinal: Negative.   Genitourinary: Negative.   Musculoskeletal: Negative.   Skin: Negative.   Neurological: Negative.   Endo/Heme/Allergies: Negative.   Pregnant 7 months  Blood pressure 101/65, pulse 80, temperature 97.6 F (36.4 C), temperature source Oral, resp. rate 16, SpO2 100 %, currently breastfeeding.There is no weight on file to calculate BMI.  General Appearance: Casual and Fairly Groomed  Eye Contact::  Good  Speech:  Clear and Coherent and Normal Rate  Volume:  Normal  Mood:  Angry, Anxious, Depressed and Hopeless  Affect:  Congruent  Thought Process:  Coherent, Goal Directed and Intact  Orientation:  Full (Time, Place, and Person)  Thought Content:  WDL  Suicidal Thoughts:  No  Homicidal Thoughts:  No   Memory:  Immediate;   Good Recent;   Good Remote;   Good  Judgement:  Fair  Insight:  Good  Psychomotor Activity:  Normal  Concentration:  Good  Recall:  Good  Fund of Knowledge:Good  Language: Good  Akathisia:  No  Handed:  Right  AIMS (if indicated):     Assets:  Desire for Improvement Financial Resources/Insurance Housing  ADL's:  Intact  Cognition: WNL  Sleep:      Treatment Plan Summary: Daily contact with patient to assess and evaluate symptoms and progress in treatment and Medication management  Disposition: Accepted for admission and we will be seeking placement at any facility with available bed.   We will resume her home medications.  Delfin Gant, NP   PMHNP-BC 02/25/2016 2:59 PM Patient seen face-to-face for psychiatric evaluation, chart reviewed and case discussed with the physician extender and developed treatment plan. Reviewed the information documented and agree with the treatment plan. Corena Pilgrim, MD

## 2016-02-25 NOTE — Progress Notes (Addendum)
ED Cm spoke with pt about her probational officer Pt states it is a female Makayla Medina (? Spelling of last name) Pt states # [phone] or  [phone] (last number obtained by [employer]) Pt states she was told by Probational officer she "could not go out of the state only. I have tried to get to places like [place]."  Pt give permission for probational officer to be contacted to verify this Pt reports having an upcoming court date  1355 ED CM spoke with Mr Medina's supervisor, Jasmine DecemberSharon who confirm for pt can not go out of state but can go to any county [place]. Mr legget in in drug tx court at this time but left CM mobile and SW mobile for a return call if this is found not to be correct for pt   Pt will only have to have probation [place] if her treatment plan will be long term - more than a month Jasmine DecemberSharon confirmed pt has a court date for "April sixith"  (02/26/16)

## 2016-02-25 NOTE — Progress Notes (Signed)
Received a call from Will Ferguson and Arrow Electronicsfficer Leggett who inquired if pt could go [facility] Informed them pt is not at this time meeting criteria for admission to either (pregnancy, acuity)

## 2016-02-25 NOTE — ED Notes (Signed)
Pt given glucocrush and finished drinking at 0810. This nurse called lab, spoke with Huntley DecSara, and informed pt had finished the drink at 0810, reports phlebotomy will be at bedside at 0910

## 2016-02-25 NOTE — ED Notes (Signed)
This nurse called phlebotomist, [facility] with [facility]- awaiting lab draw

## 2016-02-25 NOTE — ED Notes (Signed)
Pt labile. Pt reports SI. Pt reports she is pregnant and homeless. Reporting she has nothing together for when the baby comes. Pt forwards little with this nurse, irritable at times. Will continue to monitor on special checks q 15 mins for safety,

## 2016-02-25 NOTE — ED Notes (Signed)
Patient received calm and resting in bed. Patient denies SI/ HI, A/ V H, depression, anxiety at this time. Patient contracts for safety, q 15 min checks continue.

## 2016-02-25 NOTE — ED Notes (Signed)
Glucola for Glucose test is not found in pharmacy and or lab. Will continue to search for glucola.

## 2016-02-25 NOTE — ED Notes (Signed)
Contacted cafeteria for glucola and [facility] for glucola still unable to find out where glucola comes from.

## 2016-02-26 DIAGNOSIS — Z3490 Encounter for supervision of normal pregnancy, unspecified, unspecified trimester: Secondary | ICD-10-CM | POA: Insufficient documentation

## 2016-02-26 DIAGNOSIS — Z331 Pregnant state, incidental: Secondary | ICD-10-CM

## 2016-02-26 LAB — RUBELLA SCREEN: RUBELLA: 6.13 {index} (ref >0.99)

## 2016-02-26 NOTE — Progress Notes (Signed)
2035  Dr.  FullingHarraway-Smith updated on patient and notified of timing for 3 hour glucose test to be completed in AM.

## 2016-02-26 NOTE — BH Assessment (Signed)
[facility] Assessment Progress Note  Per Thedore MinsMojeed Akintayo, MD, this pt requires psychiatric hospitalization.  [facility].  At 13:33 Kerry DoryLaurie Gardner calls back to report that she has received referral information.  Decision is pending at this time.  Doylene Canninghomas Linsie Lupo, MA Triage Specialist [phone]

## 2016-02-26 NOTE — Consult Note (Signed)
Psychiatric Specialty Exam: Physical Exam  ROS  Blood pressure 96/63, pulse 71, temperature 98.6 F (37 C), temperature source Oral, resp. rate 18, SpO2 98 %, currently breastfeeding.There is no weight on file to calculate BMI.  General Appearance: Casual and Fairly Groomed  Eye Contact:: Good  Speech: Clear and Coherent and Normal Rate  Volume: Normal  Mood: Angry, Anxious, Depressed and Hopeless  Affect: Congruent  Thought Process: Coherent, Goal Directed and Intact  Orientation: Full (Time, Place, and Person)  Thought Content: WDL  Suicidal Thoughts: No  Homicidal Thoughts: No  Memory: Immediate; Good Recent; Good Remote; Good  Judgement: Fair  Insight: Good  Psychomotor Activity: Normal  Concentration: Good  Recall: Good  Fund of Knowledge:Good  Language: Good  Akathisia: No  Handed: Right  AIMS (if indicated):    Assets: Desire for Improvement Financial Resources/Insurance Housing  ADL's: Intact  Cognition: WNL         Patient was seen in her room calm and cooperative.  She reports good sleep and appetite.  She is not suicidal today but is worried about going out in the community without a place to stay and 7 months pregnant. Patient is trying to stay away from the environment riddled with drugs.  Patient is compliant with her medications today.  She denies SI/HI/AVH but states she does not know what she will do when she leaves this place without a place to stay. Schizoaffective disorder, bipolar type (H[facility])   Plan:  Seek placement, monitor and offer medication as prescribed.  Makayla ByesJosephine Onuoha   PMHNP-BC Patient seen face-to-face for psychiatric evaluation, chart reviewed and case discussed with the physician extender and developed treatment plan. Reviewed the information documented and agree with the treatment plan. Makayla MinsMojeed Tuyen Uncapher, MD

## 2016-02-26 NOTE — Progress Notes (Addendum)
CM spoke with pt who [place]cp.  CM discussed and provided written information for uninsured accepting pcps, discussed the importance of pcp vs EDP services for f/u care, www.needymeds.org, www.goodrx.com, discounted pharmacies and other [place] resources such as [facility] , [facility], affordable care act, financial assistance, uninsured dental services, [place] med assist, DSS and  health department  Reviewed resources for [place] uninsured accepting pcps like [facility] [place], family medicine at [address], [facility] [place], [facility], local urgent care centers, [facility], [facility] family practice, general medical clinics, family services of the [facility], [facility] urgent care plus others, medication resources, [facility] out patient pharmacies and housing Pt voiced understanding and appreciation of resources provided   Provided [facility] contact information   Entered in d/c instructions  Please use the resources provided to you in emergency room by case manager to assist with doctor for follow up These [place] uninsured resources provide possible primary care providers, resources for discounted medications, housing, dental resources, affordable care act information, plus other resources for [place]

## 2016-02-26 NOTE — ED Notes (Signed)
Patient has been quiet and cooperative thus far this shift.  Her speech is not pressured and she was cooperative with Dr. Jannifer FranklinAkintayo when he was in to see her.  It was reported to me that she slept well last night.

## 2016-02-26 NOTE — ED Notes (Signed)
Received call from OB rapid response, patient failed her one hour glucose tolerance test and will need a 3 hour test.  I explained this to the patient and explained that the OB nurse would be over later today to explain the test further to her and to answer any questions she may have.  Patient verbalized understanding.  She has been quiet today and has stayed in her room most of the day.

## 2016-02-26 NOTE — ED Notes (Signed)
Pt received calm and resting in bed.  Patient denies current SI, HI, A/ VH, depression, anxiety and pain. Pt contracts for safety. q 15 min checks continue.

## 2016-02-26 NOTE — ED Notes (Signed)
Pt NPO ( water only) until morning for glucose tolerance test.

## 2016-02-27 LAB — GLUCOSE, FASTING GESTATIONAL: Glucose Tolerance, Fasting: 94 mg/dL

## 2016-02-27 LAB — GLUCOSE, 1 HOUR GESTATIONAL: Glucose Tolerance, 1 hour: 164 mg/dL (ref 70–189)

## 2016-02-27 LAB — GLUCOSE, 3 HOUR GESTATIONAL: Glucose, GTT - 3 Hour: 110 mg/dL (ref 70–144)

## 2016-02-27 LAB — GLUCOSE, 2 HOUR GESTATIONAL: Glucose Tolerance, 2 hour: 119 mg/dL (ref 70–164)

## 2016-02-27 NOTE — BH Assessment (Signed)
BHH Assessment Progress Note  Pt requests that her application for admission [facility] be revised and re-submitted.  This Clinical research associatewriter facilitated the revisions with the pt.  I then faxed the form to [facility] at [phone].  At 16:48 Beatriz Stallionanya Dixon at [facility] confirms receipt.  Revised form has been left for Child psychotherapistsocial worker.  A copy has been included in pt's paper chart.  Doylene Canninghomas Kessa Fairbairn, MA Triage Specialist [phone]

## 2016-02-27 NOTE — ED Notes (Signed)
Patient appears flat, withdrawn, drowsy. Denies SI, HI, AVH. Depressed. Reports heartache.  Encouragement offered.   Q15 safety checks continue.

## 2016-02-27 NOTE — ED Notes (Signed)
1st draw complete, glucose soln consumed

## 2016-02-27 NOTE — Progress Notes (Addendum)
CSW faxed [facility], [phone], to Ernest Mallickia Hawkins. Confirmation page received with "ok" status.  CSW received telephone [facility]. She stated she needed more information on patient. She provided names and contact numbers for herself and Ernest Mallickia Hawkins for patient to call and give more information with the contact number of [phone].  Elenore PaddyLaVonia Bastien Strawser, LCSWA [phone] ED CSW 02/27/2016 12:39 PM

## 2016-02-27 NOTE — BH Assessment (Signed)
Patient was reassessed by TTS.   Patient states that she is "doing good." Patient denies SI and HI at this time. Patient denies AVH and states that she is sleeping "like a baby." patient is taking her medications as prescribed but reports that she is concerned with taking Zyprexa due to being pregnant. Patient states "I'm trying to learn how to control my thoughts without medicine, at least for the duration of this pregnancy." Patient states that it is difficult at times for her to "control" her thoughts. Patient expressed that she does not feel that she will get into [facility]. Patient states that she does not have a birth certificate. Informed Psychiatrist and patient nurse about medication concerns. Informed CSW about patients concerns [facility].    Consulted with Dr. Jannifer FranklinAkintayo and Nanine MeansJamison Lord, PMH-NP who continues to recommend inpatient treatment.   Davina PokeJoVea Anita Mcadory, LCSW Therapeutic Triage Specialist [facility] 02/27/2016 10:12 AM

## 2016-02-27 NOTE — Progress Notes (Signed)
Called Dr leggett informed of pt 3hr gtt results and daily doppler. No new orders received.

## 2016-02-27 NOTE — ED Notes (Signed)
Pt's affect is blunted and her room is depressed. She stays in her bed with her head under the covers. She took a shower this morning.

## 2016-02-28 DIAGNOSIS — F250 Schizoaffective disorder, bipolar type: Secondary | ICD-10-CM | POA: Diagnosis not present

## 2016-02-28 MED ORDER — PRENATAL MULTIVITAMIN CH: 1.0000 | ORAL_TABLET | Freq: Every day | ORAL | Status: AC

## 2016-02-28 MED ORDER — OLANZAPINE 5 MG PO TABS
5.0000 mg | ORAL_TABLET | Freq: Every day | ORAL | Status: DC
Start: 2016-02-28 — End: 2016-02-28

## 2016-02-28 MED ORDER — OLANZAPINE 5 MG PO TABS: 5.0000 mg | ORAL_TABLET | Freq: Every day | ORAL | Status: DC
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
## 2016-02-28 NOTE — ED Notes (Signed)
Pt has been calm and cooperative. She states that she is ready to leave and wants to leave today. [facility] while she is pregnant. She denies SI/HI, is not delusional and does not appear to be having hallucinations. She was given a bus ticket and all belongings were returned. Pt signed for same.

## 2016-02-28 NOTE — Consult Note (Signed)
[facility] Face-to-Face Psychiatry Consult   Reason for Consult:  Suicidal ideations Referring Physician:  EDP Patient Identification: Makayla Medina MRN:  [id number] Principal Diagnosis: Schizoaffective disorder, bipolar type [facility]) Diagnosis:   Patient Active Problem List   Diagnosis Date Noted  . Schizoaffective disorder, bipolar type (HCC) [F25.0] 11/24/2015    Priority: High  . Cannabis use disorder, moderate, dependence (HCC) [F12.20] 07/23/2015    Priority: High  . Cocaine use disorder, severe, dependence (HCC) [F14.20] 07/22/2015    Priority: High  . Pregnancy [Z33.1]   . Suicidal thoughts [R45.851]   . First trimester pregnancy [Z33.1] 11/24/2015  . Supervision of high-risk pregnancy [O09.90] 10/08/2015  . Hyperprolactinemia (HCC) [E22.1] 07/25/2015    Total Time spent with patient: 30 minutes  Subjective:   Makayla Medina is a 30 y.o. female patient does not warrant admission, stabilized.  HPI:  30 yo female who presented to the ED initially with depression and suicidal ideations, history of substance abuse.  Her medications were adjusted and she stabilized.  Denies suicidal/homicidal ideations, hallucinations, and alcohol/drug abuse.  [facility] was sent yesterday and they are waiting for a face to face interview with her.  Patient is motivated to go and get help to stay clean during her pregnancy.  She also reports having "clean" support to help her after discharge.  Past Psychiatric History: Substance abuse, schizoaffective disorder  Risk to Self:  None Risk to Others:  None Prior Inpatient Therapy:  Yes Prior Outpatient Therapy:  Yes  Past Medical History:  Past Medical History  Diagnosis Date  . Anxiety   . Bipolar disorder (HCC)   . Vaginal Pap smear, abnormal   . Gonorrhea   . Chlamydia   . Schizophrenia [facility])     Past Surgical History  Procedure Laterality Date  . Dilation and curettage of uterus     . Colposcopy     Family History:  Family History  Problem Relation Age of Onset  . Alcoholism Mother    Family Psychiatric  History: None Social History:  History  Alcohol Use No    Comment: socially     History  Drug Use  . Yes  . Special: Marijuana, Cocaine    Comment: 3 days ago. Uses once a week.    Social History   Social History  . Marital Status: Single    Spouse Name: N/A  . Number of Children: N/A  . Years of Education: N/A   Social History Main Topics  . Smoking status: Former Smoker -- 0.25 packs/day    Types: Cigarettes  . Smokeless tobacco: None  . Alcohol Use: No     Comment: socially  . Drug Use: Yes    Special: Marijuana, Cocaine     Comment: 3 days ago. Uses once a week.  Marland Kitchen Sexual Activity: Not Currently     Comment: pt will niot answer   Other Topics Concern  . None   Social History Narrative   Additional Social History:    Allergies:  No Known Allergies  Labs:  Results for orders placed or performed during the hospital encounter of 02/24/16 (from the past 48 hour(s))  Glucose, fasting gestational     Status: None   Collection Time: 02/27/16  5:20 AM  Result Value Ref Range   Glucose, Fasting-Gestational 94 mg/dL  Glucose, 1 hour gestational     Status: None   Collection Time: 02/27/16  6:20 AM  Result Value Ref  Range   Glucose, 1 Hour-Gestational 164 70 - 189 mg/dL  Glucose, 2 hour gestational     Status: None   Collection Time: 02/27/16  7:20 AM  Result Value Ref Range   Glucose, 2 Hour-Gestational 119 70 - 164 mg/dL  Glucose, 3 hour gestational     Status: None   Collection Time: 02/27/16  8:20 AM  Result Value Ref Range   Glucose, GTT - 3 Hour 110 70 - 144 mg/dL    Current Facility-Administered Medications  Medication Dose Route Frequency Provider Last Rate Last Dose  . acetaminophen (TYLENOL) tablet 650 mg  650 mg Oral Q4H PRN Marily MemosJason Mesner, MD      . alum & mag hydroxide-simeth (MAALOX/MYLANTA) 200-200-20 MG/5ML  suspension 30 mL  30 mL Oral PRN Marily MemosJason Mesner, MD      . menthol-cetylpyridinium (CEPACOL) lozenge 3 mg  1 lozenge Oral PRN Arby BarretteMarcy Pfeiffer, MD      . OLANZapine (ZYPREXA) tablet 5 mg  5 mg Oral QHS Krisanne Lich, MD      . OLANZapine zydis (ZYPREXA) disintegrating tablet 10 mg  10 mg Oral Q8H PRN Earney NavyJosephine C Onuoha, NP      . ondansetron (ZOFRAN) tablet 4 mg  4 mg Oral Q8H PRN Marily MemosJason Mesner, MD      . prenatal multivitamin tablet 1 tablet  1 tablet Oral Q1200 Earney NavyJosephine C Onuoha, NP   1 tablet at 02/28/16 1111   Current Outpatient Prescriptions  Medication Sig Dispense Refill  . OLANZapine zydis (ZYPREXA) 10 MG disintegrating tablet Take 1 tablet (10 mg total) by mouth at bedtime. 30 tablet 0  . omega-3 acid ethyl esters (LOVAZA) 1 g capsule Take 1 g by mouth daily.    . Prenatal Vit-Fe Fumarate-FA (PRENATAL MULTIVITAMIN) TABS tablet Take 1 tablet by mouth daily at 12 noon.    . vitamin B-12 (CYANOCOBALAMIN) 1000 MCG tablet Take 1,000 mcg by mouth daily.    . cephALEXin (KEFLEX) 500 MG capsule Take 1 capsule (500 mg total) by mouth every 8 (eight) hours. (Patient not taking: Reported on 02/24/2016) 5 capsule 0  . metroNIDAZOLE (FLAGYL) 500 MG tablet Take 1 tablet (500 mg total) by mouth 2 (two) times daily. (Patient not taking: Reported on 02/24/2016) 14 tablet 0  . neomycin-bacitracin-polymyxin (NEOSPORIN) OINT Apply 1 application topically as needed for wound care. (Patient not taking: Reported on 02/08/2016) 1 Tube 0  . OLANZapine (ZYPREXA) 5 MG tablet Take 1 tablet (5 mg total) by mouth at bedtime. 30 tablet 0  . Prenatal Vit-Fe Fumarate-FA (PRENATAL MULTIVITAMIN) TABS tablet Take 1 tablet by mouth daily at 12 noon. 30 tablet 0    Musculoskeletal: Strength & Muscle Tone: within normal limits Gait & Station: normal Patient leans: N/A  Psychiatric Specialty Exam: Review of Systems  Constitutional: Negative.   HENT: Negative.   Eyes: Negative.   Respiratory: Negative.   Cardiovascular:  Negative.   Gastrointestinal: Negative.   Genitourinary: Negative.   Musculoskeletal: Negative.   Skin: Negative.   Neurological: Negative.   Endo/Heme/Allergies: Negative.   Psychiatric/Behavioral: Negative.     Blood pressure 118/66, pulse 77, temperature 97.8 F (36.6 C), temperature source Oral, resp. rate 18, SpO2 100 %, currently breastfeeding.There is no weight on file to calculate BMI.  General Appearance: Casual  Eye Contact::  Good  Speech:  Normal Rate  Volume:  Normal  Mood:  Euthymic  Affect:  Congruent  Thought Process:  Coherent  Orientation:  Full (Time, Place, and Person)  Thought Content:  WDL  Suicidal Thoughts:  No  Homicidal Thoughts:  No  Memory:  Immediate;   Good Recent;   Good Remote;   Good  Judgement:  Fair  Insight:  Fair  Psychomotor Activity:  Normal  Concentration:  Good  Recall:  Good  Fund of Knowledge:Good  Language: Good  Akathisia:  No  Handed:  Right  AIMS (if indicated):     Assets:  Leisure Time Physical Health Resilience Social Support  ADL's:  Intact  Cognition: WNL  Sleep:      Treatment Plan Summary: Daily contact with patient to assess and evaluate symptoms and progress in treatment, Medication management and Plan schizoaffective disorder, depressive type:  -Crisis stabilization -Medication management:  Continue her prenatal vitamins and Zyprexa 5 mg at bedtime for mood stabilization -Individual counseling -Rx provided with a bus pass and resources for follow-up  Disposition: No evidence of imminent risk to self or others at present.    Nanine Means, NP 02/28/2016 11:12 AM Patient seen face-to-face for psychiatric evaluation, chart reviewed and case discussed with the physician extender and developed treatment plan. Reviewed the information documented and agree with the treatment plan. Thedore Mins, MD

## 2016-02-28 NOTE — Discharge Instructions (Signed)
Patient can follow up [facility] and contact the Perinatal Referral line at [phone].   Patient [facility] follow [facility]:  To [facility], please call to discuss our program with a Psychologist, educationalClient Services Staff member.  Call [phone].

## 2016-02-28 NOTE — BH Assessment (Signed)
Dr. Jannifer FranklinAkintayo recommends discharge at this time. TTS provided patient with resources to the CASA line for [facility] for [facility] care service referral. This Clinical research associatewriter also provided patient with information for Room at [facility] if needed. Patient denies any questions or concerns.   Davina PokeJoVea Pier Laux, LCSW Therapeutic Triage Specialist [facility] 02/28/2016 12:32 PM'

## 2016-02-28 NOTE — Clinical Social Work Note (Signed)
CSW spoke with psychiatry and pt will be discharging today.  [facility] is [facility] with [facility] today.  CSW provided rescinding IVC paperwork per psychiatry request.  Forms signed and will be filed in shadow chart.  Elray Buba.Dez Stauffer, LCSW [facility] Clinical Social Worker - Weekend Coverage cell #: [phone]

## 2016-03-07 ENCOUNTER — Encounter (HOSPITAL_COMMUNITY): Payer: Self-pay | Admitting: Certified Nurse Midwife

## 2016-03-07 ENCOUNTER — Inpatient Hospital Stay (HOSPITAL_COMMUNITY)
Admission: AD | Admit: 2016-03-07 | Discharge: 2016-03-07 | Disposition: A | Discharge status: Home / Self Care | Payer: Medicaid Other | Source: Ambulatory Visit | Attending: Obstetrics & Gynecology | Admitting: Obstetrics & Gynecology

## 2016-03-07 ENCOUNTER — Inpatient Hospital Stay (HOSPITAL_COMMUNITY): Payer: Medicaid Other

## 2016-03-07 DIAGNOSIS — Z87891 Personal history of nicotine dependence: Secondary | ICD-10-CM | POA: Insufficient documentation

## 2016-03-07 DIAGNOSIS — N760 Acute vaginitis: Secondary | ICD-10-CM | POA: Diagnosis not present

## 2016-03-07 DIAGNOSIS — Z3A01 Less than 8 weeks gestation of pregnancy: Secondary | ICD-10-CM | POA: Insufficient documentation

## 2016-03-07 DIAGNOSIS — O23593 Infection of other part of genital tract in pregnancy, third trimester: Secondary | ICD-10-CM

## 2016-03-07 DIAGNOSIS — F209 Schizophrenia, unspecified: Secondary | ICD-10-CM | POA: Diagnosis not present

## 2016-03-07 DIAGNOSIS — O99341 Other mental disorders complicating pregnancy, first trimester: Secondary | ICD-10-CM | POA: Insufficient documentation

## 2016-03-07 DIAGNOSIS — F419 Anxiety disorder, unspecified: Secondary | ICD-10-CM | POA: Insufficient documentation

## 2016-03-07 DIAGNOSIS — A499 Bacterial infection, unspecified: Secondary | ICD-10-CM

## 2016-03-07 DIAGNOSIS — O4693 Antepartum hemorrhage, unspecified, third trimester: Secondary | ICD-10-CM

## 2016-03-07 DIAGNOSIS — B9689 Other specified bacterial agents as the cause of diseases classified elsewhere: Secondary | ICD-10-CM

## 2016-03-07 DIAGNOSIS — F319 Bipolar disorder, unspecified: Secondary | ICD-10-CM | POA: Insufficient documentation

## 2016-03-07 DIAGNOSIS — O209 Hemorrhage in early pregnancy, unspecified: Secondary | ICD-10-CM | POA: Diagnosis present

## 2016-03-07 LAB — RAPID URINE DRUG SCREEN, HOSP PERFORMED
Amphetamines: NOT DETECTED
Barbiturates: NOT DETECTED
Benzodiazepines: NOT DETECTED
COCAINE: POSITIVE — AB
OPIATES: NOT DETECTED
Tetrahydrocannabinol: NOT DETECTED

## 2016-03-07 LAB — URINALYSIS, ROUTINE W REFLEX MICROSCOPIC
BILIRUBIN URINE: NEGATIVE
Glucose, UA: NEGATIVE mg/dL
Hgb urine dipstick: NEGATIVE
KETONES UR: 40 mg/dL — AB
NITRITE: NEGATIVE
Protein, ur: NEGATIVE mg/dL
Specific Gravity, Urine: <1.005 — ABNORMAL LOW (ref 1.005–1.030)
pH: 7 (ref 5.0–8.0)

## 2016-03-07 LAB — WET PREP, GENITAL
SPERM: NONE SEEN
Trich, Wet Prep: NONE SEEN
Yeast Wet Prep HPF POC: NONE SEEN

## 2016-03-07 LAB — URINE MICROSCOPIC-ADD ON

## 2016-03-07 MED ORDER — METRONIDAZOLE 500 MG PO TABS: 500.0000 mg | ORAL_TABLET | Freq: Two times a day (BID) | ORAL | Status: AC

## 2016-03-07 NOTE — MAU Note (Signed)
Pt arrives via EMS complaining of cramping and bleeding. Pt crying and upset. No other information was obtained.

## 2016-03-07 NOTE — MAU Note (Signed)
Pt refuses EFM and TOCO monitoring.

## 2016-03-07 NOTE — Discharge Instructions (Signed)

## 2016-03-07 NOTE — MAU Provider Note (Signed)
History   [id number]   Chief Complaint  Patient presents with  . Contractions    HPI Makayla Medina is a 30 y.o. female  [id number] at 2926w1d IUP in custody of police department.  Per officer report, patient was in process of an act of prostitution when the police was called due to her stealing a necklace from the man.  Patient was taken into police custody and while at the station reported that she had blood when she wiped while in bathroom.  No known injury to the abdomen.  Pt reports pain while in bathroom and then saw blood.  Denies contractions.  Pregnancy dated by a 6 week ultrasound.      No LMP recorded (lmp unknown). Patient is pregnant.  OB History  Gravida Para Term Preterm AB SAB TAB Ectopic Multiple Living  4 1 1  2  2   1     # Outcome Date GA Lbr Len/2nd Weight Sex Delivery Anes PTL Lv  4 Current           3 TAB           2 TAB           1 Term               Past Medical History  Diagnosis Date  . Anxiety   . Bipolar disorder (HCC)   . Vaginal Pap smear, abnormal   . Gonorrhea   . Chlamydia   . Schizophrenia (HCC)     Family History  Problem Relation Age of Onset  . Alcoholism Mother     Social History   Social History  . Marital Status: Single    Spouse Name: N/A  . Number of Children: N/A  . Years of Education: N/A   Social History Main Topics  . Smoking status: Former Smoker -- 0.25 packs/day    Types: Cigarettes  . Smokeless tobacco: None  . Alcohol Use: No     Comment: socially  . Drug Use: Yes    Special: Marijuana, Cocaine     Comment: 3 days ago. Uses once a week.  Marland Kitchen. Sexual Activity: Not Currently     Comment: pt will niot answer   Other Topics Concern  . None   Social History Narrative    No Known Allergies  No current facility-administered medications on file prior to encounter.   Current Outpatient Prescriptions on File Prior to Encounter  Medication Sig Dispense Refill  . OLANZapine (ZYPREXA) 5 MG tablet Take 1 tablet (5  mg total) by mouth at bedtime. 30 tablet 0  . Prenatal Vit-Fe Fumarate-FA (PRENATAL MULTIVITAMIN) TABS tablet Take 1 tablet by mouth daily at 12 noon. 30 tablet 0     Review of Systems  Constitutional: Negative for fever and chills.  Gastrointestinal: Negative for nausea, vomiting and abdominal pain.  Genitourinary: Positive for vaginal bleeding and pelvic pain. Negative for vaginal discharge.  All other systems reviewed and are negative.    Physical Exam   Filed Vitals:   03/07/16 1714  BP: 113/71  Pulse: 82  Temp: 97.7 F (36.5 C)  TempSrc: Oral  Resp: 20    Physical Exam  Constitutional: She is oriented to person, place, and time. She appears well-developed and well-nourished.  HENT:  Head: Normocephalic.  Neck: Normal range of motion. Neck supple.  Cardiovascular: Normal rate, regular rhythm and normal heart sounds.   Respiratory: Effort normal and breath sounds normal. No respiratory distress.  GI: Soft. There is  no tenderness.  Genitourinary: Uterus is not tender. No bleeding in the vagina. Vaginal discharge (white discharge; +odor) found.  Musculoskeletal: Normal range of motion. She exhibits no edema.  Neurological: She is alert and oriented to person, place, and time.  Skin: Skin is warm and dry.   FHR 120's, +accel Toco - none  Dilation: Closed Exam by:: [id number] CNM  MAU Course  Procedures  Results for orders placed or performed during the hospital encounter of 03/07/16 (from the past 24 hour(s))  Wet prep, genital     Status: Abnormal   Collection Time: 03/07/16  5:26 PM  Result Value Ref Range   Yeast Wet Prep HPF POC NONE SEEN NONE SEEN   Trich, Wet Prep NONE SEEN NONE SEEN   Clue Cells Wet Prep HPF POC PRESENT (A) NONE SEEN   WBC, Wet Prep HPF POC FEW (A) NONE SEEN   Sperm NONE SEEN    Ultrasound: AFI wnl No note of abnormality of placenta  Assessment and Plan  30 y.o. [id number] at [redacted]w[redacted]d IUP   Bacterial Vaginosis Reactive  NST  Plan: Discharge to custody of police officers RX Flagyl 500 mg BID x 7 days Discharge instructions given to officers  Marlis Edelson, CNM 03/07/2016 6:58 PM

## 2016-03-07 NOTE — MAU Note (Signed)
Ultrasound at the bedside. Pt requests picture of baby. Ultrasound technician gives picture of baby to patient. Pt crumples up the picture and throws it in the trash. Pt requests a Malaw[place]urkey sandwich. Pt informed that we do not have Malawiturkey sandwiches on this unit. Pt given apple juice and crackers.

## 2016-03-08 LAB — GC/CHLAMYDIA PROBE AMP (~~LOC~~) NOT AT ARMC
CHLAMYDIA, DNA PROBE: NEGATIVE
NEISSERIA GONORRHEA: NEGATIVE

## 2016-03-25 ENCOUNTER — Encounter: Payer: Self-pay | Admitting: Family

## 2016-08-12 ENCOUNTER — Encounter (HOSPITAL_COMMUNITY): Payer: Self-pay

## 2016-11-22 IMAGING — US US OB LIMITED
1 series · 14 of 28 positions shown · non-contrast
Comparison: none

CLINICAL DATA: 29-year-old pregnant female found unconscious with
reported history of maternal drug abuse.

EDC by early sonogram: 05/08/2016, projecting to an expected
gestational age 14 weeks 4 days
EXAM:
LIMITED OBSTETRIC ULTRASOUND

[Series 1: us ob limited · 0.18mm/px · 30 acquisitions, 14 frames shown]
[im 2/30]
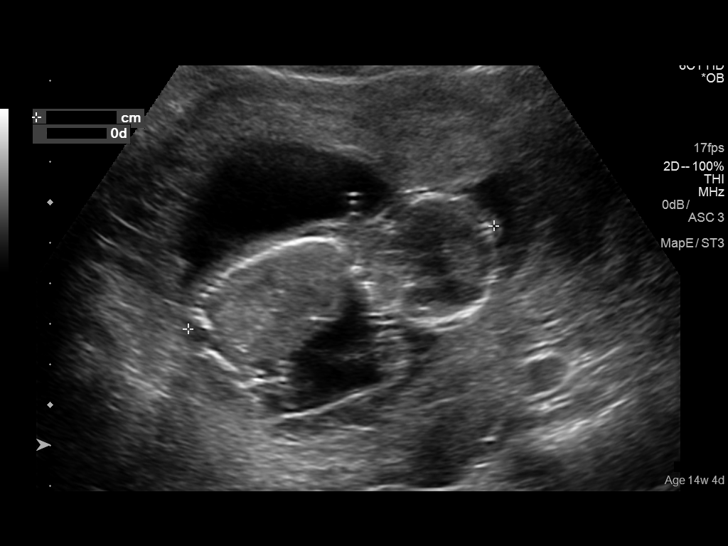
[im 4/30]
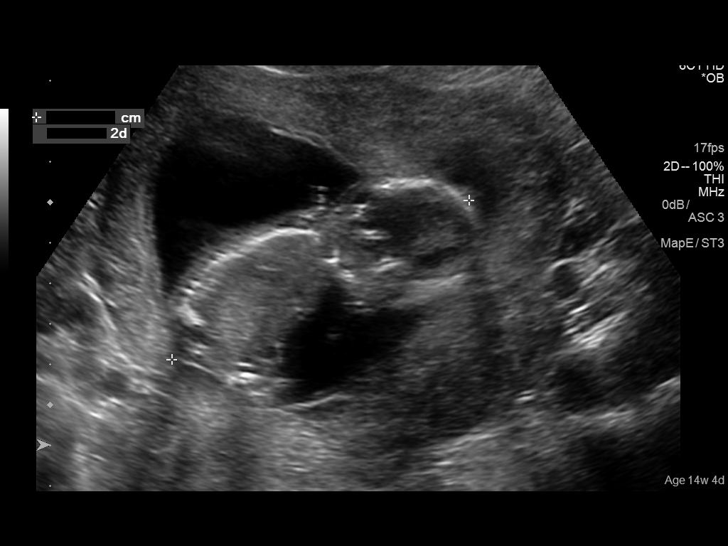
[im 6/30]
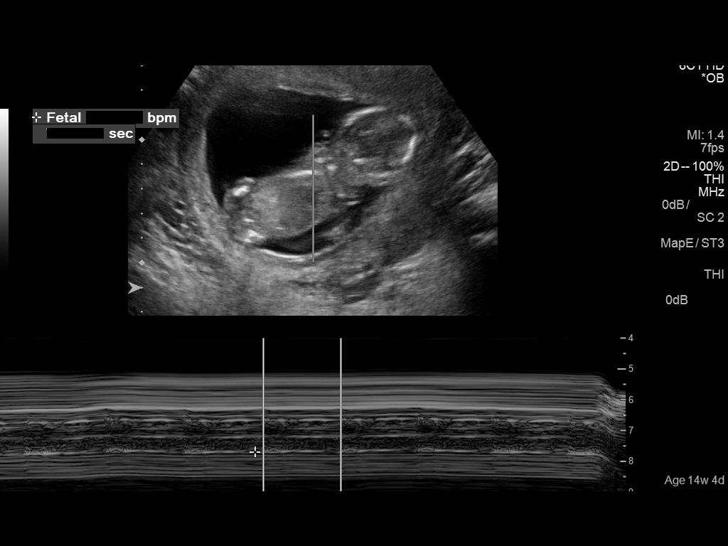
[im 8/30]
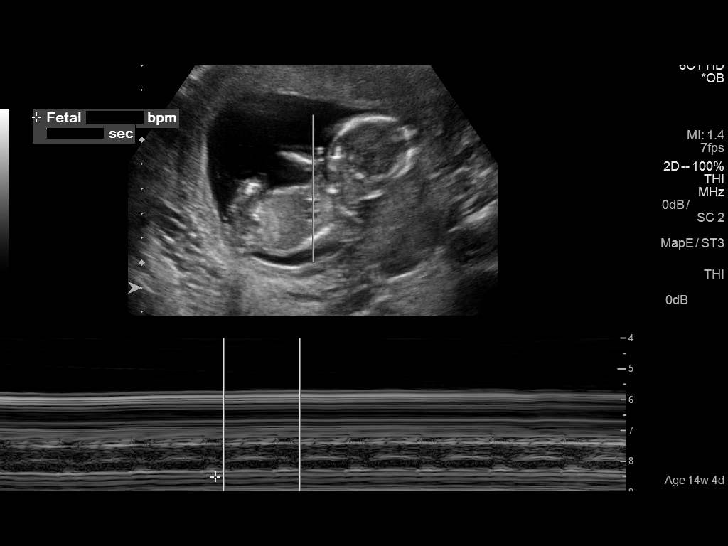
[im 10/30]
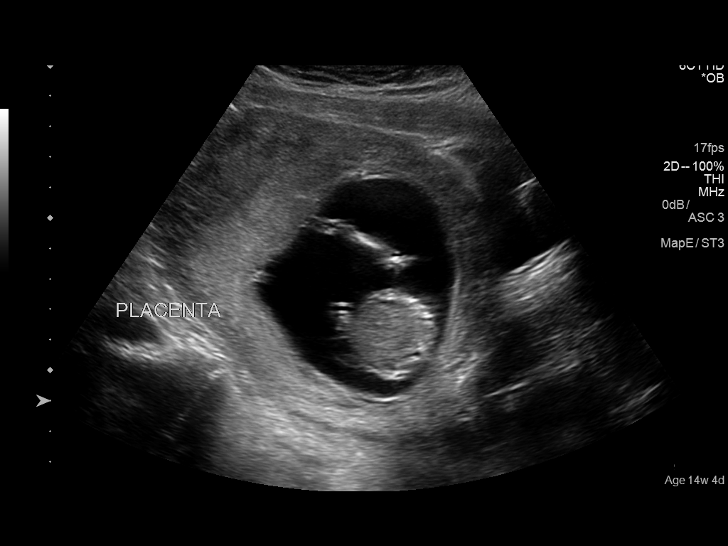
[im 12/30]
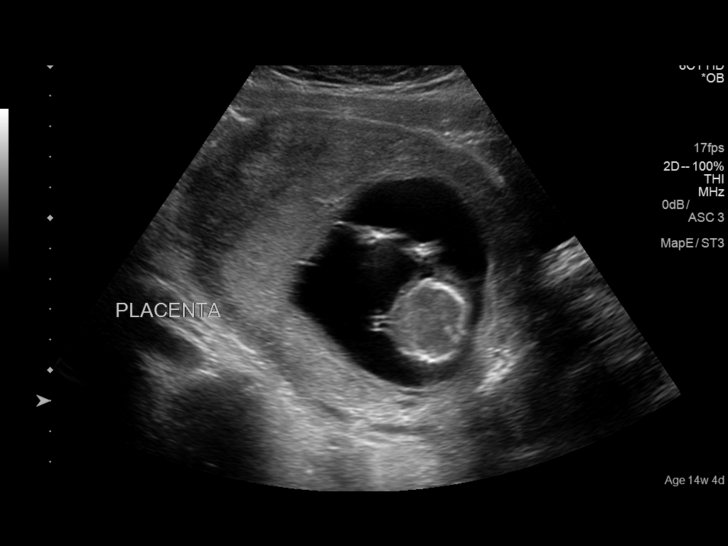
[im 14/30]
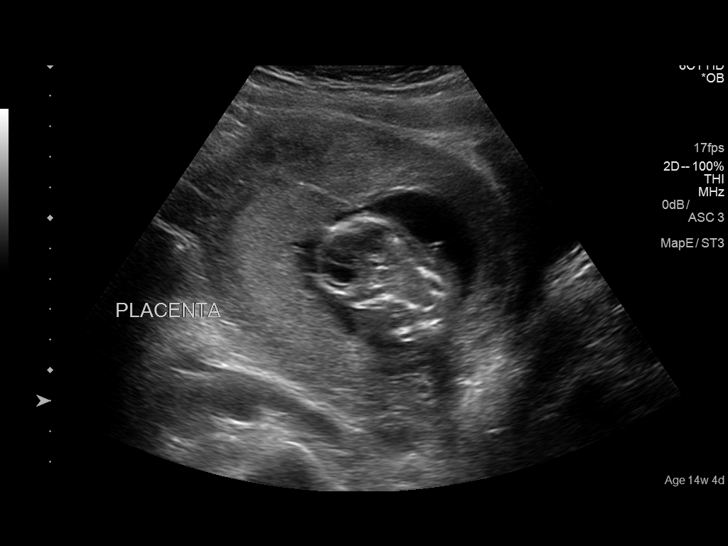
[im 17/30]
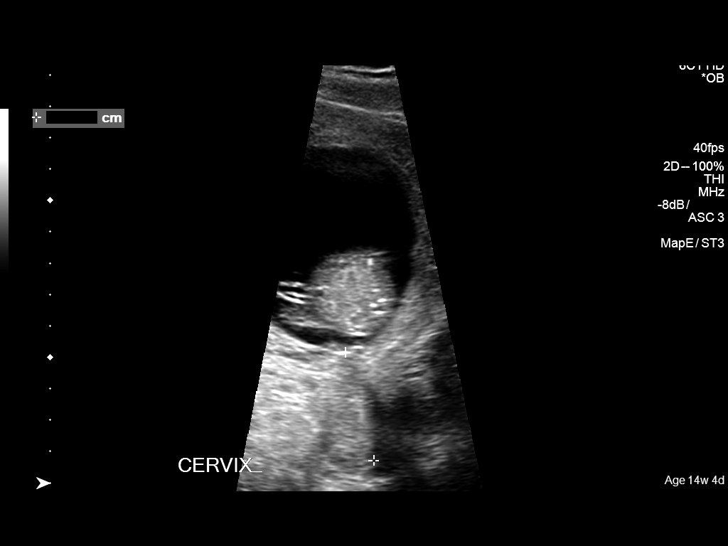
[im 19/30]
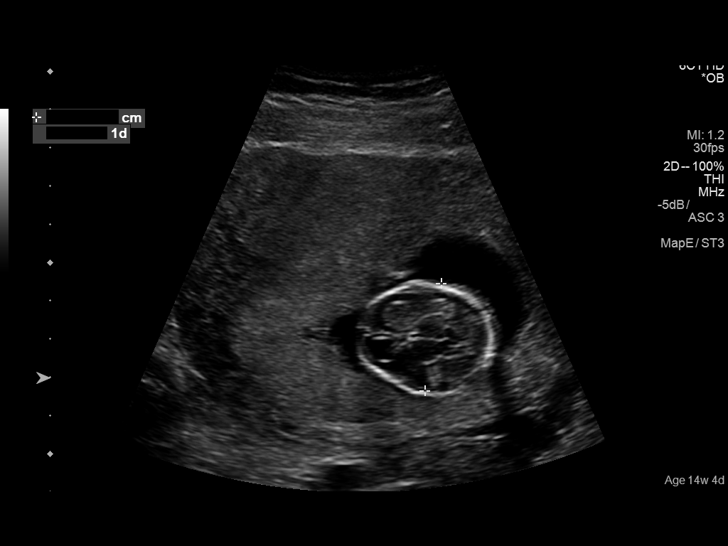
[im 21/30]
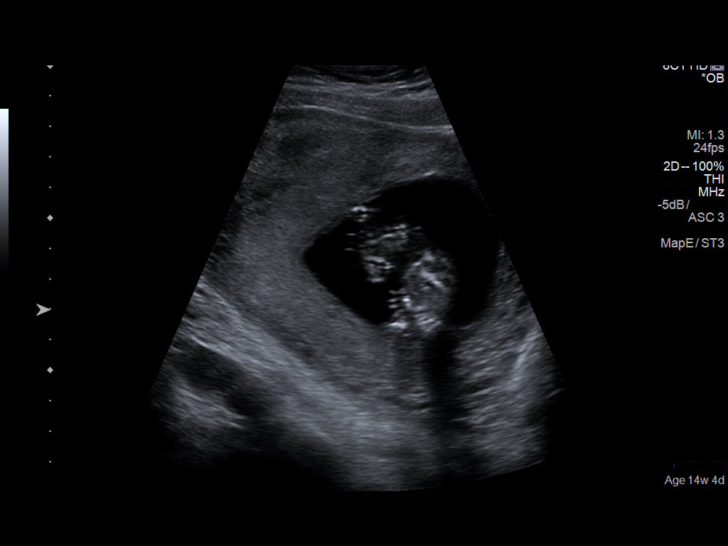
[im 23/30]
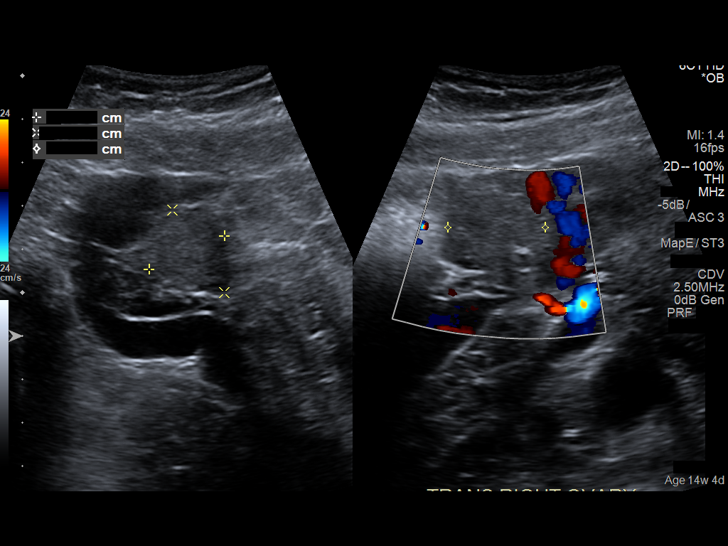
[im 25/30]
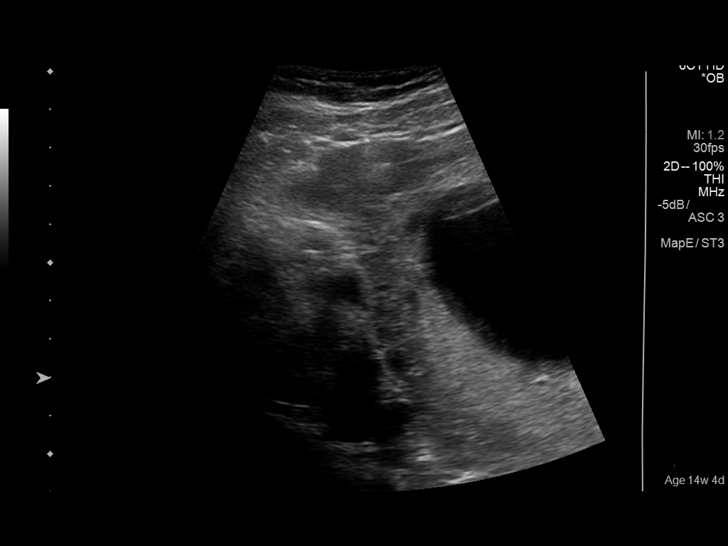
[im 27/30]
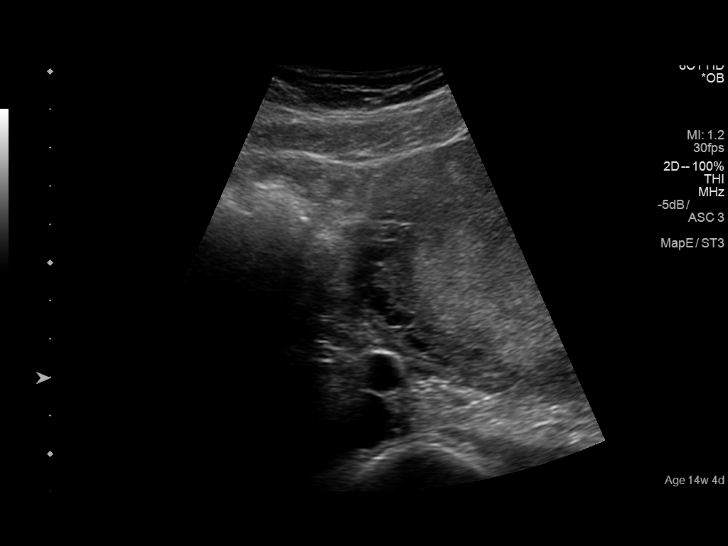
[im 30/30]
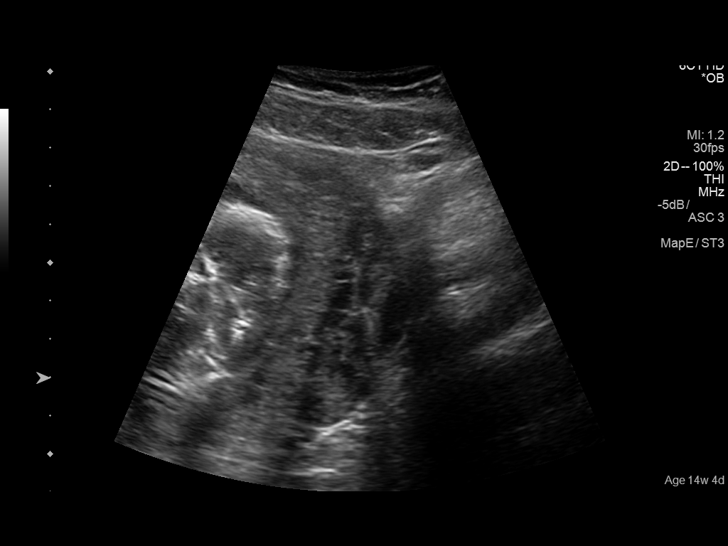

[14 of 28 positions shown; findings below may reference images not displayed]

FINDINGS: Number of Fetuses: 1

Heart Rate:  134 bpm

Movement: Yes

Presentation: Variable.

Placenta: The developing placenta appears posterior in position. No
evidence of placental abruption.

Amniotic Fluid (Subjective):  Within normal limits.

BPD:  2.8cm 15w  1d

MATERNAL FINDINGS:

Cervix:  Appears closed.

Uterus/Adnexae:  No abnormality visualized.
IMPRESSION: 1. Single living intrauterine gestation at 15 weeks 1 day by limited
fetal biometry, with appropriate interval fetal growth. Fetal
cardiac activity is within normal limits.
2. Amniotic fluid volume appears subjectively normal. No evidence of
placental abruption. Maternal cervix appears long and closed.

This exam is performed on an emergent basis and does not
comprehensively evaluate fetal size, dating, or anatomy; follow-up
complete OB US should be considered if further fetal assessment is
warranted.

## 2017-08-02 IMAGING — US US OB COMP LESS 14 WK
1 series · 15 of 28 positions shown · non-contrast
Comparison: 09/18/2015

CLINICAL DATA: Twelve weeks pregnant, bleeding and cramping.

EXAM:
OBSTETRIC <14 WK ULTRASOUND
TECHNIQUE: Transabdominal ultrasound was performed for evaluation of the
gestation as well as the maternal uterus and adnexal regions.

[Series 1: us ob comp less 14 wk · 15 of 58 slices shown]
[im 1/58]
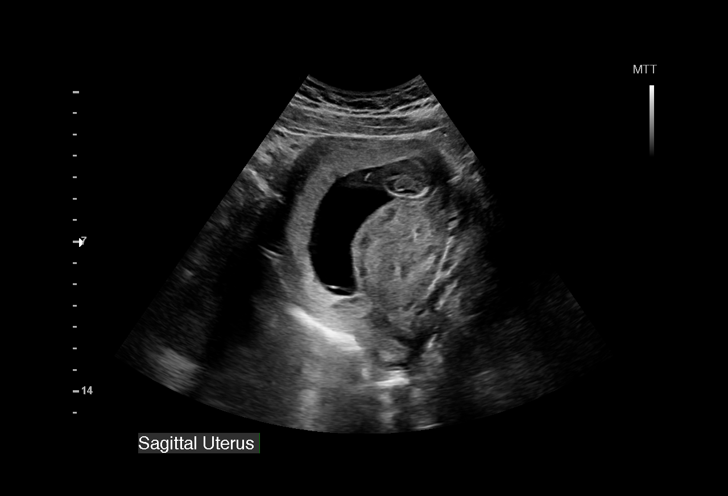
[im 5/58]
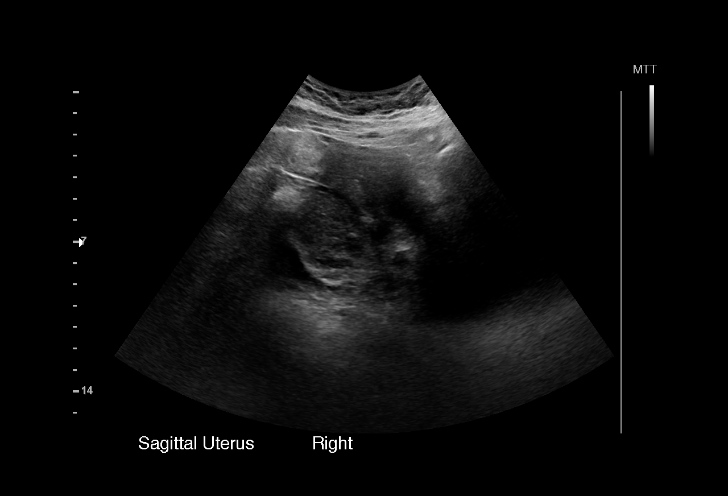
[im 9/58]
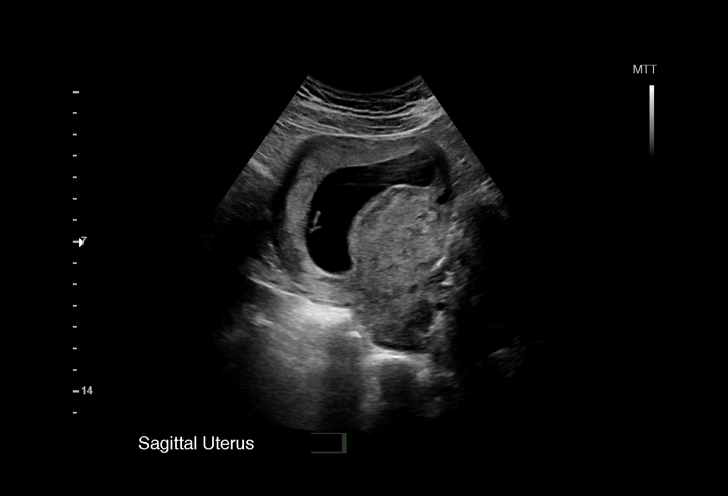
[im 13/58]
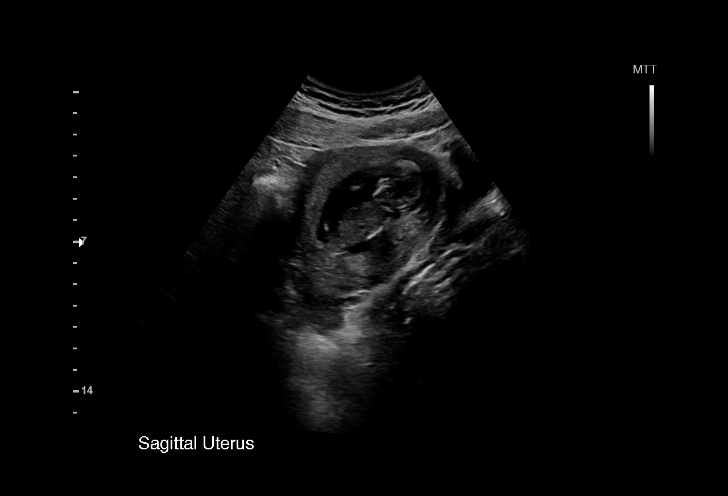
[im 17/58]
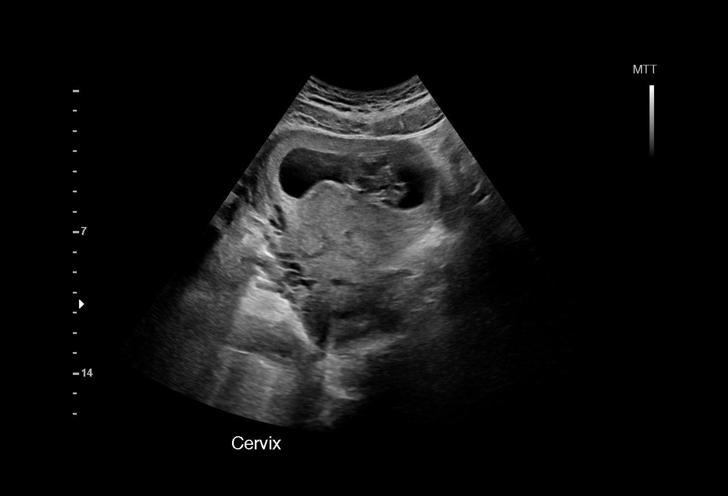
[im 22/58]
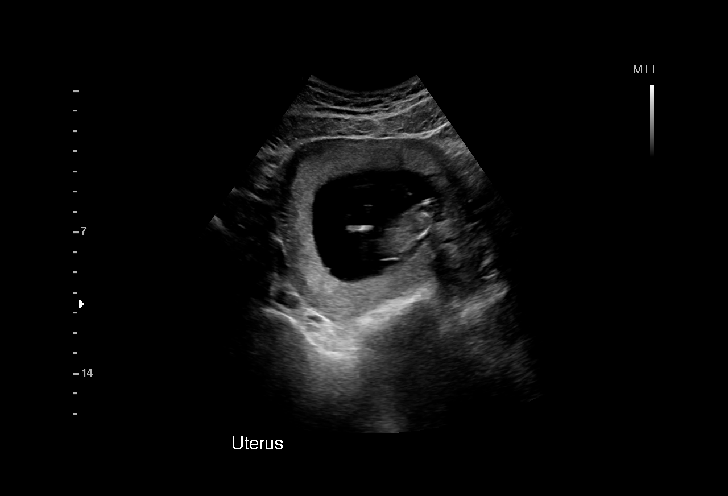
[im 26/58]
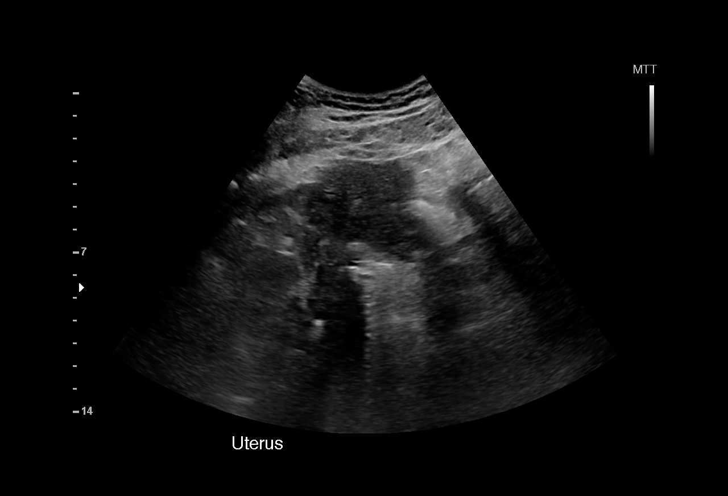
[im 30/58]
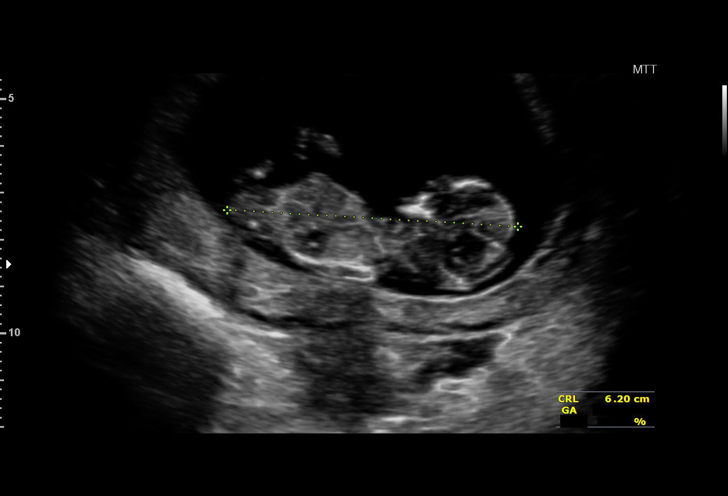
[im 32/58]
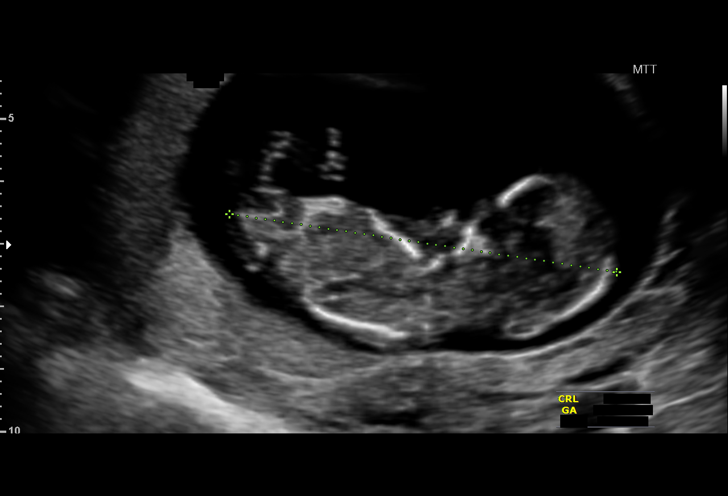
[im 36/58]
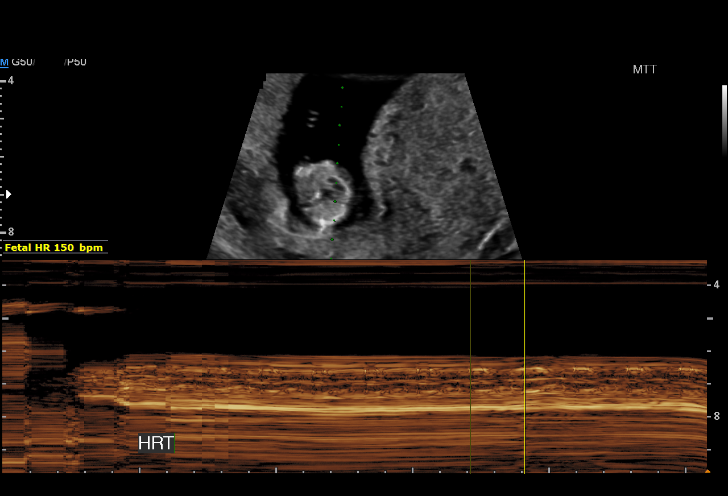
[im 41/58]
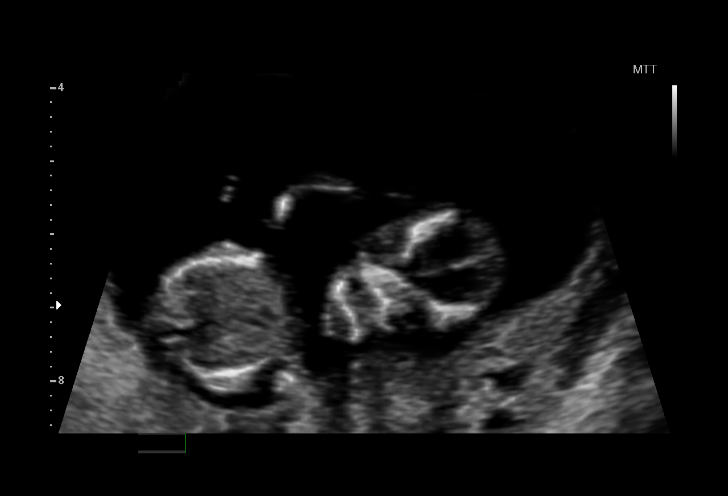
[im 45/58]
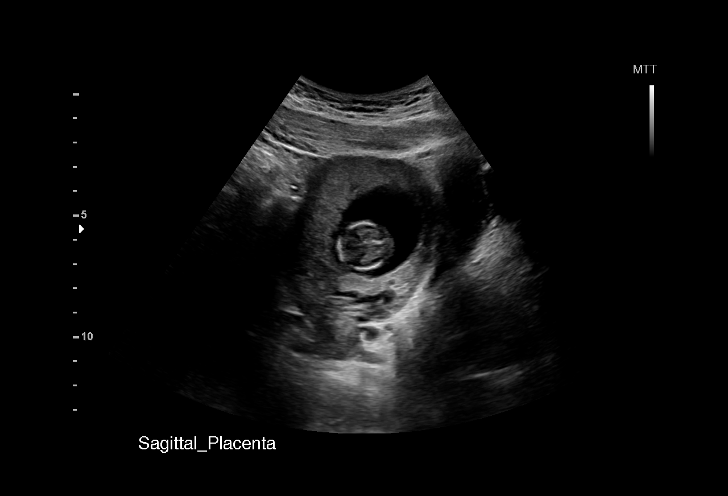
[im 49/58]
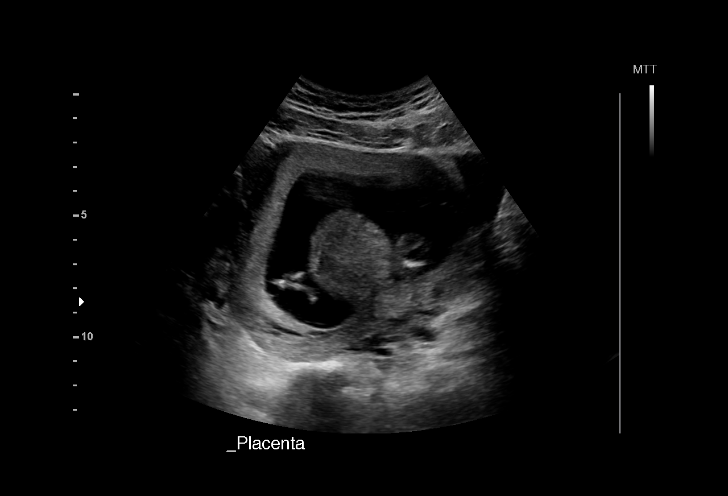
[im 53/58]
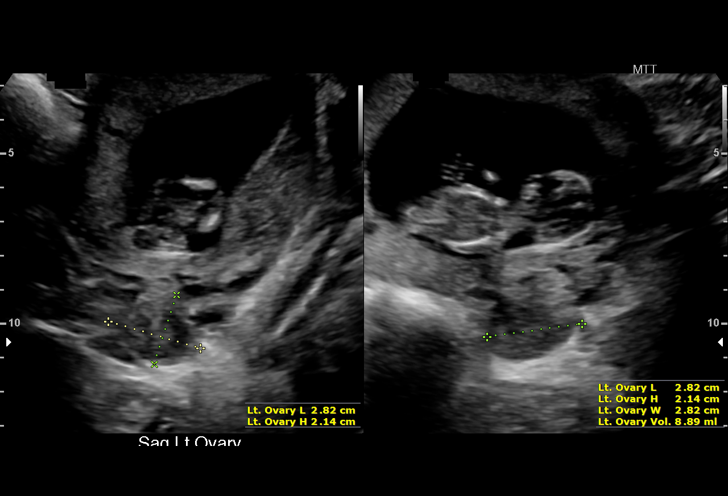
[im 58/58]
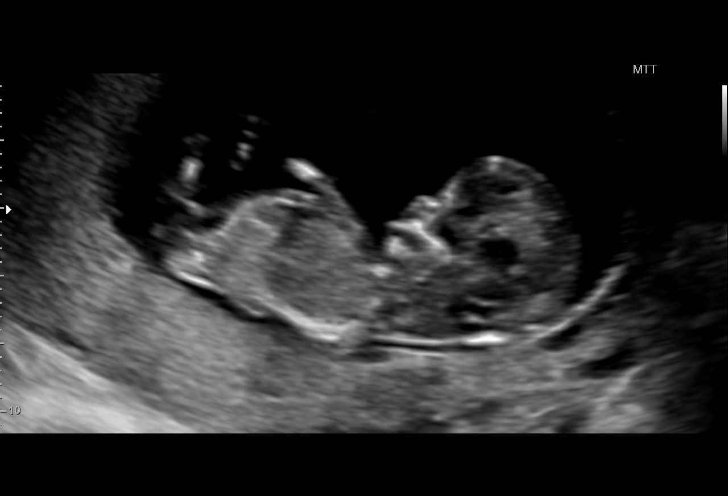

[15 of 28 positions shown; findings below may reference images not displayed]

FINDINGS: Intrauterine gestational sac: Visualized/normal in shape.

Yolk sac:  Not visualized

Embryo:  Visualized

Cardiac Activity: Visualized

Heart Rate: 150 bpm

CRL:   6.04 cm  mm   12 w 4 d                  US EDC: 05/07/2016

Maternal uterus/adnexae: Single viable intrauterine pregnancy with
an estimated gestational age 12 weeks 4 days by crown-rump length.
Cardiac activity detected with a heart rate of 150 beats per minute.
Trace subchorionic hemorrhage suspected. Ovaries appear normal. No
free fluid.
IMPRESSION: Single viable 12 week 4 day IUP.

## 2017-08-16 IMAGING — CR DG CHEST 2V
2 series · 2 of 2 positions shown · non-contrast
Comparison: None.

CLINICAL DATA: Altered mental status

EXAM:
CHEST  2 VIEW

[w chest lat]
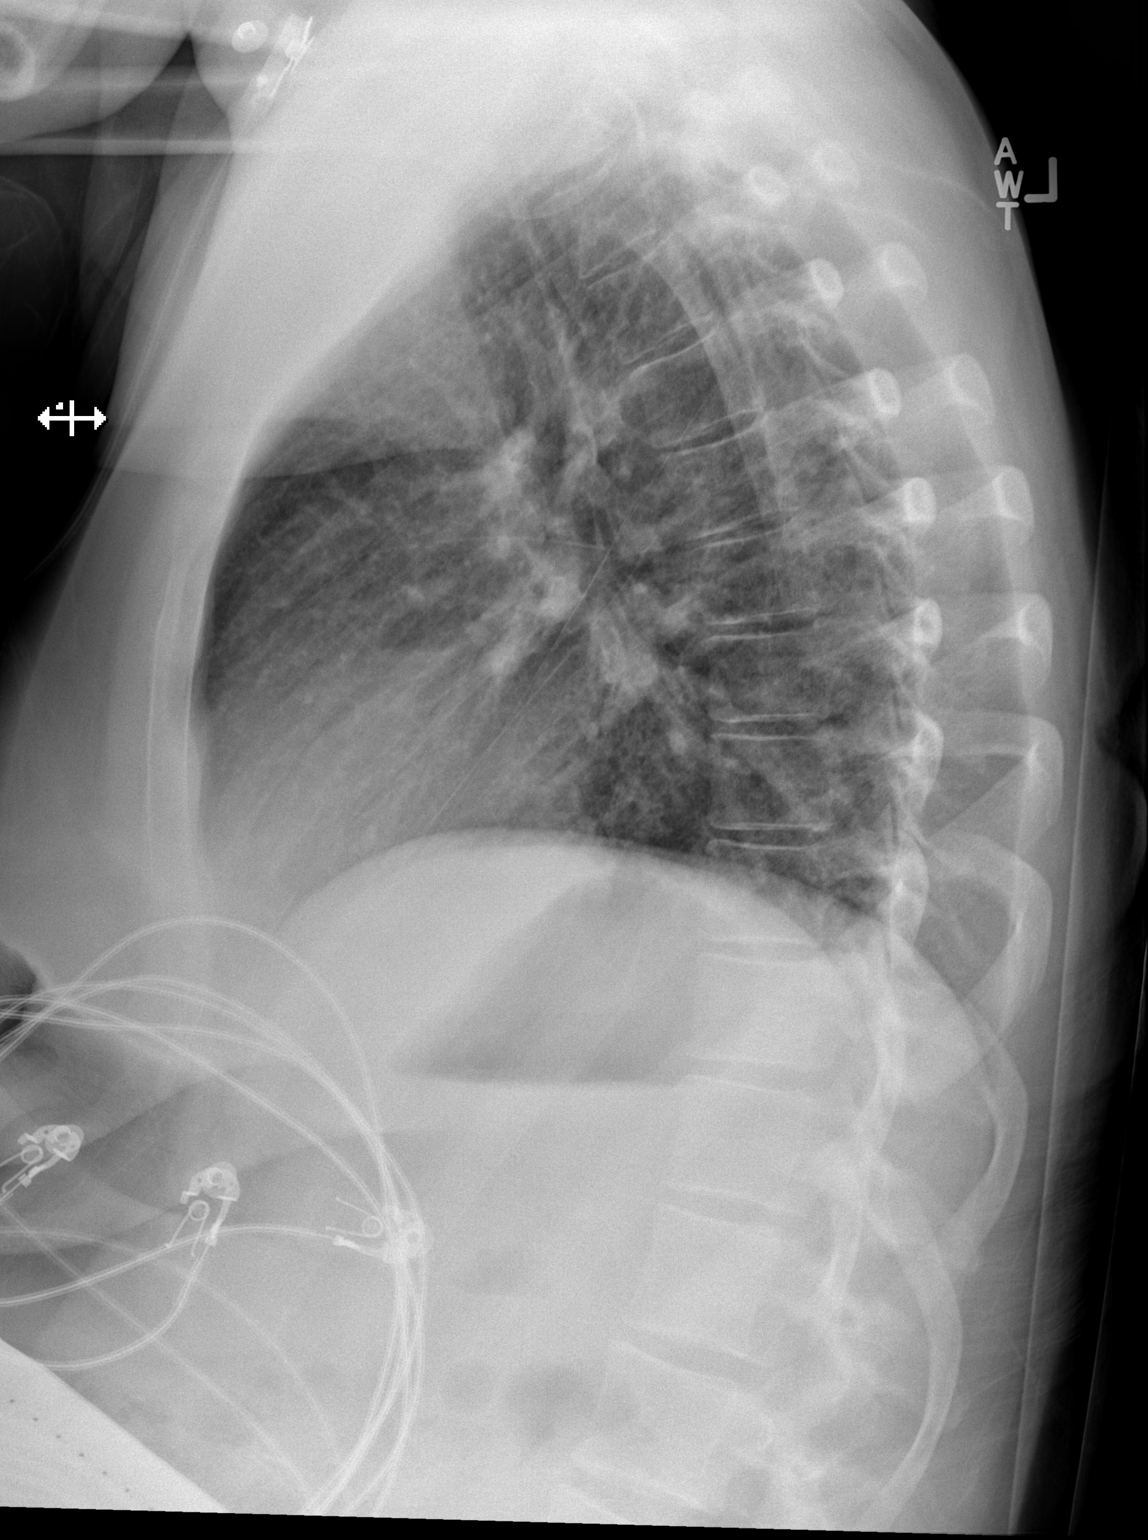

[x chest ap]
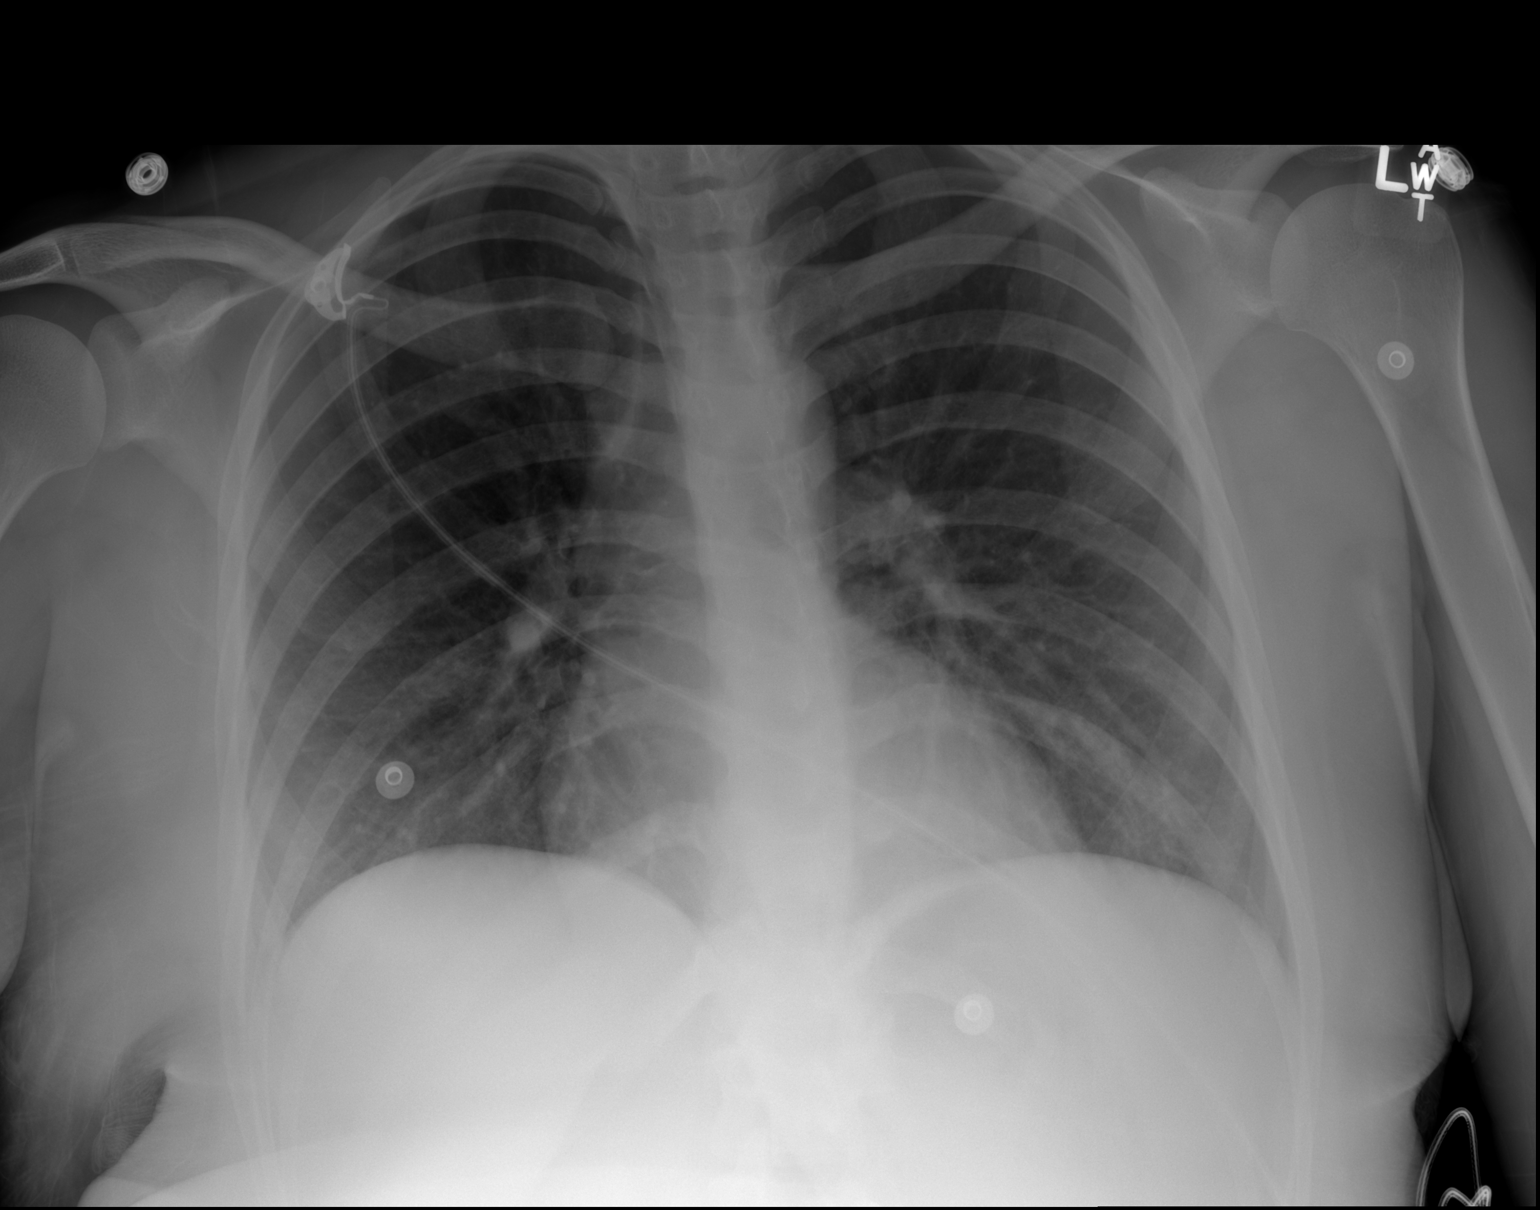

[2 of 2 positions shown; findings below may reference images not displayed]

FINDINGS: Lungs are clear. Heart size and pulmonary vascularity are normal. No
adenopathy. No bone lesions.
IMPRESSION: No edema or consolidation.

## 2019-02-27 ENCOUNTER — Encounter: Payer: Self-pay | Admitting: *Deleted

## 2019-05-01 ENCOUNTER — Encounter: Payer: Self-pay | Admitting: *Deleted
